# Patient Record
Sex: Male | Born: 1950 | Race: White | Hispanic: No | Marital: Married | Smoking: Never smoker
Health system: Southern US, Community
[De-identification: ages and names within clinical notes are randomized; demographics above are authoritative.]

## PROBLEM LIST (undated history)

## (undated) DIAGNOSIS — Z8619 Personal history of other infectious and parasitic diseases: Secondary | ICD-10-CM

## (undated) DIAGNOSIS — I2510 Atherosclerotic heart disease of native coronary artery without angina pectoris: Secondary | ICD-10-CM

## (undated) DIAGNOSIS — E785 Hyperlipidemia, unspecified: Secondary | ICD-10-CM

## (undated) DIAGNOSIS — I10 Essential (primary) hypertension: Secondary | ICD-10-CM

## (undated) HISTORY — DX: Personal history of other infectious and parasitic diseases: Z86.19

## (undated) HISTORY — DX: Essential (primary) hypertension: I10

## (undated) HISTORY — DX: Hyperlipidemia, unspecified: E78.5

## (undated) HISTORY — DX: Atherosclerotic heart disease of native coronary artery without angina pectoris: I25.10

---

## 2003-07-17 DIAGNOSIS — K219 Gastro-esophageal reflux disease without esophagitis: Secondary | ICD-10-CM | POA: Insufficient documentation

## 2005-09-04 ENCOUNTER — Ambulatory Visit: Payer: Self-pay | Admitting: Family Medicine

## 2006-07-16 HISTORY — PX: OTHER SURGICAL HISTORY: SHX169

## 2006-11-11 ENCOUNTER — Other Ambulatory Visit: Payer: Self-pay

## 2006-11-11 ENCOUNTER — Inpatient Hospital Stay: Payer: Self-pay | Admitting: Internal Medicine

## 2006-11-11 DIAGNOSIS — I2510 Atherosclerotic heart disease of native coronary artery without angina pectoris: Secondary | ICD-10-CM | POA: Insufficient documentation

## 2006-11-12 ENCOUNTER — Other Ambulatory Visit: Payer: Self-pay

## 2006-11-27 DIAGNOSIS — E7800 Pure hypercholesterolemia, unspecified: Secondary | ICD-10-CM | POA: Insufficient documentation

## 2006-11-27 DIAGNOSIS — I252 Old myocardial infarction: Secondary | ICD-10-CM | POA: Insufficient documentation

## 2006-12-11 ENCOUNTER — Encounter: Payer: Self-pay | Admitting: Cardiology

## 2006-12-15 ENCOUNTER — Encounter: Payer: Self-pay | Admitting: Cardiology

## 2007-01-14 ENCOUNTER — Encounter: Payer: Self-pay | Admitting: Cardiology

## 2007-02-14 ENCOUNTER — Encounter: Payer: Self-pay | Admitting: Cardiology

## 2007-03-17 ENCOUNTER — Encounter: Payer: Self-pay | Admitting: Cardiology

## 2009-04-12 ENCOUNTER — Ambulatory Visit: Payer: Self-pay | Admitting: Podiatrist

## 2009-04-18 ENCOUNTER — Ambulatory Visit (HOSPITAL_BASED_OUTPATIENT_CLINIC_OR_DEPARTMENT_OTHER): Admission: RE | Admit: 2009-04-18 | Discharge: 2009-04-19 | Payer: Self-pay | Admitting: Orthopedic Surgery

## 2009-06-25 ENCOUNTER — Emergency Department: Payer: Self-pay | Admitting: Emergency Medicine

## 2009-07-13 ENCOUNTER — Inpatient Hospital Stay (HOSPITAL_COMMUNITY): Admission: EM | Admit: 2009-07-13 | Discharge: 2009-07-15 | Payer: Self-pay | Admitting: Family Medicine

## 2009-07-13 ENCOUNTER — Ambulatory Visit: Payer: Self-pay | Admitting: Vascular Surgery

## 2009-07-27 ENCOUNTER — Ambulatory Visit: Payer: Self-pay | Admitting: Vascular Surgery

## 2009-08-10 ENCOUNTER — Ambulatory Visit: Payer: Self-pay | Admitting: Vascular Surgery

## 2009-10-09 IMAGING — CT CT EXTREM LOW W/O CM*L*
1 series · 12 of 14 positions shown, 15 images · non-contrast
Comparison: none

REASON FOR EXAM: [phone]  L calcaneal fracture
COMMENTS:

[Series 2: axial · axial · 0.39mm/px · z∈[-18,+66]mm · 12 of 34 slices shown, 15 images]
[im 3/34  soft-tissue]
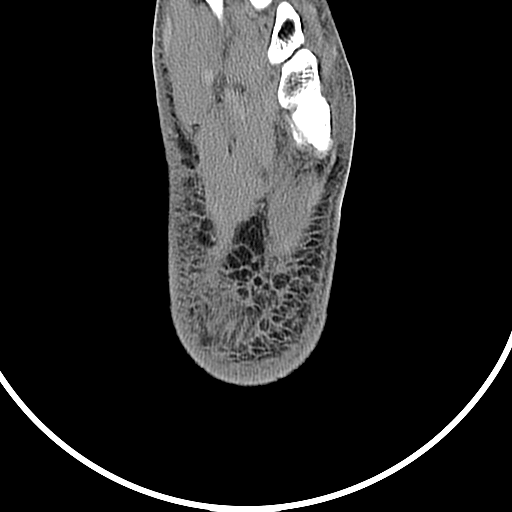
[im 3/34  bone]
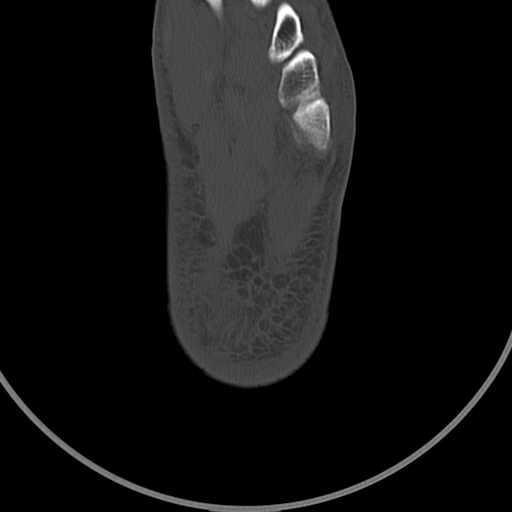
[im 6/34  bone]
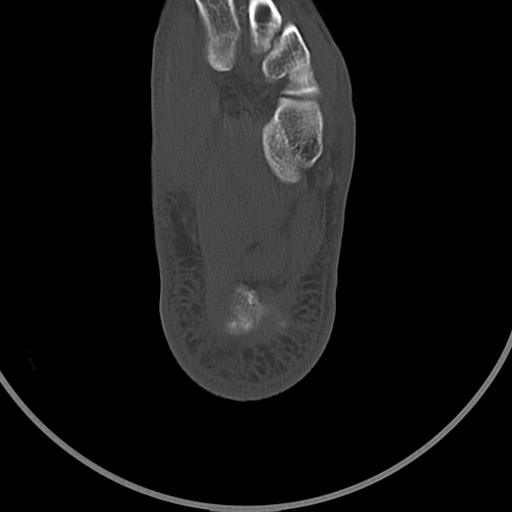
[im 8/34  bone]
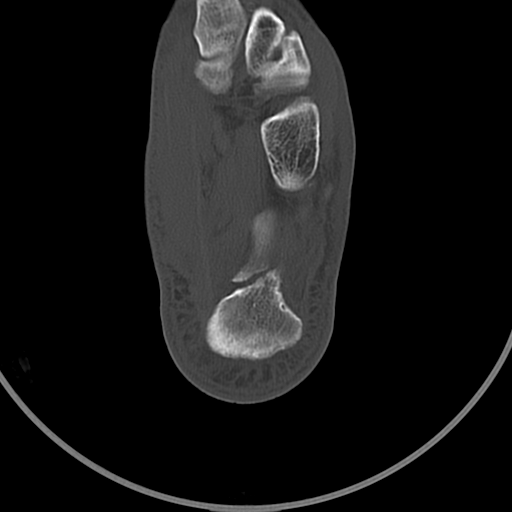
[im 11/34  bone]
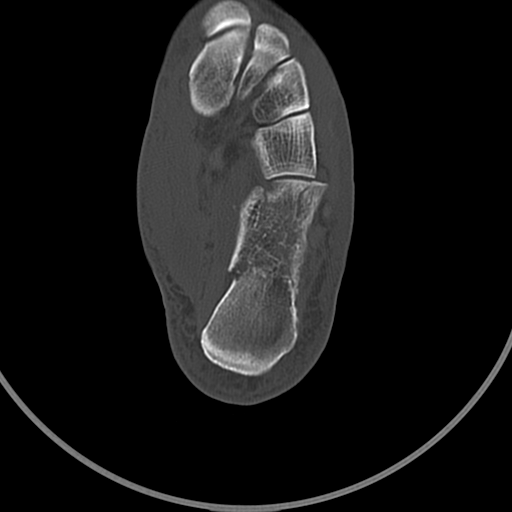
[im 13/34  soft-tissue]
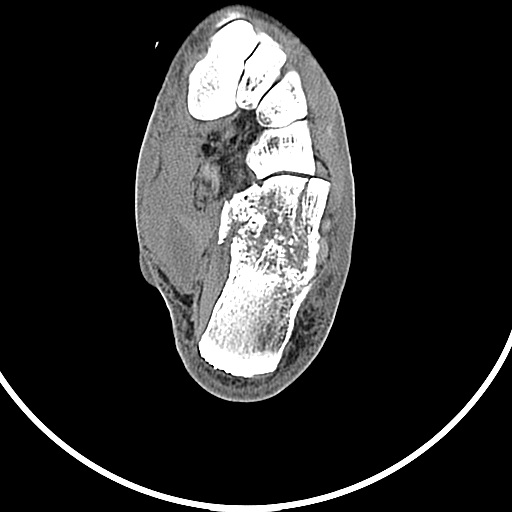
[im 13/34  bone]
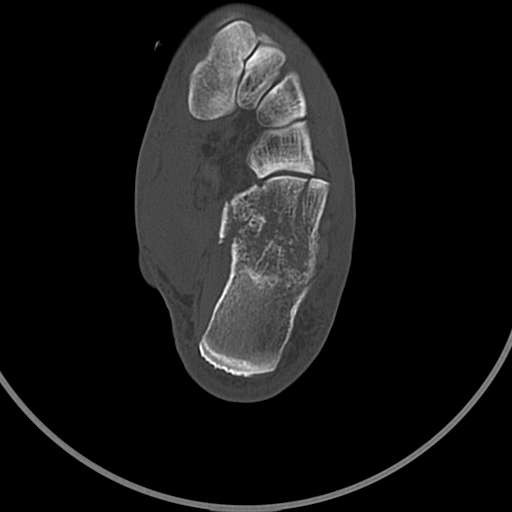
[im 16/34  bone]
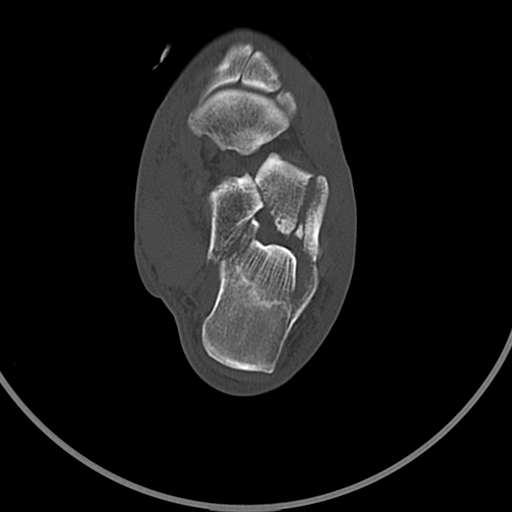
[im 18/34  bone]
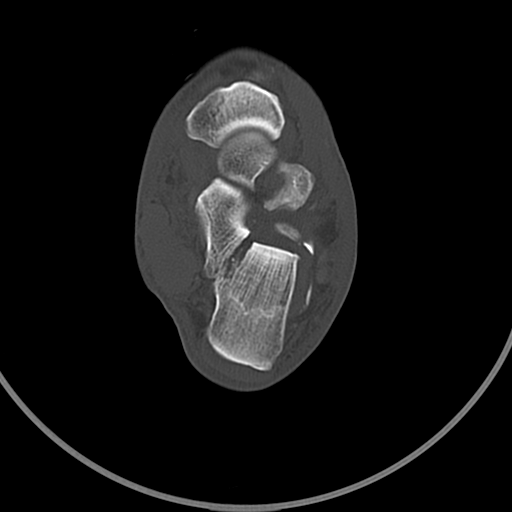
[im 21/34  bone]
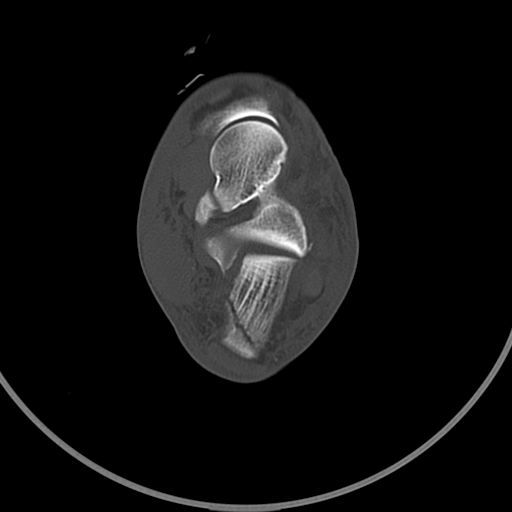
[im 23/34  soft-tissue]
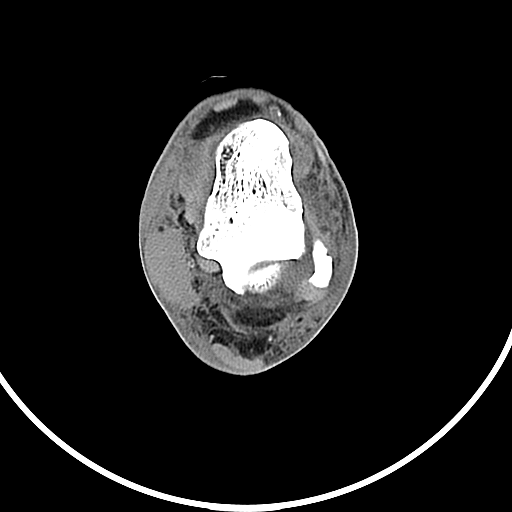
[im 23/34  bone]
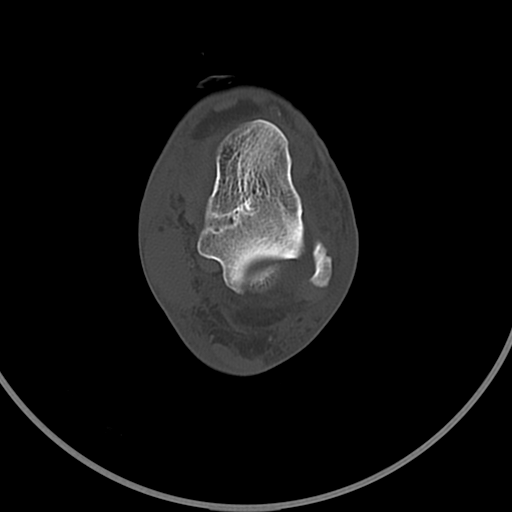
[im 26/34  bone]
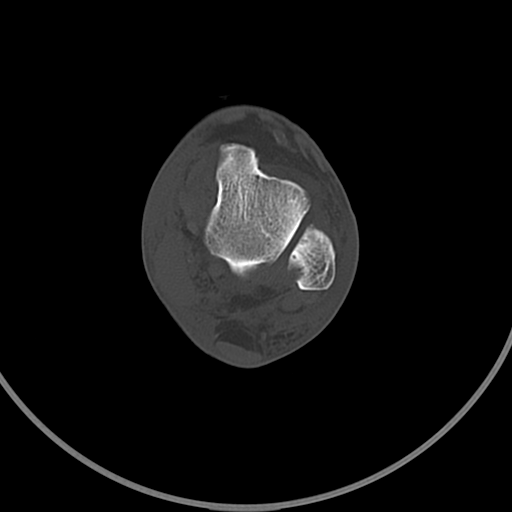
[im 28/34  bone]
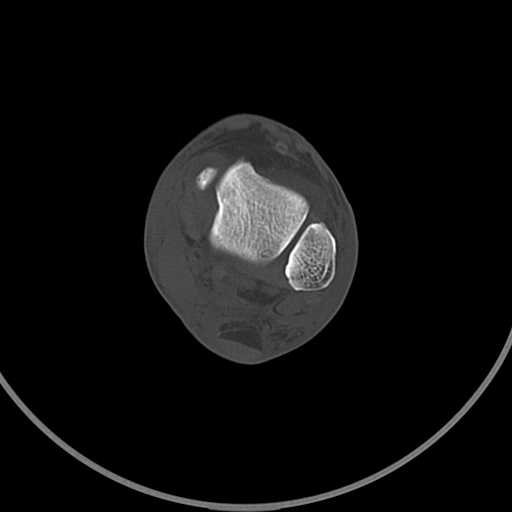
[im 31/34  bone]
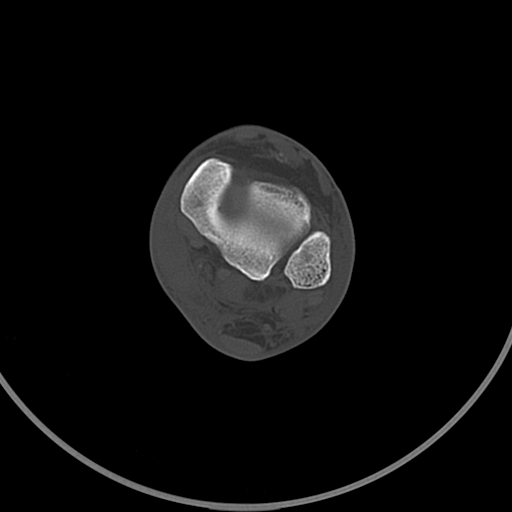

[12 of 14 positions shown; findings below may reference images not displayed]

PROCEDURE:     CT  - CT HEEL (CALCANEOUS) L WO  - April 12, 2009  [DATE]

RESULT:     Axial, sagittal, and coronal images through the heel are
reviewed.

There is a comminuted fracture of the calcaneus. There is an oblique
fracture line through the posterior aspect of the calcaneus. There is a
fracture line through the anterior calcaneal process and another through the
sustentaculum. A complex fracture through the body of the calcaneus is seen.
There is a fracture involving the posterior facet of the calcaneus. The
adjacent talus appears intact as do the tarsal navicular and the cuboid.
There is a hematoma superficial to the flexor tendons along the medial
aspect of the calcaneus.

There is an osteochondral defect of the medial aspect of the talar dome with
subchondral cyst formation.
IMPRESSION: 1. The patient has sustained a complex multipart fracture of the calcaneus.
I do not see evidence of an acute fracture of the talus.
2. There is an osteochondral defect along the medial aspect of the talar
dome with subchondral cyst formation.

## 2010-10-16 LAB — CBC
HCT: 27.4 % — ABNORMAL LOW (ref 39.0–52.0)
HCT: 28.2 % — ABNORMAL LOW (ref 39.0–52.0)
HCT: 31.8 % — ABNORMAL LOW (ref 39.0–52.0)
Hemoglobin: 11.1 g/dL — ABNORMAL LOW (ref 13.0–17.0)
Hemoglobin: 9.3 g/dL — ABNORMAL LOW (ref 13.0–17.0)
MCHC: 34 g/dL (ref 30.0–36.0)
MCV: 88.4 fL (ref 78.0–100.0)
MCV: 89.3 fL (ref 78.0–100.0)
Platelets: 146 10*3/uL — ABNORMAL LOW (ref 150–400)
Platelets: 155 10*3/uL (ref 150–400)
Platelets: 184 10*3/uL (ref 150–400)
RDW: 12.7 % (ref 11.5–15.5)
RDW: 12.8 % (ref 11.5–15.5)
RDW: 13.1 % (ref 11.5–15.5)

## 2010-10-16 LAB — TYPE AND SCREEN
ABO/RH(D): B POS
Antibody Screen: NEGATIVE

## 2010-10-16 LAB — BASIC METABOLIC PANEL
BUN: 7 mg/dL (ref 6–23)
BUN: 8 mg/dL (ref 6–23)
BUN: 9 mg/dL (ref 6–23)
CO2: 26 mEq/L (ref 19–32)
Calcium: 8.9 mg/dL (ref 8.4–10.5)
Chloride: 105 mEq/L (ref 96–112)
Chloride: 106 mEq/L (ref 96–112)
Creatinine, Ser: 0.9 mg/dL (ref 0.4–1.5)
GFR calc non Af Amer: >60 mL/min (ref >60)
GFR calc non Af Amer: >60 mL/min (ref >60)
Glucose, Bld: 121 mg/dL — ABNORMAL HIGH (ref 70–99)
Glucose, Bld: 159 mg/dL — ABNORMAL HIGH (ref 70–99)
Glucose, Bld: 96 mg/dL (ref 70–99)
Potassium: 3.6 mEq/L (ref 3.5–5.1)
Potassium: 3.9 mEq/L (ref 3.5–5.1)
Potassium: 3.9 mEq/L (ref 3.5–5.1)
Sodium: 137 mEq/L (ref 135–145)
Sodium: 140 mEq/L (ref 135–145)

## 2010-10-16 LAB — DIFFERENTIAL
Eosinophils Absolute: 0.1 10*3/uL (ref 0.0–0.7)
Eosinophils Relative: 1 % (ref 0–5)
Lymphocytes Relative: 23 % (ref 12–46)
Lymphs Abs: 3.2 10*3/uL (ref 0.7–4.0)
Monocytes Absolute: 1 10*3/uL (ref 0.1–1.0)

## 2010-10-16 LAB — PROTIME-INR: Prothrombin Time: 12.8 seconds (ref 11.6–15.2)

## 2010-10-16 LAB — TISSUE CULTURE

## 2010-10-16 LAB — ABO/RH: ABO/RH(D): B POS

## 2010-10-19 LAB — BASIC METABOLIC PANEL
BUN: 15 mg/dL (ref 6–23)
GFR calc Af Amer: >60 mL/min (ref >60)
GFR calc non Af Amer: >60 mL/min (ref >60)
Potassium: 4.1 mEq/L (ref 3.5–5.1)

## 2010-10-19 LAB — POCT HEMOGLOBIN-HEMACUE: Hemoglobin: 13.8 g/dL (ref 13.0–17.0)

## 2010-11-28 NOTE — Assessment & Plan Note (Signed)
OFFICE VISIT   Derrick Stone, Derrick Stone  DOB:  04/26/1951                                       07/27/2009  CHART#:[id number]   The patient returns for followup today.  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  On exam today, the  incision is healing well.  There is a small area of separation in the  central portion and there has been a small amount of serous drainage  from this but overall the incision looks like it is healing well.  There  is no surrounding erythema.  There is no pulsatile mass within it at  this point.  He has been doing physical therapy and recovering some from  his previous calcaneus fracture.  Overall he appears well.  He will  follow up with me in 2 weeks to consider suture removal at that time.     Janetta Hora. Fields, MD  Electronically Signed   CEF/MEDQ  D:  07/27/2009  T:  07/28/2009  Job:  [id number]   cc:   Toni Arthurs, MD

## 2010-11-28 NOTE — Assessment & Plan Note (Signed)
OFFICE VISIT   Derrick Stone, Derrick Stone  DOB:  July 02, 1951                                       08/10/2009  CHART#:[id number]   The patient returns for follow-up today.  Previous underwent ligation of  his left posterior tibial artery for tibial artery pseudoaneurysm.  On  exam today the incision is essentially healed.  There is a small central  area of separation from where the skin edges had necrosed.  Overall the  wound appears to be healing well and should be completely healed within  a few weeks.  Sutures were removed today.  He has had no further  episodes of bleeding.  There is no pulsatile mass in this area at this  point.  He will follow up on an as-needed basis.     Janetta Hora. Fields, MD  Electronically Signed   CEF/MEDQ  D:  08/10/2009  T:  08/11/2009  Job:  [id number]   cc:   Toni Arthurs, MD

## 2013-10-09 DIAGNOSIS — I10 Essential (primary) hypertension: Secondary | ICD-10-CM | POA: Insufficient documentation

## 2013-10-27 LAB — HM COLONOSCOPY

## 2014-07-16 HISTORY — PX: HERNIA REPAIR: SHX51

## 2017-04-10 DIAGNOSIS — K222 Esophageal obstruction: Secondary | ICD-10-CM | POA: Insufficient documentation

## 2017-04-12 ENCOUNTER — Encounter: Payer: Self-pay | Admitting: Family Medicine

## 2017-04-12 ENCOUNTER — Ambulatory Visit (INDEPENDENT_AMBULATORY_CARE_PROVIDER_SITE_OTHER): Payer: BLUE CROSS/BLUE SHIELD | Admitting: Family Medicine

## 2017-04-12 VITALS — BP 110/70 | HR 75 | Temp 98.5°F | Resp 16 | Ht 74.0 in | Wt 235.0 lb

## 2017-04-12 DIAGNOSIS — Z1159 Encounter for screening for other viral diseases: Secondary | ICD-10-CM | POA: Diagnosis not present

## 2017-04-12 DIAGNOSIS — Z125 Encounter for screening for malignant neoplasm of prostate: Secondary | ICD-10-CM

## 2017-04-12 DIAGNOSIS — Z23 Encounter for immunization: Secondary | ICD-10-CM

## 2017-04-12 DIAGNOSIS — E7800 Pure hypercholesterolemia, unspecified: Secondary | ICD-10-CM | POA: Diagnosis not present

## 2017-04-12 DIAGNOSIS — R739 Hyperglycemia, unspecified: Secondary | ICD-10-CM | POA: Diagnosis not present

## 2017-04-12 DIAGNOSIS — Z0000 Encounter for general adult medical examination without abnormal findings: Secondary | ICD-10-CM | POA: Diagnosis not present

## 2017-04-12 DIAGNOSIS — I2510 Atherosclerotic heart disease of native coronary artery without angina pectoris: Secondary | ICD-10-CM

## 2017-04-12 NOTE — Progress Notes (Signed)
Patient: Derrick Stone, Male    DOB: [birth year], 66 y.o.   MRN: [id number] Visit Date: 04/12/2017  Today's Provider: Lelon Huh, MD   Chief Complaint  Patient presents with  . Annual Exam  . Hyperlipidemia   Subjective:   Patient presents to re-establish care. He was last seen 07-23-2013. Have completed new medical history.    Annual physical exam Derrick Stone is a 66 y.o. male who presents today for health maintenance and complete physical. He feels well. He reports exercising none. He reports he is sleeping well.  ---------------------------------------------------------------- CAD Followed by Dr. Ubaldo Glassing. Is doing well. No chest pains or dyspnea. .  Hypercholesterolemia Followed by cardiology, lipids last check in January with LDL at 85  He did have mildly elevated fasting glucose of 111 on labs done in January by Dr. Ubaldo Glassing.    Review of Systems  Constitutional: Negative for chills, diaphoresis and fever.  HENT: Negative for congestion, ear discharge, ear pain, hearing loss, nosebleeds, sore throat and tinnitus.   Eyes: Negative for photophobia, pain, discharge and redness.  Respiratory: Negative for cough, shortness of breath, wheezing and stridor.   Cardiovascular: Negative for chest pain, palpitations and leg swelling.  Gastrointestinal: Negative for abdominal pain, blood in stool, constipation, diarrhea, nausea and vomiting.  Endocrine: Negative for polydipsia.  Genitourinary: Negative for dysuria, flank pain, frequency, hematuria and urgency.  Musculoskeletal: Negative for back pain, myalgias and neck pain.  Skin: Negative for rash.  Allergic/Immunologic: Negative for environmental allergies.  Neurological: Negative for dizziness, tremors, seizures, weakness and headaches.  Hematological: Does not bruise/bleed easily.  Psychiatric/Behavioral: Negative for hallucinations and suicidal ideas. The patient is not nervous/anxious.   All other systems reviewed and  are negative.   Social History      He  reports that he has never smoked. He has never used smokeless tobacco. He reports that he does not drink alcohol or use drugs.       Social History   Social History  . Marital status: Married    Spouse name: N/A  . Number of children: 2  . Years of education: N/A   Occupational History  .      Part time with [employer] with father-in-law   Social History Main Topics  . Smoking status: Never Smoker  . Smokeless tobacco: Never Used  . Alcohol use No  . Drug use: No  . Sexual activity: Not Asked   Other Topics Concern  . None   Social History Narrative  . None    Past Medical History:  Diagnosis Date  . CAD (coronary artery disease)   . History of chickenpox   . History of mumps   . Hyperlipidemia      Patient Active Problem List   Diagnosis Date Noted  . Stricture and stenosis of esophagus 04/10/2017  . Pure hypercholesterolemia 11/27/2006  . 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 Coronary atherosclerosis of native coronary artery 11/11/2006  . Acid reflux 07/17/2003    Past Surgical History:  Procedure Laterality Date  . Heart artery stent  2008   x2    Family History        Family Status  Relation Status  . Mother Alive  . Father Alive       pre-diabetic  . Brother Alive        His family history includes Melanoma in his mother; Testicular cancer in his brother.     No Known Allergies  Current Outpatient Prescriptions:  .  atorvastatin (LIPITOR) 20 MG tablet, Take 1 tablet by mouth daily., Disp: , Rfl:  .  clopidogrel (PLAVIX) 75 MG tablet, Take 1 tablet by mouth daily., Disp: , Rfl:  .  metoprolol succinate (TOPROL XL) 50 MG 24 hr tablet, Take 1 tablet by mouth daily., Disp: , Rfl:  .  Omega-3 Fatty Acids (FISH OIL PO), Take 1 capsule by mouth daily., Disp: , Rfl:  .  omeprazole (PRILOSEC OTC) 20 MG tablet, Take 1 tablet by mouth daily., Disp: , Rfl:    Patient Care Team: Birdie Sons, MD as  PCP - General (Family Medicine) Teodoro Spray, MD as Consulting Physician (Cardiology) Ralene Bathe, MD (Dermatology) Lorelee Cover., MD (Ophthalmology)      Objective:   Vitals: BP 110/70 (BP Location: Right Arm, Patient Position: Sitting, Cuff Size: Large)   Pulse 75   Temp 98.5 F (36.9 C) (Oral)   Resp 16   Ht 6\' 2"  (1.88 m)   Wt 235 lb (106.6 kg)   SpO2 98%   BMI 30.17 kg/m    Vitals:   04/12/17 1403  BP: 110/70  Pulse: 75  Resp: 16  Temp: 98.5 F (36.9 C)  TempSrc: Oral  SpO2: 98%  Weight: 235 lb (106.6 kg)  Height: 6\' 2"  (1.88 m)     Physical Exam   General Appearance:    Alert, cooperative, no distress, appears stated age  Head:    Normocephalic, without obvious abnormality, atraumatic  Eyes:    PERRL, conjunctiva/corneas clear, EOM's intact, fundi    benign, both eyes       Ears:    Normal TM's and external ear canals, both ears  Nose:   Nares normal, septum midline, mucosa normal, no drainage   or sinus tenderness  Throat:   Lips, mucosa, and tongue normal; teeth and gums normal  Neck:   Supple, symmetrical, trachea midline, no adenopathy;       thyroid:  No enlargement/tenderness/nodules; no carotid   bruit or JVD  Back:     Symmetric, no curvature, ROM normal, no CVA tenderness  Lungs:     Clear to auscultation bilaterally, respirations unlabored  Chest wall:    No tenderness or deformity  Heart:    Regular rate and rhythm, S1 and S2 normal, no murmur, rub   or gallop  Abdomen:     Soft, non-tender, bowel sounds active all four quadrants,    no masses, no organomegaly  Genitalia:    deferred  Rectal:    deferred  Extremities:   Extremities normal, atraumatic, no cyanosis or edema  Pulses:   2+ and symmetric all extremities  Skin:   Skin color, texture, turgor normal, no rashes or lesions  Lymph nodes:   Cervical, supraclavicular, and axillary nodes normal  Neurologic:   CNII-XII intact. Normal strength, sensation and reflexes       throughout   Depression Screen PHQ 2/9 Scores 04/12/2017  PHQ - 2 Score 0  PHQ- 9 Score 0      Assessment & Plan:     Routine Health Maintenance and Physical Exam  Exercise Activities and Dietary recommendations Goals    None      Immunization History  Administered Date(s) Administered  . Tdap 04/04/2009  . Zoster 07/23/2013    Health Maintenance  Topic Date Due  . Hepatitis C Screening  1951/01/04  . HIV Screening  06/06/1968  . INFLUENZA VACCINE  02/13/2018 (Originally 02/13/2017)  .  TETANUS/TDAP  04/05/2019  . COLONOSCOPY  10/28/2023     Discussed health benefits of physical activity, and encouraged him to engage in regular exercise appropriate for his age and condition.    --------------------------------------------------------------------  1. Annual physical exam Generally doing well, is mildly obese and is to work on losing weight with improving diet and increasing exercise.   2. Atherosclerosis of native coronary artery of native heart without angina pectoris Asymptomatic. Compliant with medication.  Continue aggressive risk factor modification.  Continue regular follow up Dr. Ubaldo Glassing.   3. Pure hypercholesterolemia He is tolerating atorvastatin well with no adverse effects.    4. Need for influenza vaccination Flu vaccine  5. Need for shingles vaccine Shingrix #1  6. Hyperglycemia  - Hemoglobin A1c  7. Prostate cancer screening  - PSA  8. Need for hepatitis C screening test  - Hepatitis C antibody   Lelon Huh, MD  [facility]

## 2017-04-13 ENCOUNTER — Encounter: Payer: Self-pay | Admitting: Family Medicine

## 2017-04-13 DIAGNOSIS — R7303 Prediabetes: Secondary | ICD-10-CM | POA: Insufficient documentation

## 2017-04-13 LAB — HEMOGLOBIN A1C
Hgb A1c MFr Bld: 6 % of total Hgb — ABNORMAL HIGH (ref <5.7)
Mean Plasma Glucose: 126 (calc)
eAG (mmol/L): 7 (calc)

## 2017-04-13 LAB — HEPATITIS C ANTIBODY
Hepatitis C Ab: NONREACTIVE
SIGNAL TO CUT-OFF: 0.01 (ref <1.00)

## 2017-04-13 LAB — PSA: PSA: 0.5 ng/mL (ref <=4.0)

## 2017-04-15 ENCOUNTER — Telehealth: Payer: Self-pay

## 2017-04-15 ENCOUNTER — Encounter: Payer: Self-pay | Admitting: Family Medicine

## 2017-04-15 NOTE — Telephone Encounter (Signed)
-----   Message from Birdie Sons, MD sent at 04/13/2017  8:30 AM EDT ----- PSA is normal. Average blood sugar is elevated at 126, which is 'Pre-diabetes' need to avoid sweets and starchy foods, exercise 150 minutes a week. Try to lose 10-15 pounds over the next 6 months. Need to check A1c yearly.

## 2017-04-15 NOTE — Telephone Encounter (Signed)
Patient advised.KW 

## 2017-04-15 NOTE — Telephone Encounter (Signed)
[id number] 

## 2017-06-12 ENCOUNTER — Ambulatory Visit (INDEPENDENT_AMBULATORY_CARE_PROVIDER_SITE_OTHER): Payer: BLUE CROSS/BLUE SHIELD | Admitting: Family Medicine

## 2017-06-12 DIAGNOSIS — Z23 Encounter for immunization: Secondary | ICD-10-CM | POA: Diagnosis not present

## 2017-06-16 NOTE — Progress Notes (Signed)
Vaccine administration only. No E&M service today.   

## 2017-06-19 ENCOUNTER — Encounter: Payer: Self-pay | Admitting: Family Medicine

## 2017-06-19 ENCOUNTER — Ambulatory Visit: Payer: BLUE CROSS/BLUE SHIELD | Admitting: Family Medicine

## 2017-06-19 VITALS — BP 126/70 | HR 69 | Temp 98.6°F | Resp 15 | Wt 224.8 lb

## 2017-06-19 DIAGNOSIS — R05 Cough: Secondary | ICD-10-CM | POA: Diagnosis not present

## 2017-06-19 DIAGNOSIS — J40 Bronchitis, not specified as acute or chronic: Secondary | ICD-10-CM | POA: Diagnosis not present

## 2017-06-19 DIAGNOSIS — R059 Cough, unspecified: Secondary | ICD-10-CM

## 2017-06-19 MED ORDER — AZITHROMYCIN 250 MG PO TABS
ORAL_TABLET | ORAL | 0 refills | Status: AC
Start: 2017-06-19 — End: 2017-06-24

## 2017-06-19 MED ORDER — HYDROCODONE-HOMATROPINE 5-1.5 MG/5ML PO SYRP: 5.0000 mL | ORAL_SOLUTION | Freq: Three times a day (TID) | ORAL | 0 refills | Status: DC | PRN

## 2017-06-19 NOTE — Progress Notes (Addendum)
Patient ID: Derrick Stone, male   DOB: 1951-02-05, 66 y.o.   MRN: [id number]        Patient: Derrick Stone Male    DOB: November 04, 1950   66 y.o.   MRN: [id number] Visit Date: 06/19/2017  Today's Provider: Lelon Huh, MD   Chief Complaint  Patient presents with  . URI   Subjective:    URI   This is a new problem. The current episode started in the past 7 days. The problem has been unchanged. There has been no fever. Associated symptoms include congestion, coughing and rhinorrhea. Pertinent negatives include no abdominal pain, chest pain, diarrhea, dysuria, ear pain, headaches, joint pain, joint swelling, nausea, neck pain, plugged ear sensation, rash, sinus pain, sneezing, sore throat, swollen glands, vomiting or wheezing. He has tried decongestant and increased fluids for the symptoms. The treatment provided no relief.       Allergies  Allergen Reactions  . Nitroglycerin Other (See Comments)    Passed out when given to him once     Current Outpatient Medications:  .  aspirin EC 81 MG tablet, Take by mouth., Disp: , Rfl:  .  atorvastatin (LIPITOR) 40 MG tablet, TAKE 1 TABLET (40 MG TOTAL) BY MOUTH ONCE DAILY., Disp: , Rfl: 5 .  clopidogrel (PLAVIX) 75 MG tablet, Take 1 tablet by mouth daily., Disp: , Rfl:  .  escitalopram (LEXAPRO) 10 MG tablet, TAKE 1 TABLET (10 MG TOTAL) BY MOUTH ONCE DAILY., Disp: , Rfl: 1 .  metoprolol succinate (TOPROL XL) 50 MG 24 hr tablet, Take 1 tablet by mouth daily., Disp: , Rfl:  .  Omega-3 Fatty Acids (FISH OIL PO), Take 1 capsule by mouth daily., Disp: , Rfl:  .  omeprazole (PRILOSEC OTC) 20 MG tablet, Take 1 tablet by mouth daily., Disp: , Rfl:  .  omeprazole (PRILOSEC) 20 MG capsule, TAKE 1 CAPSULE (20 MG TOTAL) BY MOUTH ONCE DAILY., Disp: , Rfl:  .  mometasone (NASONEX) 50 MCG/ACT nasal spray, Place into the nose., Disp: , Rfl:   Review of Systems  HENT: Positive for congestion and rhinorrhea. Negative for ear pain, sinus pain, sneezing and  sore throat.   Respiratory: Positive for cough. Negative for wheezing.   Cardiovascular: Negative for chest pain.  Gastrointestinal: Negative for abdominal pain, diarrhea, nausea and vomiting.  Genitourinary: Negative for dysuria.  Musculoskeletal: Negative for joint pain and neck pain.  Skin: Negative for rash.  Neurological: Negative for headaches.    Social History   Tobacco Use  . Smoking status: Never Smoker  . Smokeless tobacco: Never Used  Substance Use Topics  . Alcohol use: No   Objective:   BP 126/70   Pulse 69   Temp 98.6 F (37 C) (Oral)   Resp 15   Wt 224 lb 12.8 oz (102 kg)   SpO2 99%   BMI 28.86 kg/m  Vitals:   06/19/17 1526  BP: 126/70  Pulse: 69  Resp: 15  Temp: 98.6 F (37 C)  TempSrc: Oral  SpO2: 99%  Weight: 224 lb 12.8 oz (102 kg)     Physical Exam  General Appearance:    Alert, cooperative, no distress  HENT:   ENT exam normal, no neck nodes or sinus tenderness  Eyes:    PERRL, conjunctiva/corneas clear, EOM's intact       Lungs:     Occasional expiratory wheeze, no rales, , respirations unlabored  Heart:    Regular rate and rhythm  Neurologic:  Awake, alert, oriented x 3. No apparent focal neurological           defect.           Assessment & Plan:     1. Cough  - HYDROcodone-homatropine (HYCODAN) 5-1.5 MG/5ML syrup; Take 5 mLs by mouth every 8 (eight) hours as needed for cough.  Dispense: 100 mL; Refill: 0  2. Bronchitis  - azithromycin (ZITHROMAX) 250 MG tablet; 2 by mouth today, then 1 daily for 4 days  Dispense: 6 tablet; Refill: 0  Call if symptoms change or if not rapidly improving.          Lelon Huh, MD  [facility]

## 2017-06-19 NOTE — Progress Notes (Deleted)
Subjective:     Patient ID: Derrick Stone, male   DOB: October 16, 1950, 66 y.o.   MRN: [id number]  URI   This is a new problem. The current episode started in the past 7 days. The problem has been unchanged. There has been no fever. Associated symptoms include congestion, coughing and rhinorrhea. Pertinent negatives include no abdominal pain, chest pain, diarrhea, dysuria, ear pain, headaches, joint pain, joint swelling, nausea, neck pain, plugged ear sensation, rash, sinus pain, sneezing, sore throat, swollen glands, vomiting or wheezing. He has tried decongestant and increased fluids for the symptoms. The treatment provided no relief.     Review of Systems  HENT: Positive for congestion and rhinorrhea. Negative for ear pain, sinus pain, sneezing and sore throat.   Respiratory: Positive for cough. Negative for wheezing.   Cardiovascular: Negative for chest pain.  Gastrointestinal: Negative for abdominal pain, diarrhea, nausea and vomiting.  Genitourinary: Negative for dysuria.  Musculoskeletal: Negative for joint pain and neck pain.  Skin: Negative for rash.  Neurological: Negative for headaches.       Objective:   Physical Exam     Assessment:     ***    Plan:     ***

## 2018-08-08 ENCOUNTER — Ambulatory Visit (INDEPENDENT_AMBULATORY_CARE_PROVIDER_SITE_OTHER): Payer: Medicare Other | Admitting: Family Medicine

## 2018-08-08 DIAGNOSIS — R7303 Prediabetes: Secondary | ICD-10-CM | POA: Diagnosis not present

## 2018-08-08 DIAGNOSIS — I25110 Atherosclerotic heart disease of native coronary artery with unstable angina pectoris: Secondary | ICD-10-CM | POA: Diagnosis not present

## 2018-08-08 DIAGNOSIS — I25810 Atherosclerosis of coronary artery bypass graft(s) without angina pectoris: Secondary | ICD-10-CM | POA: Diagnosis not present

## 2018-08-08 DIAGNOSIS — I10 Essential (primary) hypertension: Secondary | ICD-10-CM | POA: Diagnosis not present

## 2018-08-08 DIAGNOSIS — F418 Other specified anxiety disorders: Secondary | ICD-10-CM | POA: Diagnosis not present

## 2018-08-08 DIAGNOSIS — E782 Mixed hyperlipidemia: Secondary | ICD-10-CM | POA: Diagnosis not present

## 2018-08-08 DIAGNOSIS — Z23 Encounter for immunization: Secondary | ICD-10-CM | POA: Diagnosis not present

## 2018-08-08 DIAGNOSIS — E7800 Pure hypercholesterolemia, unspecified: Secondary | ICD-10-CM | POA: Diagnosis not present

## 2018-08-08 DIAGNOSIS — I2510 Atherosclerotic heart disease of native coronary artery without angina pectoris: Secondary | ICD-10-CM | POA: Diagnosis not present

## 2018-08-08 NOTE — Progress Notes (Signed)
Fluzon

## 2018-08-26 ENCOUNTER — Encounter: Payer: BLUE CROSS/BLUE SHIELD | Admitting: Family Medicine

## 2018-08-29 ENCOUNTER — Ambulatory Visit (INDEPENDENT_AMBULATORY_CARE_PROVIDER_SITE_OTHER): Payer: Medicare Other | Admitting: Family Medicine

## 2018-08-29 ENCOUNTER — Encounter: Payer: Self-pay | Admitting: Family Medicine

## 2018-08-29 VITALS — BP 116/78 | HR 68 | Temp 98.1°F | Resp 16 | Ht 73.0 in | Wt 233.0 lb

## 2018-08-29 DIAGNOSIS — I10 Essential (primary) hypertension: Secondary | ICD-10-CM

## 2018-08-29 DIAGNOSIS — R7303 Prediabetes: Secondary | ICD-10-CM | POA: Diagnosis not present

## 2018-08-29 DIAGNOSIS — I2510 Atherosclerotic heart disease of native coronary artery without angina pectoris: Secondary | ICD-10-CM | POA: Diagnosis not present

## 2018-08-29 DIAGNOSIS — Z0000 Encounter for general adult medical examination without abnormal findings: Secondary | ICD-10-CM | POA: Diagnosis not present

## 2018-08-29 DIAGNOSIS — K219 Gastro-esophageal reflux disease without esophagitis: Secondary | ICD-10-CM

## 2018-08-29 DIAGNOSIS — E7800 Pure hypercholesterolemia, unspecified: Secondary | ICD-10-CM | POA: Diagnosis not present

## 2018-08-29 DIAGNOSIS — Z23 Encounter for immunization: Secondary | ICD-10-CM | POA: Diagnosis not present

## 2018-08-29 DIAGNOSIS — Z125 Encounter for screening for malignant neoplasm of prostate: Secondary | ICD-10-CM

## 2018-08-29 NOTE — Progress Notes (Signed)
Patient: Derrick Stone, Male    DOB: [birth year], 68 y.o.   MRN: [id number] Visit Date: 08/29/2018  Today's Provider: Lelon Huh, MD   Chief Complaint  Patient presents with  . Medicare Wellness   Subjective:    Initial preventative physical exam Derrick Stone is a 68 y.o. male who presents today for his Initial Preventative Physical Exam. He feels well. He reports exercising most days. He reports he is sleeping fairly well.   Review of Systems  Constitutional: Negative for chills, diaphoresis and fever.  HENT: Negative for congestion, ear discharge, ear pain, hearing loss, nosebleeds, sore throat and tinnitus.   Eyes: Negative for photophobia, pain, discharge and redness.  Respiratory: Negative for cough, shortness of breath, wheezing and stridor.   Cardiovascular: Negative for chest pain, palpitations and leg swelling.  Gastrointestinal: Negative for abdominal pain, blood in stool, constipation, diarrhea, nausea and vomiting.  Endocrine: Negative for polydipsia.  Genitourinary: Negative for dysuria, flank pain, frequency, hematuria and urgency.  Musculoskeletal: Negative for back pain and neck pain.       Left arm pain from time to time  Skin: Negative for rash.  Allergic/Immunologic: Negative for environmental allergies.  Neurological: Negative for dizziness, tremors, seizures, weakness and headaches.  Hematological: Does not bruise/bleed easily.  Psychiatric/Behavioral: Negative for hallucinations and suicidal ideas. The patient is not nervous/anxious.     Social History   Socioeconomic History  . Marital status: Married    Spouse name: Not on file  . Number of children: 2  . Years of education: Not on file  . 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 Financial resource strain: Not on file  . Food insecurity:    Worry: Not on file    Inability: Not on file  .  Transportation needs:    Medical: Not on file    Non-medical: Not on file  Tobacco Use  . Smoking status: Never Smoker  . Smokeless tobacco: Never Used  Substance and Sexual Activity  . Alcohol use: No  . Drug use: No  . Sexual activity: Not on file  Lifestyle  . Physical activity:    Days per week: Not on file    Minutes per session: Not on file  . Stress: Not on file  Relationships  . Social connections:    Talks on phone: Not on file    Gets together: Not on file    Attends religious service: Not on file    Active member of club or organization: Not on file    Attends meetings of clubs or organizations: Not on file    Relationship status: Not on file  . Intimate partner violence:    Fear of current or ex partner: Not on file    Emotionally abused: Not on file    Physically abused: Not on file    Forced sexual activity: Not on file  Other Topics Concern  . Not on file  Social History Narrative  . Not on file    Past Medical History:  Diagnosis Date  . CAD (coronary artery disease)   . History of chickenpox   . History of mumps      Patient Active Problem List   Diagnosis Date Noted  . Pre-diabetes 04/13/2017  . Stricture and stenosis of esophagus 04/10/2017  . Benign essential hypertension 10/09/2013  . Pure hypercholesterolemia 11/27/2006  .  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 Coronary atherosclerosis of native coronary artery 11/11/2006  . Acid reflux 07/17/2003    Past Surgical History:  Procedure Laterality Date  . Heart artery stent  2008   x2  . HERNIA REPAIR  2016    His family history includes Bone cancer in his father; Diabetes in his father; Heart disease in his paternal grandfather; Melanoma in his mother; Testicular cancer in his brother.   Current Outpatient Medications:  .  aspirin EC 81 MG tablet, Take by mouth., Disp: , Rfl:  .  atorvastatin (LIPITOR) 40 MG tablet, TAKE 1 TABLET (40 MG TOTAL) BY MOUTH ONCE DAILY., Disp: , Rfl: 5 .   clopidogrel (PLAVIX) 75 MG tablet, Take 1 tablet by mouth daily., Disp: , Rfl:  .  escitalopram (LEXAPRO) 10 MG tablet, TAKE 1 TABLET (10 MG TOTAL) BY MOUTH ONCE DAILY., Disp: , Rfl: 1 .  metoprolol succinate (TOPROL XL) 50 MG 24 hr tablet, Take 1 tablet by mouth daily., Disp: , Rfl:  .  mometasone (NASONEX) 50 MCG/ACT nasal spray, Place into the nose., Disp: , Rfl:  .  Omega-3 Fatty Acids (FISH OIL PO), Take 1 capsule by mouth daily., Disp: , Rfl:  .  omeprazole (PRILOSEC) 20 MG capsule, TAKE 1 CAPSULE (20 MG TOTAL) BY MOUTH ONCE DAILY., Disp: , Rfl:  .  HYDROcodone-homatropine (HYCODAN) 5-1.5 MG/5ML syrup, Take 5 mLs by mouth every 8 (eight) hours as needed for cough., Disp: 100 mL, Rfl: 0 .  omeprazole (PRILOSEC OTC) 20 MG tablet, Take 1 tablet by mouth daily., Disp: , Rfl:    Patient Care Team: Birdie Sons, MD as PCP - General (Family Medicine) Teodoro Spray, MD as Consulting Physician (Cardiology) Ralene Bathe, MD (Dermatology) Lorelee Cover., MD (Ophthalmology)   Objective:    Vitals: BP 116/78 (BP Location: Right Arm, Patient Position: Sitting, Cuff Size: Large)   Pulse 68   Temp 98.1 F (36.7 C) (Oral)   Resp 16   Ht 6\' 1"  (1.854 m)   Wt 233 lb (105.7 kg)   BMI 30.74 kg/m   Physical Exam  General Appearance:    Alert, cooperative, no distress  Eyes:    PERRL, conjunctiva/corneas clear, EOM's intact       Lungs:     Clear to auscultation bilaterally, respirations unlabored  Heart:    Regular rate and rhythm  Neurologic:   Awake, alert, oriented x 3. No apparent focal neurological           defect.         Visual Acuity Screening   Right eye Left eye Both eyes  Without correction:     With correction: 20/20 20/20 20/20     Activities of Daily Living In your present state of health, do you have any difficulty performing the following activities: 08/29/2018  Hearing? N  Vision? N  Difficulty concentrating or making decisions? N  Walking or climbing  stairs? N  Dressing or bathing? N  Doing errands, shopping? N  Some recent data might be hidden    Fall Risk Assessment No flowsheet data found.   Depression Screen PHQ 2/9 Scores 08/29/2018 04/12/2017  PHQ - 2 Score 0 0  PHQ- 9 Score 0 0    6CIT Screen 08/29/2018  What Year? 0 points  What month? 0 points  What time? 0 points  Count back from 20 0 points  Months in reverse 0 points  Repeat phrase 0 points  Total Score  0      Assessment & Plan:     Initial Preventative Physical Exam  Reviewed patient's Family Medical History Reviewed and updated list of patient's medical providers Assessment of cognitive impairment was done Assessed patient's functional ability Established a written schedule for health screening [facility] Completed and Reviewed  Exercise Activities and Dietary recommendations Goals   None     Immunization History  Administered Date(s) Administered  . Influenza Split 04/04/2009  . Influenza, High Dose Seasonal PF 08/08/2018  . Influenza,inj,Quad PF,6+ Mos 07/23/2013, 04/12/2017  . Tdap 04/04/2009  . Zoster 07/23/2013  . Zoster Recombinat (Shingrix) 04/12/2017, 06/12/2017    Health Maintenance  Topic Date Due  . PNA vac Low Risk Adult (1 of 2 - PCV13) 06/06/2018  . TETANUS/TDAP  04/05/2019  . COLONOSCOPY  10/28/2023  . INFLUENZA VACCINE  Completed  . Hepatitis C Screening  Completed  . HIV Screening  Discontinued     Discussed health benefits of physical activity, and encouraged him to engage in regular exercise appropriate for his age and condition.     2. Pre-diabetes  - Hemoglobin A1c  3. Benign essential hypertension Well controlled.  Continue current medications.    4. Atherosclerosis of native coronary artery of native heart without angina pectoris Asymptomatic. Compliant with medication.  Continue aggressive risk factor modification.    5. Gastroesophageal reflux disease, esophagitis presence not  specified Well controlled.  Continue current medications.    6. Need for pneumococcal vaccination  - Pneumococcal polysaccharide vaccine 23-valent greater than or equal to 2yo subcutaneous/IM  7. Pure hypercholesterolemia He is tolerating atorvastatin well with no adverse effects.   - Comprehensive metabolic panel - Lipid panel  8. Prostate cancer screening  - PSA   Lelon Huh, MD  [facility]

## 2018-08-30 LAB — HEMOGLOBIN A1C
Est. average glucose Bld gHb Est-mCnc: 123 mg/dL
Hgb A1c MFr Bld: 5.9 % — ABNORMAL HIGH (ref 4.8–5.6)

## 2018-08-30 LAB — COMPREHENSIVE METABOLIC PANEL
ALK PHOS: 42 IU/L (ref 39–117)
ALT: 22 IU/L (ref 0–44)
AST: 23 IU/L (ref 0–40)
Albumin/Globulin Ratio: 1.7 (ref 1.2–2.2)
Albumin: 4.7 g/dL (ref 3.8–4.8)
BUN/Creatinine Ratio: 14 (ref 10–24)
BUN: 15 mg/dL (ref 8–27)
Bilirubin Total: 0.5 mg/dL (ref 0.0–1.2)
CALCIUM: 9.3 mg/dL (ref 8.6–10.2)
CO2: 24 mmol/L (ref 20–29)
CREATININE: 1.08 mg/dL (ref 0.76–1.27)
Chloride: 98 mmol/L (ref 96–106)
GFR calc Af Amer: 83 mL/min/{1.73_m2} (ref >59)
GFR calc non Af Amer: 72 mL/min/{1.73_m2} (ref >59)
GLUCOSE: 81 mg/dL (ref 65–99)
Globulin, Total: 2.8 g/dL (ref 1.5–4.5)
Potassium: 4.3 mmol/L (ref 3.5–5.2)
Sodium: 137 mmol/L (ref 134–144)
Total Protein: 7.5 g/dL (ref 6.0–8.5)

## 2018-08-30 LAB — LIPID PANEL
CHOLESTEROL TOTAL: 144 mg/dL (ref 100–199)
Chol/HDL Ratio: 3.8 ratio (ref 0.0–5.0)
HDL: 38 mg/dL — AB (ref >39)
LDL CALC: 70 mg/dL (ref 0–99)
TRIGLYCERIDES: 178 mg/dL — AB (ref 0–149)
VLDL Cholesterol Cal: 36 mg/dL (ref 5–40)

## 2018-08-30 LAB — PSA: Prostate Specific Ag, Serum: 0.7 ng/mL (ref 0.0–4.0)

## 2018-09-01 NOTE — Patient Instructions (Signed)
.   Please review the attached list of medications and notify my office if there are any errors.   . Please bring all of your medications to every appointment so we can make sure that our medication list is the same as yours.   

## 2018-09-10 DIAGNOSIS — R21 Rash and other nonspecific skin eruption: Secondary | ICD-10-CM | POA: Diagnosis not present

## 2018-09-10 DIAGNOSIS — L239 Allergic contact dermatitis, unspecified cause: Secondary | ICD-10-CM | POA: Diagnosis not present

## 2018-09-10 DIAGNOSIS — L2089 Other atopic dermatitis: Secondary | ICD-10-CM | POA: Diagnosis not present

## 2018-09-12 DIAGNOSIS — R21 Rash and other nonspecific skin eruption: Secondary | ICD-10-CM | POA: Diagnosis not present

## 2018-09-17 DIAGNOSIS — R21 Rash and other nonspecific skin eruption: Secondary | ICD-10-CM | POA: Diagnosis not present

## 2018-09-17 DIAGNOSIS — L239 Allergic contact dermatitis, unspecified cause: Secondary | ICD-10-CM | POA: Diagnosis not present

## 2018-11-04 DIAGNOSIS — L853 Xerosis cutis: Secondary | ICD-10-CM | POA: Diagnosis not present

## 2018-11-04 DIAGNOSIS — L570 Actinic keratosis: Secondary | ICD-10-CM | POA: Diagnosis not present

## 2018-11-04 DIAGNOSIS — L578 Other skin changes due to chronic exposure to nonionizing radiation: Secondary | ICD-10-CM | POA: Diagnosis not present

## 2018-11-04 DIAGNOSIS — L814 Other melanin hyperpigmentation: Secondary | ICD-10-CM | POA: Diagnosis not present

## 2018-11-04 DIAGNOSIS — L821 Other seborrheic keratosis: Secondary | ICD-10-CM | POA: Diagnosis not present

## 2019-05-11 DIAGNOSIS — J301 Allergic rhinitis due to pollen: Secondary | ICD-10-CM | POA: Diagnosis not present

## 2019-05-11 DIAGNOSIS — J019 Acute sinusitis, unspecified: Secondary | ICD-10-CM | POA: Diagnosis not present

## 2019-07-30 DIAGNOSIS — I2510 Atherosclerotic heart disease of native coronary artery without angina pectoris: Secondary | ICD-10-CM | POA: Diagnosis not present

## 2019-07-30 DIAGNOSIS — I10 Essential (primary) hypertension: Secondary | ICD-10-CM | POA: Diagnosis not present

## 2019-07-30 DIAGNOSIS — E782 Mixed hyperlipidemia: Secondary | ICD-10-CM | POA: Diagnosis not present

## 2019-08-27 NOTE — Progress Notes (Signed)
Subjective:   Derrick Stone is a 69 y.o. male who presents for an Initial Medicare Annual Wellness Visit.    This visit is being conducted through telemedicine due to the COVID-19 pandemic. This patient has given me verbal consent via doximity to conduct this visit, patient states they are participating from their home address. Some vital signs may be absent or patient reported.    Patient identification: identified by name, DOB, and current address  Review of Systems  N/A  Cardiac Risk Factors include: advanced age (>31men, >79 women);male gender;hypertension;dyslipidemia    Objective:    Today's Vitals   08/31/19 0817  PainSc: 0-No pain   There is no height or weight on file to calculate BMI. Unable to obtain vitals due to visit being conducted via telephonically.   Advanced Directives 08/31/2019  Does Patient Have a Medical Advance Directive? Yes  Type of Paramedic of [place];Living will  Copy of [place] in Chart? No - copy requested    Current Medications (verified) Outpatient Encounter Medications as of 08/31/2019  Medication Sig  . aspirin EC 81 MG tablet Take by mouth.  Marland Kitchen atorvastatin (LIPITOR) 40 MG tablet TAKE 1 TABLET (40 MG TOTAL) BY MOUTH ONCE DAILY.  Marland Kitchen escitalopram (LEXAPRO) 10 MG tablet TAKE 1 TABLET (10 MG TOTAL) BY MOUTH ONCE DAILY.  . metoprolol succinate (TOPROL XL) 50 MG 24 hr tablet Take 1 tablet by mouth daily.  . mometasone (NASONEX) 50 MCG/ACT nasal spray Place 2 sprays into the nose daily. As needed  . Omega-3 Fatty Acids (FISH OIL PO) Take 1 capsule by mouth daily.  Marland Kitchen omeprazole (PRILOSEC OTC) 20 MG tablet Take 1 tablet by mouth daily.  . clopidogrel (PLAVIX) 75 MG tablet Take 1 tablet by mouth daily.  Marland Kitchen HYDROcodone-homatropine (HYCODAN) 5-1.5 MG/5ML syrup Take 5 mLs by mouth every 8 (eight) hours as needed for cough. (Patient not taking: Reported on 08/31/2019)  . omeprazole (PRILOSEC) 20 MG capsule  TAKE 1 CAPSULE (20 MG TOTAL) BY MOUTH ONCE DAILY.   No facility-administered encounter medications on file as of 08/31/2019.    Allergies (verified) Nitroglycerin   History: Past Medical History:  Diagnosis Date  . CAD (coronary artery disease)   . History of chickenpox   . History of mumps   . Hyperlipidemia   . Hypertension    Past Surgical History:  Procedure Laterality Date  . Heart artery stent  2008   x2  . HERNIA REPAIR  2016   Family History  Problem Relation Age of Onset  . Melanoma Mother   . Diabetes Father   . Bone cancer Father   . Testicular cancer Brother   . Heart disease Paternal Grandfather    Social History   Socioeconomic History  . Marital status: Married    Spouse name: Not on file  . Number of children: 2  . Years of education: Not on file  . 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 Smoking status: Never Smoker  . Smokeless tobacco: Never Used  Substance and Sexual Activity  . Alcohol use: No  . Drug use: No  . Sexual activity: Not on file  Other Topics Concern  . Not on file  Social History Narrative  . Not on file   Social Determinants of Health   Financial Resource Strain: Low Risk   .  Difficulty of Paying Living Expenses: Not hard at all  Food Insecurity: No Food Insecurity  . Worried About Charity fundraiser in the Last Year: Never true  . Ran Out of Food in the Last Year: Never true  Transportation Needs: No Transportation Needs  . Lack of Transportation (Medical): No  . Lack of Transportation (Non-Medical): No  Physical Activity: Inactive  . Days of Exercise per Week: 0 days  . Minutes of Exercise per Session: 0 min  Stress: No Stress Concern Present  . Feeling of Stress : Not at all  Social Connections: Slightly Isolated  . Frequency of Communication with Friends and Family: More than three times a  week  . Frequency of Social Gatherings with Friends and Family: More than three times a week  . Attends Religious Services: Never  . Active Member of Clubs or Organizations: Yes  . Attends Archivist Meetings: More than 4 times per year  . Marital Status: Married   Tobacco Counseling Counseling given: Not Answered   Clinical Intake:  Pre-visit preparation completed: Yes  Pain : No/denies pain Pain Score: 0-No pain     Nutritional Risks: None Diabetes: No(Pre-diabetic)  How often do you need to have someone help you when you read instructions, pamphlets, or other written materials from your doctor or pharmacy?: 1 - Never  Interpreter Needed?: No  Information entered by :: [facility], LPN  Activities of Daily Living In your present state of health, do you have any difficulty performing the following activities: 08/31/2019  Hearing? N  Vision? N  Difficulty concentrating or making decisions? N  Walking or climbing stairs? N  Dressing or bathing? N  Doing errands, shopping? N  Preparing Food and eating ? N  Using the Toilet? N  In the past six months, have you accidently leaked urine? N  Do you have problems with loss of bowel control? N  Managing your Medications? N  Managing your Finances? N  Housekeeping or managing your Housekeeping? N  Some recent data might be hidden     Immunizations and Health Maintenance Immunization History  Administered Date(s) Administered  . Influenza Split 04/04/2009  . Influenza, High Dose Seasonal PF 08/08/2018  . Influenza,inj,Quad PF,6+ Mos 07/23/2013, 04/12/2017  . Pneumococcal Polysaccharide-23 08/29/2018  . Tdap 04/04/2009  . Zoster 07/23/2013  . Zoster Recombinat (Shingrix) 04/12/2017, 06/12/2017   Health Maintenance Due  Topic Date Due  . INFLUENZA VACCINE  02/14/2019  . PNA vac Low Risk Adult (2 of 2 - PCV13) 08/30/2019    Patient Care Team: Birdie Sons, MD as PCP - General (Family Medicine) Ubaldo Glassing  Javier Docker, MD as Consulting Physician (Cardiology) Ralene Bathe, MD (Dermatology) Lorelee Cover., MD (Ophthalmology) Clyde Canterbury, MD as Referring Physician (Otolaryngology)  Indicate any recent Medical Services you may have received from other than [facility] providers in the past year (date may be approximate).    Assessment:   This is a routine wellness examination for [place].  Hearing/Vision screen No exam data present  Dietary issues and exercise activities discussed: Current Exercise Habits: The patient does not participate in regular exercise at present(Plays golf weekly), Exercise limited by: None identified  Goals    . DIET - INCREASE WATER INTAKE     Recommend to drink at least 6-8 8oz glasses of water per day.      Depression Screen PHQ 2/9 Scores 08/31/2019 08/31/2019 08/29/2018 04/12/2017  PHQ - 2 Score 0 0 0 0  PHQ- 9 Score - -  0 0    Fall Risk Fall Risk  08/31/2019  Falls in the past year? 0  Number falls in past yr: 0  Injury with Fall? 0    FALL RISK PREVENTION PERTAINING TO THE HOME:  Any stairs in or around the home? Yes  If so, are there any without handrails? No   Home free of loose throw rugs in walkways, pet beds, electrical cords, etc? Yes  Adequate lighting in your home to reduce risk of falls? Yes   ASSISTIVE DEVICES UTILIZED TO PREVENT FALLS:  Life alert? No  Use of a cane, walker or w/c? No  Grab bars in the bathroom? No  Shower chair or bench in shower? No  Elevated toilet seat or a handicapped toilet? No    TIMED UP AND GO:  Was the test performed? No .    Cognitive Function: Declined today.      6CIT Screen 08/29/2018  What Year? 0 points  What month? 0 points  What time? 0 points  Count back from 20 0 points  Months in reverse 0 points  Repeat phrase 0 points  Total Score 0    Screening Tests Health Maintenance  Topic Date Due  . INFLUENZA VACCINE  02/14/2019  . PNA vac Low Risk Adult (2 of 2 - PCV13) 08/30/2019  .  TETANUS/TDAP  08/30/2020 (Originally 04/05/2019)  . COLONOSCOPY  10/28/2023  . Hepatitis C Screening  Completed    Qualifies for Shingles Vaccine? Completed series  Tdap: Although this vaccine is not a covered service during a Wellness Exam, does the patient still wish to receive this vaccine today?  No . Advised may receive this vaccine at local pharmacy or Health Dept. Aware to provide a copy of the vaccination record if obtained from local pharmacy or Health Dept. Verbalized acceptance and understanding.  Flu Vaccine: Due for Flu vaccine. Does the patient want to receive this vaccine today?  Yes .   Pneumococcal Vaccine: Due for Pneumococcal vaccine. Does the patient want to receive this vaccine today?  Yes.  Cancer Screenings:  Colorectal Screening: Completed 04/15/17. Repeat every 10 years.   Lung Cancer Screening: (Low Dose CT Chest recommended if Age 62-80 years, 30 pack-year currently smoking OR have quit w/in 15years.) does not qualify.   Additional Screening:  Hepatitis C Screening: Up to date  Vision Screening: Recommended annual ophthalmology exams for early detection of glaucoma and other disorders of the eye.  Dental Screening: Recommended annual dental exams for proper oral hygiene  Community Resource Referral:  CRR required this visit?  No        Plan:  I have personally reviewed and addressed the Medicare Annual Wellness questionnaire and have noted the following in the patient's chart:  A. Medical and social history B. Use of alcohol, tobacco or illicit drugs  C. Current medications and supplements D. Functional ability and status E.  Nutritional status F.  Physical activity G. Advance directives H. List of other physicians I.  Hospitalizations, surgeries, and ER visits in previous 12 months J.  [place] such as hearing and vision if needed, cognitive and depression L. Referrals and appointments   In addition, I have reviewed and discussed  with patient certain preventive protocols, quality metrics, and best practice recommendations. A written personalized care plan for preventive services as well as general preventive health recommendations were provided to patient.   Glendora Score, LPN   [id number]  Nurse Health Advisor  Nurse Notes: Pt would like to receive a flu and Prevnar 13 vaccine at today's in office apt.

## 2019-08-31 ENCOUNTER — Encounter: Payer: Self-pay | Admitting: Family Medicine

## 2019-08-31 ENCOUNTER — Ambulatory Visit (INDEPENDENT_AMBULATORY_CARE_PROVIDER_SITE_OTHER): Payer: Medicare Other | Admitting: Family Medicine

## 2019-08-31 ENCOUNTER — Ambulatory Visit (INDEPENDENT_AMBULATORY_CARE_PROVIDER_SITE_OTHER): Payer: Medicare Other

## 2019-08-31 ENCOUNTER — Other Ambulatory Visit: Payer: Self-pay

## 2019-08-31 VITALS — BP 120/60 | HR 75 | Temp 97.3°F | Resp 16 | Wt 239.0 lb

## 2019-08-31 DIAGNOSIS — I10 Essential (primary) hypertension: Secondary | ICD-10-CM | POA: Diagnosis not present

## 2019-08-31 DIAGNOSIS — E7800 Pure hypercholesterolemia, unspecified: Secondary | ICD-10-CM | POA: Diagnosis not present

## 2019-08-31 DIAGNOSIS — Z0000 Encounter for general adult medical examination without abnormal findings: Secondary | ICD-10-CM

## 2019-08-31 DIAGNOSIS — Z23 Encounter for immunization: Secondary | ICD-10-CM

## 2019-08-31 DIAGNOSIS — I2510 Atherosclerotic heart disease of native coronary artery without angina pectoris: Secondary | ICD-10-CM | POA: Diagnosis not present

## 2019-08-31 DIAGNOSIS — Z125 Encounter for screening for malignant neoplasm of prostate: Secondary | ICD-10-CM | POA: Diagnosis not present

## 2019-08-31 DIAGNOSIS — R7303 Prediabetes: Secondary | ICD-10-CM

## 2019-08-31 NOTE — Patient Instructions (Signed)
Mr. Derrick Stone , Thank you for taking time to come for your Medicare Wellness Visit. I appreciate your ongoing commitment to your health goals. Please review the following plan we discussed and let me know if I can assist you in the future.   Screening recommendations/referrals: Colonoscopy: Up to date, due 04/2027 Recommended yearly ophthalmology/optometry visit for glaucoma screening and checkup Recommended yearly dental visit for hygiene and checkup  Vaccinations: Influenza vaccine: Currently due, would like to receive at in office visit with PCP this afternoon. Pneumococcal vaccine: Prevnar 13 currently due, would like to receive at in office visit with PCP this afternoon. Tdap vaccine: Pt declines today.  Shingles vaccine: Completed series    Advanced directives: Please bring a copy of your POA (Power of Attorney) and/or Living Will to your next appointment.   Conditions/risks identified: Recommend to drink at least 6-8 8oz glasses of water per day.  Next appointment: 2:00 PM today with Dr [name]   Preventive Care 35 Years and Older, Male Preventive care refers to lifestyle choices and visits with your health care provider that can promote health and wellness. What does preventive care include?  A yearly physical exam. This is also called an annual well check.  Dental exams once or twice a year.  Routine eye exams. Ask your health care provider how often you should have your eyes checked.  Personal lifestyle choices, including:  Daily care of your teeth and gums.  Regular physical activity.  Eating a healthy diet.  Avoiding tobacco and drug use.  Limiting alcohol use.  Practicing safe sex.  Taking low doses of aspirin every day.  Taking vitamin and mineral supplements as recommended by your health care provider. What happens during an annual well check? The services and screenings done by your health care provider during your annual well check will depend on your age,  overall health, lifestyle risk factors, and family history of disease. Counseling  Your health care provider may ask you questions about your:  Alcohol use.  Tobacco use.  Drug use.  Emotional well-being.  Home and relationship well-being.  Sexual activity.  Eating habits.  History of falls.  Memory and ability to understand (cognition).  Work and work Statistician. Screening  You may have the following tests or measurements:  Height, weight, and BMI.  Blood pressure.  Lipid and cholesterol levels. These may be checked every 5 years, or more frequently if you are over 53 years old.  Skin check.  Lung cancer screening. You may have this screening every year starting at age 71 if you have a 30-pack-year history of smoking and currently smoke or have quit within the past 15 years.  Fecal occult blood test (FOBT) of the stool. You may have this test every year starting at age 98.  Flexible sigmoidoscopy or colonoscopy. You may have a sigmoidoscopy every 5 years or a colonoscopy every 10 years starting at age 24.  Prostate cancer screening. Recommendations will vary depending on your family history and other risks.  Hepatitis C blood test.  Hepatitis B blood test.  Sexually transmitted disease (STD) testing.  Diabetes screening. This is done by checking your blood sugar (glucose) after you have not eaten for a while (fasting). You may have this done every 1-3 years.  Abdominal aortic aneurysm (AAA) screening. You may need this if you are a current or former smoker.  Osteoporosis. You may be screened starting at age 56 if you are at high risk. Talk with your health care provider  about your test results, treatment options, and if necessary, the need for more tests. Vaccines  Your health care provider may recommend certain vaccines, such as:  Influenza vaccine. This is recommended every year.  Tetanus, diphtheria, and acellular pertussis (Tdap, Td) vaccine. You may  need a Td booster every 10 years.  Zoster vaccine. You may need this after age 1.  Pneumococcal 13-valent conjugate (PCV13) vaccine. One dose is recommended after age 14.  Pneumococcal polysaccharide (PPSV23) vaccine. One dose is recommended after age 13. Talk to your health care provider about which screenings and vaccines you need and how often you need them. This information is not intended to replace advice given to you by your health care provider. Make sure you discuss any questions you have with your health care provider. Document Released: 07/29/2015 Document Revised: 03/21/2016 Document Reviewed: 05/03/2015 Elsevier Interactive Patient Education  2017 [place] Prevention in the Home Falls can cause injuries. They can happen to people of all ages. There are many things you can do to make your home safe and to help prevent falls. What can I do on the outside of my home?  Regularly fix the edges of walkways and driveways and fix any cracks.  Remove anything that might make you trip as you walk through a door, such as a raised step or threshold.  Trim any bushes or trees on the path to your home.  Use bright outdoor lighting.  Clear any walking paths of anything that might make someone trip, such as rocks or tools.  Regularly check to see if handrails are loose or broken. Make sure that both sides of any steps have handrails.  Any raised decks and porches should have guardrails on the edges.  Have any leaves, snow, or ice cleared regularly.  Use sand or salt on walking paths during winter.  Clean up any spills in your garage right away. This includes oil or grease spills. What can I do in the bathroom?  Use night lights.  Install grab bars by the toilet and in the tub and shower. Do not use towel bars as grab bars.  Use non-skid mats or decals in the tub or shower.  If you need to sit down in the shower, use a plastic, non-slip stool.  Keep the floor  dry. Clean up any water that spills on the floor as soon as it happens.  Remove soap buildup in the tub or shower regularly.  Attach bath mats securely with double-sided non-slip rug tape.  Do not have throw rugs and other things on the floor that can make you trip. What can I do in the bedroom?  Use night lights.  Make sure that you have a light by your bed that is easy to reach.  Do not use any sheets or blankets that are too big for your bed. They should not hang down onto the floor.  Have a firm chair that has side arms. You can use this for support while you get dressed.  Do not have throw rugs and other things on the floor that can make you trip. What can I do in the kitchen?  Clean up any spills right away.  Avoid walking on wet floors.  Keep items that you use a lot in easy-to-reach places.  If you need to reach something above you, use a strong step stool that has a grab bar.  Keep electrical cords out of the way.  Do not use floor polish or  wax that makes floors slippery. If you must use wax, use non-skid floor wax.  Do not have throw rugs and other things on the floor that can make you trip. What can I do with my stairs?  Do not leave any items on the stairs.  Make sure that there are handrails on both sides of the stairs and use them. Fix handrails that are broken or loose. Make sure that handrails are as long as the stairways.  Check any carpeting to make sure that it is firmly attached to the stairs. Fix any carpet that is loose or worn.  Avoid having throw rugs at the top or bottom of the stairs. If you do have throw rugs, attach them to the floor with carpet tape.  Make sure that you have a light switch at the top of the stairs and the bottom of the stairs. If you do not have them, ask someone to add them for you. What else can I do to help prevent falls?  Wear shoes that:  Do not have high heels.  Have rubber bottoms.  Are comfortable and fit you  well.  Are closed at the toe. Do not wear sandals.  If you use a stepladder:  Make sure that it is fully opened. Do not climb a closed stepladder.  Make sure that both sides of the stepladder are locked into place.  Ask someone to hold it for you, if possible.  Clearly mark and make sure that you can see:  Any grab bars or handrails.  First and last steps.  Where the edge of each step is.  Use tools that help you move around (mobility aids) if they are needed. These include:  Canes.  Walkers.  Scooters.  Crutches.  Turn on the lights when you go into a dark area. Replace any light bulbs as soon as they burn out.  Set up your furniture so you have a clear path. Avoid moving your furniture around.  If any of your floors are uneven, fix them.  If there are any pets around you, be aware of where they are.  Review your medicines with your doctor. Some medicines can make you feel dizzy. This can increase your chance of falling. Ask your doctor what other things that you can do to help prevent falls. This information is not intended to replace advice given to you by your health care provider. Make sure you discuss any questions you have with your health care provider. Document Released: 04/28/2009 Document Revised: 12/08/2015 Document Reviewed: 08/06/2014 Elsevier Interactive Patient Education  2017 [employer].

## 2019-08-31 NOTE — Addendum Note (Signed)
Addended by: Fabio Neighbors A on: 08/31/2019 12:15 PM   Modules accepted: Miquel Dunn

## 2019-08-31 NOTE — Patient Instructions (Addendum)
.   Please review the attached list of medications and notify my office if there are any errors.   . Please bring all of your medications to every appointment so we can make sure that our medication list is the same as yours.   . Please go to the lab draw station in [address] on the second floor of [facility]  when you are fasting for 8 hours. Normal hours are 8:00am to 12:30pm and 1:30pm to 4:00pm Monday through Friday   You need to wait 2 weeks after getting the flu vaccine before getting the first Covid shot, so you could get the Covid vaccine after 09/14/2019 if you have the opportunity

## 2019-08-31 NOTE — Progress Notes (Signed)
Patient: Derrick Stone Male    DOB: [birth year]   69 y.o.   MRN: [id number] Visit Date: 08/31/2019  Today's Provider: Lelon Huh, MD   Chief Complaint  Patient presents with  . Hypertension  . Gastroesophageal Reflux  . Hyperlipidemia  . Hyperglycemia   Subjective:     HPI  Hypertension, follow-up:  BP Readings from Last 3 Encounters:  08/31/19 120/60  08/29/18 116/78  06/19/17 126/70    He was last seen for hypertension 1 year ago.  BP at that visit was 116/78. Management since that visit includes none. He reports good compliance with treatment. He is not having side effects.  He is not exercising. He is adherent to low salt diet.   Outside blood pressures are not checked. He is experiencing none.  Patient denies chest pain, chest pressure/discomfort, claudication, dyspnea, exertional chest pressure/discomfort, fatigue, irregular heart beat, lower extremity edema, near-syncope, orthopnea, palpitations, paroxysmal nocturnal dyspnea, syncope and tachypnea.   Cardiovascular risk factors include advanced age (older than 26 for men, 21 for women), dyslipidemia, hypertension and male gender.  Use of agents associated with hypertension: NSAIDS.     Weight trend: fluctuating a bit Wt Readings from Last 3 Encounters:  08/31/19 239 lb (108.4 kg)  08/29/18 233 lb (105.7 kg)  06/19/17 224 lb 12.8 oz (102 kg)    Current diet: well balanced  ------------------------------------------------------------------------  Prediabetes, Follow-up:   Lab Results  Component Value Date   HGBA1C 5.9 (H) 08/29/2018   HGBA1C 6.0 (H) 04/12/2017   GLUCOSE 81 08/29/2018   GLUCOSE 96 07/15/2009   GLUCOSE 121 (H) 07/14/2009    Last seen for for this1 year ago.  Management since that visit includes no changes. Current symptoms include none and have been stable.  Weight trend: fluctuating a bit Prior visit with dietician: no Current diet: well balanced Current exercise:  none  Pertinent Labs:    Component Value Date/Time   CHOL 144 08/29/2018 1046   TRIG 178 (H) 08/29/2018 1046   CHOLHDL 3.8 08/29/2018 1046   CREATININE 1.08 08/29/2018 1046    Wt Readings from Last 3 Encounters:  08/31/19 239 lb (108.4 kg)  08/29/18 233 lb (105.7 kg)  06/19/17 224 lb 12.8 oz (102 kg)    Follow up for GERD:  The patient was last seen for this 1 year ago. Changes made at last visit include none.  He reports good compliance with treatment. He feels that condition is Unchanged. He is not having side effects.   ------------------------------------------------------------------------------------  Lipid/Cholesterol, Follow-up:   Last seen for this1 year ago.  Management changes since that visit include none. . Last Lipid Panel:    Component Value Date/Time   CHOL 144 08/29/2018 1046   TRIG 178 (H) 08/29/2018 1046   HDL 38 (L) 08/29/2018 1046   CHOLHDL 3.8 08/29/2018 1046   [place] 70 08/29/2018 1046    Risk factors for vascular disease include hypercholesterolemia and hypertension  He reports good compliance with treatment. He is not having side effects.  Current symptoms include none and have been stable. Weight trend: fluctuating a bit Prior visit with dietician: no Current diet: well balanced Current exercise: none  Wt Readings from Last 3 Encounters:  08/31/19 239 lb (108.4 kg)  08/29/18 233 lb (105.7 kg)  06/19/17 224 lb 12.8 oz (102 kg)    -------------------------------------------------------------------  Allergies  Allergen Reactions  . Gold-Containing Drug Products   . Nitroglycerin Other (See Comments)    Passed  out when given to him once     Current Outpatient Medications:  .  aspirin EC 81 MG tablet, Take by mouth., Disp: , Rfl:  .  atorvastatin (LIPITOR) 40 MG tablet, TAKE 1 TABLET (40 MG TOTAL) BY MOUTH ONCE DAILY., Disp: , Rfl: 5 .  escitalopram (LEXAPRO) 10 MG tablet, TAKE 1 TABLET (10 MG TOTAL) BY MOUTH ONCE DAILY.,  Disp: , Rfl: 1 .  metoprolol succinate (TOPROL XL) 50 MG 24 hr tablet, Take 1 tablet by mouth daily., Disp: , Rfl:  .  mometasone (NASONEX) 50 MCG/ACT nasal spray, Place 2 sprays into the nose daily. As needed, Disp: , Rfl:  .  Omega-3 Fatty Acids (FISH OIL PO), Take 1 capsule by mouth daily., Disp: , Rfl:  .  omeprazole (PRILOSEC OTC) 20 MG tablet, Take 1 tablet by mouth daily., Disp: , Rfl:   Review of Systems  Constitutional: Negative for appetite change, chills and fever.  Respiratory: Negative for chest tightness, shortness of breath and wheezing.   Cardiovascular: Negative for chest pain and palpitations.  Gastrointestinal: Negative for abdominal pain, nausea and vomiting.    Social History   Tobacco Use  . Smoking status: Never Smoker  . Smokeless tobacco: Never Used  Substance Use Topics  . Alcohol use: No      Objective:   BP 120/60 (BP Location: Left Arm, Patient Position: Sitting, Cuff Size: Large)   Pulse 75   Temp (!) 97.3 F (36.3 C) (Temporal)   Resp 16   Wt 239 lb (108.4 kg)   SpO2 99% Comment: room air  BMI 31.53 kg/m  Vitals:   08/31/19 1351  BP: 120/60  Pulse: 75  Resp: 16  Temp: (!) 97.3 F (36.3 C)  TempSrc: Temporal  SpO2: 99%  Weight: 239 lb (108.4 kg)  Body mass index is 31.53 kg/m.   Physical Exam   General Appearance:    Overweight male. Alert, cooperative, in no acute distress, appears stated age  Head:    Normocephalic, without obvious abnormality, atraumatic  Eyes:    PERRL, conjunctiva/corneas clear, EOM's intact, fundi    benign, both eyes       Ears:    Normal TM's and external ear canals, both ears  Nose:   Nares normal, septum midline, mucosa normal, no drainage   or sinus tenderness  Throat:   Lips, mucosa, and tongue normal; teeth and gums normal  Neck:   Supple, symmetrical, trachea midline, no adenopathy;       thyroid:  No enlargement/tenderness/nodules; no carotid   bruit or JVD  Back:     Symmetric, no curvature,  ROM normal, no CVA tenderness  Lungs:     Clear to auscultation bilaterally, respirations unlabored  Chest wall:    No tenderness or deformity  Heart:    Normal heart rate. Normal rhythm. No murmurs, rubs, or gallops.  S1 and S2 normal  Abdomen:     Soft, non-tender, bowel sounds active all four quadrants,    no masses, no organomegaly  Genitalia:    deferred  Rectal:    deferred  Extremities:   All extremities are intact. No cyanosis or edema  Pulses:   2+ and symmetric all extremities  Skin:   Skin color, texture, turgor normal, no rashes or lesions  Lymph nodes:   Cervical, supraclavicular, and axillary nodes normal  Neurologic:   CNII-XII intact. Normal strength, sensation and reflexes      throughout  Assessment & Plan    1. Annual physical exam Generally doing well. Has gained weight with bmi over 30. Encouraged to increase exercise and follow prudent diet.   2. Need for influenza vaccination  - Flu Vaccine QUAD High Dose(Fluad)  3. Atherosclerosis of native coronary artery of native heart without angina pectoris Asymptomatic. Compliant with medication.  Continue aggressive risk factor modification.  Continue regular follow up dr. Ubaldo Glassing Patient reports Dr. Ubaldo Glassing had him stop clopidogrel.   4. Benign essential hypertension Well controlled.  Continue current medications.    5. Pure hypercholesterolemia He is tolerating atorvastatin well with no adverse effects.   - Comprehensive metabolic panel - Lipid panel  6. Pre-diabetes  - Hemoglobin A1c  7. Prostate cancer screening  The entirety of the information documented in the History of Present Illness, Review of Systems and Physical Exam were personally obtained by me. Portions of this information were initially documented by Meyer Cory, CMA and reviewed by me for thoroughness and accuracy.   - PSA     Lelon Huh, MD  [facility]

## 2019-09-01 DIAGNOSIS — E7800 Pure hypercholesterolemia, unspecified: Secondary | ICD-10-CM | POA: Diagnosis not present

## 2019-09-01 DIAGNOSIS — R7303 Prediabetes: Secondary | ICD-10-CM | POA: Diagnosis not present

## 2019-09-01 DIAGNOSIS — Z125 Encounter for screening for malignant neoplasm of prostate: Secondary | ICD-10-CM | POA: Diagnosis not present

## 2019-09-02 LAB — HEMOGLOBIN A1C
Est. average glucose Bld gHb Est-mCnc: 128 mg/dL
Hgb A1c MFr Bld: 6.1 % — ABNORMAL HIGH (ref 4.8–5.6)

## 2019-09-02 LAB — COMPREHENSIVE METABOLIC PANEL
ALT: 23 IU/L (ref 0–44)
AST: 24 IU/L (ref 0–40)
Albumin/Globulin Ratio: 1.7 (ref 1.2–2.2)
Albumin: 4.8 g/dL (ref 3.8–4.8)
Alkaline Phosphatase: 47 IU/L (ref 39–117)
BUN/Creatinine Ratio: 13 (ref 10–24)
BUN: 15 mg/dL (ref 8–27)
Bilirubin Total: 0.6 mg/dL (ref 0.0–1.2)
CO2: 23 mmol/L (ref 20–29)
Calcium: 9.5 mg/dL (ref 8.6–10.2)
Chloride: 100 mmol/L (ref 96–106)
Creatinine, Ser: 1.12 mg/dL (ref 0.76–1.27)
GFR calc Af Amer: 79 mL/min/{1.73_m2} (ref >59)
GFR calc non Af Amer: 68 mL/min/{1.73_m2} (ref >59)
Globulin, Total: 2.9 g/dL (ref 1.5–4.5)
Glucose: 99 mg/dL (ref 65–99)
Potassium: 4.5 mmol/L (ref 3.5–5.2)
Sodium: 139 mmol/L (ref 134–144)
Total Protein: 7.7 g/dL (ref 6.0–8.5)

## 2019-09-02 LAB — LIPID PANEL
Chol/HDL Ratio: 3.5 ratio (ref 0.0–5.0)
Cholesterol, Total: 133 mg/dL (ref 100–199)
HDL: 38 mg/dL — ABNORMAL LOW (ref >39)
LDL Chol Calc (NIH): 73 mg/dL (ref 0–99)
Triglycerides: 125 mg/dL (ref 0–149)
VLDL Cholesterol Cal: 22 mg/dL (ref 5–40)

## 2019-09-02 LAB — PSA: Prostate Specific Ag, Serum: 1.2 ng/mL (ref 0.0–4.0)

## 2020-03-25 DIAGNOSIS — Z23 Encounter for immunization: Secondary | ICD-10-CM | POA: Diagnosis not present

## 2020-04-06 ENCOUNTER — Encounter: Payer: Self-pay | Admitting: Family Medicine

## 2020-04-07 ENCOUNTER — Encounter: Payer: Self-pay | Admitting: Hospice and Palliative Medicine

## 2020-04-07 ENCOUNTER — Ambulatory Visit: Payer: Self-pay

## 2020-04-07 ENCOUNTER — Ambulatory Visit (INDEPENDENT_AMBULATORY_CARE_PROVIDER_SITE_OTHER): Payer: Medicare Other | Admitting: Physician Assistant

## 2020-04-07 ENCOUNTER — Other Ambulatory Visit: Payer: Self-pay | Admitting: Hospice and Palliative Medicine

## 2020-04-07 DIAGNOSIS — U071 COVID-19: Secondary | ICD-10-CM

## 2020-04-07 NOTE — Progress Notes (Signed)
MyChart Video Visit    Virtual Visit via Video Note   This visit type was conducted due to national recommendations for restrictions regarding the COVID-19 Pandemic (e.g. social distancing) in an effort to limit this patient's exposure and mitigate transmission in our community. This patient is at least at moderate risk for complications without adequate follow up. This format is felt to be most appropriate for this patient at this time. Physical exam was limited by quality of the video and audio technology used for the visit.   Patient location: Home Provider location: Office   I discussed the limitations of evaluation and management by telemedicine and the availability of in person appointments. The patient expressed understanding and agreed to proceed.  Patient: Derrick Stone   DOB: 1951/06/12   69 y.o. Male  MRN: [id number] Visit Date: 04/07/2020  Today's healthcare provider: Trinna Post, PA-C   Chief Complaint  Patient presents with  . Covid Positive   Subjective    HPI   Patient had covid test yesterday and it came back positive. Patient has symptoms of Headache, congestion, cough and nausea. Symptoms started Tuesday 04/05/2020.  Patient's wife also has covid, would like to know about infusion. 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 Is supposed to go back oct 1st for 2nd dose. Denies fevers, chills, nausea, vomiting. Denies SOB. Reports occasional cough and congestion.    Medications: Outpatient Medications Prior to Visit  Medication Sig  . aspirin EC 81 MG tablet Take by mouth.  Marland Kitchen atorvastatin (LIPITOR) 40 MG tablet TAKE 1 TABLET (40 MG TOTAL) BY MOUTH ONCE DAILY.  Marland Kitchen escitalopram (LEXAPRO) 10 MG tablet TAKE 1 TABLET (10 MG TOTAL) BY MOUTH ONCE DAILY.  . metoprolol succinate (TOPROL XL) 50 MG 24 hr tablet Take 1 tablet by mouth daily.  . mometasone (NASONEX) 50 MCG/ACT nasal spray Place 2 sprays into the nose daily. As needed  . Omega-3  Fatty Acids (FISH OIL PO) Take 1 capsule by mouth daily.  Marland Kitchen omeprazole (PRILOSEC OTC) 20 MG tablet Take 1 tablet by mouth daily.   No facility-administered medications prior to visit.    Review of Systems    Objective    There were no vitals taken for this visit.   Physical Exam Constitutional:      Appearance: Normal appearance.  Pulmonary:     Effort: Pulmonary effort is normal. No respiratory distress.  Neurological:     Mental Status: He is alert.  Psychiatric:        Mood and Affect: Mood normal.        Behavior: Behavior normal.        Assessment & Plan    1. COVID-19  -COVID positive - discussed need to quarantine 10 days from start of symptoms and until fever-free for at least 48 hours - discussed need to quarantine household members - discussed symptomatic management, natural course, and return precautions - Referral placed for infusion treatment. Advised to complete vaccination however he will have to wait for a period of time if he gets infusion.     No follow-ups on file.     I discussed the assessment and treatment plan with the patient. The patient was provided an opportunity to ask questions and all were answered. The patient agreed with the plan and demonstrated an understanding of the instructions.   The patient was advised to call back or seek an in-person evaluation if the symptoms worsen or if the  condition fails to improve as anticipated.   ITrinna Post, PA-C, have reviewed all documentation for this visit. The documentation on 04/07/20 for the exam, diagnosis, procedures, and orders are all accurate and complete.  The entirety of the information documented in the History of Present Illness, Review of Systems and Physical Exam were personally obtained by me. Portions of this information were initially documented by Elonda Husky, CMA and reviewed by me for thoroughness and accuracy.    Paulene Floor [facility] [phone] (phone) [phone] (fax)  [place]

## 2020-04-07 NOTE — Telephone Encounter (Signed)
°  Pt. Reports he and his wife tested positive for COVID 19. He started feeling bad this past Tuesday. Fatigue, head and chest congestion, body aches. Asking if he is a candidate for infusion therapy. Virtual visit for this morning made. Reason for Disposition  [1] Fever > 100.0 F (37.8 C) AND [2] bedridden (e.g., nursing home patient, CVA, chronic illness, recovering from surgery)  Answer Assessment - Initial Assessment Questions 1. COVID-19 DIAGNOSIS: "Who made your Coronavirus (COVID-19) diagnosis?" "Was it confirmed by a positive lab test?" If not diagnosed by a HCP, ask "Are there lots of cases (community spread) where you live?" (See public health department website, if unsure)     Home test 2. COVID-19 EXPOSURE: "Was there any known exposure to COVID before the symptoms began?" CDC Definition of close contact: within 6 feet (2 meters) for a total of 15 minutes or more over a 24-hour period.      Yes 3. ONSET: "When did the COVID-19 symptoms start?"      Tuesday 4. WORST SYMPTOM: "What is your worst symptom?" (e.g., cough, fever, shortness of breath, muscle aches)     Head congestion and chest congestion 5. COUGH: "Do you have a cough?" If Yes, ask: "How bad is the cough?"       Yes 6. FEVER: "Do you have a fever?" If Yes, ask: "What is your temperature, how was it measured, and when did it start?"     Last night -  7. RESPIRATORY STATUS: "Describe your breathing?" (e.g., shortness of breath, wheezing, unable to speak)      No 8. BETTER-SAME-WORSE: "Are you getting better, staying the same or getting worse compared to yesterday?"  If getting worse, ask, "In what way?"     Better 9. HIGH RISK DISEASE: "Do you have any chronic medical problems?" (e.g., asthma, heart or lung disease, weak immune system, obesity, etc.)     Heart attack 15 years ago 39. PREGNANCY: "Is there any chance you are pregnant?" "When was your last menstrual period?"       n/a 11. OTHER SYMPTOMS: "Do you have any  other symptoms?"  (e.g., chills, fatigue, headache, loss of smell or taste, muscle pain, sore throat; new loss of smell or taste especially support the diagnosis of COVID-19)       Fatigue, some dizziness  Protocols used: CORONAVIRUS (COVID-19) DIAGNOSED OR SUSPECTED-A-AH

## 2020-04-07 NOTE — Progress Notes (Signed)
I connected by phone with patient on 04/07/2020 to discuss the potential use of an new treatment for mild to moderate COVID-19 viral infection in non-hospitalized patients.   This patient is a age/sex that meets the FDA criteria for Emergency Use Authorization of casirivimab\imdevimab.  Has a (+) direct SARS-CoV-2 viral test result 1. Has mild or moderate COVID-19  2. Is ? 67 years of age and weighs ? 40 kg 3. Is NOT hospitalized due to COVID-19 4. Is NOT requiring oxygen therapy or requiring an increase in baseline oxygen flow rate due to COVID-19 5. Is within 10 days of symptom onset 6. Has at least one of the high risk factor(s) for progression to severe COVID-19 and/or hospitalization as defined in EUA. ? Specific high risk criteria : age   Symptom onset  9/21   I have spoken and communicated the following to the patient or parent/caregiver:   1. FDA has authorized the emergency use of casirivimab\imdevimab for the treatment of mild to moderate COVID-19 in adults and pediatric patients with positive results of direct SARS-CoV-2 viral testing who are 70 years of age and older weighing at least 40 kg, and who are at high risk for progressing to severe COVID-19 and/or hospitalization.   2. The significant known and potential risks and benefits of casirivimab\imdevimab, and the extent to which such potential risks and benefits are unknown.   3. Information on available alternative treatments and the risks and benefits of those alternatives, including clinical trials.   4. Patients treated with casirivimab\imdevimab should continue to self-isolate and use infection control measures (e.g., wear mask, isolate, social distance, avoid sharing personal items, clean and disinfect "high touch" surfaces, and frequent handwashing) according to CDC guidelines.    5. The patient or parent/caregiver has the option to accept or refuse casirivimab\imdevimab .   After reviewing this information with the  patient, The patient agreed to proceed with receiving casirivimab\imdevimab infusion and will be provided a copy of the Fact sheet prior to receiving the infusion.Altha Harm, PhD, NP-C [phone] ([place])

## 2020-04-08 ENCOUNTER — Ambulatory Visit (HOSPITAL_COMMUNITY)
Admission: RE | Admit: 2020-04-08 | Discharge: 2020-04-08 | Disposition: A | Discharge status: Home / Self Care | Payer: Medicare Other | Source: Ambulatory Visit | Attending: Pulmonary Disease | Admitting: Pulmonary Disease

## 2020-04-08 DIAGNOSIS — Z23 Encounter for immunization: Secondary | ICD-10-CM | POA: Insufficient documentation

## 2020-04-08 DIAGNOSIS — U071 COVID-19: Secondary | ICD-10-CM | POA: Insufficient documentation

## 2020-04-08 MED ORDER — ALBUTEROL SULFATE HFA 108 (90 BASE) MCG/ACT IN AERS: 2.0000 | INHALATION_SPRAY | Freq: Once | RESPIRATORY_TRACT | Status: DC | PRN

## 2020-04-08 MED ORDER — DIPHENHYDRAMINE HCL 50 MG/ML IJ SOLN: 50.0000 mg | Freq: Once | INTRAMUSCULAR | Status: DC | PRN

## 2020-04-08 MED ORDER — FAMOTIDINE IN NACL 20-0.9 MG/50ML-% IV SOLN: 20.0000 mg | Freq: Once | INTRAVENOUS | Status: DC | PRN

## 2020-04-08 MED ORDER — METHYLPREDNISOLONE SODIUM SUCC 125 MG IJ SOLR: 125.0000 mg | Freq: Once | INTRAMUSCULAR | Status: DC | PRN

## 2020-04-08 MED ORDER — SODIUM CHLORIDE 0.9 % IV SOLN
1200.0000 mg | Freq: Once | INTRAVENOUS | Status: AC
  Administered 2020-04-08: 1200 mg via INTRAVENOUS

## 2020-04-08 MED ORDER — EPINEPHRINE 0.3 MG/0.3ML IJ SOAJ: 0.3000 mg | Freq: Once | INTRAMUSCULAR | Status: DC | PRN

## 2020-04-08 MED ORDER — SODIUM CHLORIDE 0.9 % IV SOLN: INTRAVENOUS | Status: DC | PRN

## 2020-04-08 NOTE — Progress Notes (Signed)
  Diagnosis: COVID-19  Physician: Dr Joya Gaskins  Procedure: [facility] Med: casirivimab\imdevimab infusion - Provided patient with casirivimab\imdevimab fact sheet for patients, parents and caregivers prior to infusion.  Complications: No immediate complications noted.  Discharge: Discharged home   Carin Hock [facility] 04/08/2020

## 2020-04-08 NOTE — Discharge Instructions (Signed)

## 2020-04-08 NOTE — Progress Notes (Signed)
  Diagnosis: COVID-19  Physician: Asencion Noble, MD  Procedure: [facility] Infusion Clinic Med: casirivimab\imdevimab infusion - Provided patient with casirivimab\imdevimab fact sheet for patients, parents and caregivers prior to infusion.  Complications: No immediate complications noted.  Discharge: Discharged home   Derrick Stone 04/08/2020

## 2020-04-11 NOTE — Telephone Encounter (Signed)
Patient had virtual visit on Friday with Adriana.

## 2020-07-13 ENCOUNTER — Other Ambulatory Visit: Payer: Self-pay | Admitting: Dermatology

## 2020-07-20 DIAGNOSIS — Z23 Encounter for immunization: Secondary | ICD-10-CM | POA: Diagnosis not present

## 2020-08-02 DIAGNOSIS — I2510 Atherosclerotic heart disease of native coronary artery without angina pectoris: Secondary | ICD-10-CM | POA: Diagnosis not present

## 2020-08-02 DIAGNOSIS — I10 Essential (primary) hypertension: Secondary | ICD-10-CM | POA: Diagnosis not present

## 2020-08-02 DIAGNOSIS — E7800 Pure hypercholesterolemia, unspecified: Secondary | ICD-10-CM | POA: Diagnosis not present

## 2020-08-02 DIAGNOSIS — E782 Mixed hyperlipidemia: Secondary | ICD-10-CM | POA: Diagnosis not present

## 2020-09-19 ENCOUNTER — Other Ambulatory Visit: Payer: Self-pay

## 2020-09-19 ENCOUNTER — Ambulatory Visit (INDEPENDENT_AMBULATORY_CARE_PROVIDER_SITE_OTHER): Payer: Medicare Other | Admitting: Family Medicine

## 2020-09-19 ENCOUNTER — Inpatient Hospital Stay
Admission: EM | Admit: 2020-09-19 | Discharge: 2020-09-21 | DRG: 247 | Disposition: A | Discharge status: Home / Self Care | Payer: Medicare Other | Attending: Internal Medicine | Admitting: Internal Medicine

## 2020-09-19 ENCOUNTER — Encounter: Payer: Self-pay | Admitting: Family Medicine

## 2020-09-19 ENCOUNTER — Emergency Department: Payer: Medicare Other

## 2020-09-19 VITALS — BP 120/77 | HR 75 | Temp 98.1°F | Resp 18 | Wt 233.0 lb

## 2020-09-19 DIAGNOSIS — F419 Anxiety disorder, unspecified: Secondary | ICD-10-CM | POA: Diagnosis present

## 2020-09-19 DIAGNOSIS — I25110 Atherosclerotic heart disease of native coronary artery with unstable angina pectoris: Secondary | ICD-10-CM | POA: Diagnosis not present

## 2020-09-19 DIAGNOSIS — I214 Non-ST elevation (NSTEMI) myocardial infarction: Secondary | ICD-10-CM | POA: Diagnosis present

## 2020-09-19 DIAGNOSIS — Z833 Family history of diabetes mellitus: Secondary | ICD-10-CM

## 2020-09-19 DIAGNOSIS — Z955 Presence of coronary angioplasty implant and graft: Secondary | ICD-10-CM

## 2020-09-19 DIAGNOSIS — Z20822 Contact with and (suspected) exposure to covid-19: Secondary | ICD-10-CM | POA: Diagnosis present

## 2020-09-19 DIAGNOSIS — D72829 Elevated white blood cell count, unspecified: Secondary | ICD-10-CM | POA: Diagnosis not present

## 2020-09-19 DIAGNOSIS — E785 Hyperlipidemia, unspecified: Secondary | ICD-10-CM | POA: Diagnosis present

## 2020-09-19 DIAGNOSIS — I10 Essential (primary) hypertension: Secondary | ICD-10-CM | POA: Diagnosis present

## 2020-09-19 DIAGNOSIS — I208 Other forms of angina pectoris: Secondary | ICD-10-CM | POA: Diagnosis not present

## 2020-09-19 DIAGNOSIS — I252 Old myocardial infarction: Secondary | ICD-10-CM

## 2020-09-19 DIAGNOSIS — K219 Gastro-esophageal reflux disease without esophagitis: Secondary | ICD-10-CM | POA: Diagnosis present

## 2020-09-19 DIAGNOSIS — R7303 Prediabetes: Secondary | ICD-10-CM | POA: Diagnosis present

## 2020-09-19 DIAGNOSIS — I200 Unstable angina: Secondary | ICD-10-CM | POA: Diagnosis not present

## 2020-09-19 DIAGNOSIS — Z9861 Coronary angioplasty status: Secondary | ICD-10-CM | POA: Diagnosis not present

## 2020-09-19 DIAGNOSIS — R1111 Vomiting without nausea: Secondary | ICD-10-CM | POA: Diagnosis not present

## 2020-09-19 DIAGNOSIS — I2510 Atherosclerotic heart disease of native coronary artery without angina pectoris: Secondary | ICD-10-CM | POA: Diagnosis present

## 2020-09-19 DIAGNOSIS — Z79899 Other long term (current) drug therapy: Secondary | ICD-10-CM | POA: Diagnosis not present

## 2020-09-19 DIAGNOSIS — R739 Hyperglycemia, unspecified: Secondary | ICD-10-CM | POA: Diagnosis present

## 2020-09-19 DIAGNOSIS — Z7982 Long term (current) use of aspirin: Secondary | ICD-10-CM

## 2020-09-19 DIAGNOSIS — R0789 Other chest pain: Secondary | ICD-10-CM | POA: Diagnosis not present

## 2020-09-19 DIAGNOSIS — R61 Generalized hyperhidrosis: Secondary | ICD-10-CM | POA: Diagnosis not present

## 2020-09-19 DIAGNOSIS — Z8249 Family history of ischemic heart disease and other diseases of the circulatory system: Secondary | ICD-10-CM

## 2020-09-19 DIAGNOSIS — Z888 Allergy status to other drugs, medicaments and biological substances status: Secondary | ICD-10-CM | POA: Diagnosis not present

## 2020-09-19 DIAGNOSIS — R079 Chest pain, unspecified: Secondary | ICD-10-CM | POA: Diagnosis not present

## 2020-09-19 HISTORY — DX: Non-ST elevation (NSTEMI) myocardial infarction: I21.4

## 2020-09-19 LAB — CBC WITH DIFFERENTIAL/PLATELET
Abs Immature Granulocytes: 0.05 10*3/uL (ref 0.00–0.07)
Basophils Absolute: 0.1 10*3/uL (ref 0.0–0.1)
Basophils Relative: 1 %
Eosinophils Absolute: 0.3 10*3/uL (ref 0.0–0.5)
Eosinophils Relative: 2 %
HCT: 43 % (ref 39.0–52.0)
Hemoglobin: 14.8 g/dL (ref 13.0–17.0)
Immature Granulocytes: 0 %
Lymphocytes Relative: 25 %
Lymphs Abs: 3.2 10*3/uL (ref 0.7–4.0)
MCH: 30.3 pg (ref 26.0–34.0)
MCHC: 34.4 g/dL (ref 30.0–36.0)
MCV: 88.1 fL (ref 80.0–100.0)
Monocytes Absolute: 0.8 10*3/uL (ref 0.1–1.0)
Monocytes Relative: 7 %
Neutro Abs: 8.1 10*3/uL — ABNORMAL HIGH (ref 1.7–7.7)
Neutrophils Relative %: 65 %
Platelets: 148 10*3/uL — ABNORMAL LOW (ref 150–400)
RBC: 4.88 MIL/uL (ref 4.22–5.81)
RDW: 12.1 % (ref 11.5–15.5)
WBC: 12.5 10*3/uL — ABNORMAL HIGH (ref 4.0–10.5)
nRBC: 0 % (ref 0.0–0.2)

## 2020-09-19 LAB — BRAIN NATRIURETIC PEPTIDE: B Natriuretic Peptide: 88.3 pg/mL (ref 0.0–100.0)

## 2020-09-19 LAB — CBG MONITORING, ED: Glucose-Capillary: 126 mg/dL — ABNORMAL HIGH (ref 70–99)

## 2020-09-19 LAB — TROPONIN I (HIGH SENSITIVITY)
Troponin I (High Sensitivity): 545 ng/L (ref <18)
Troponin I (High Sensitivity): 614 ng/L (ref <18)

## 2020-09-19 LAB — COMPREHENSIVE METABOLIC PANEL
ALT: 26 U/L (ref 0–44)
AST: 36 U/L (ref 15–41)
Albumin: 4.5 g/dL (ref 3.5–5.0)
Alkaline Phosphatase: 37 U/L — ABNORMAL LOW (ref 38–126)
Anion gap: 8 (ref 5–15)
BUN: 14 mg/dL (ref 8–23)
CO2: 24 mmol/L (ref 22–32)
Calcium: 9.6 mg/dL (ref 8.9–10.3)
Chloride: 105 mmol/L (ref 98–111)
Creatinine, Ser: 0.97 mg/dL (ref 0.61–1.24)
GFR, Estimated: >60 mL/min (ref >60)
Glucose, Bld: 183 mg/dL — ABNORMAL HIGH (ref 70–99)
Potassium: 3.6 mmol/L (ref 3.5–5.1)
Sodium: 137 mmol/L (ref 135–145)
Total Bilirubin: 0.7 mg/dL (ref 0.3–1.2)
Total Protein: 8 g/dL (ref 6.5–8.1)

## 2020-09-19 LAB — HEPARIN LEVEL (UNFRACTIONATED): Heparin Unfractionated: 0.47 IU/mL (ref 0.30–0.70)

## 2020-09-19 LAB — RESP PANEL BY RT-PCR (FLU A&B, COVID) ARPGX2
Influenza A by PCR: NEGATIVE
Influenza B by PCR: NEGATIVE
SARS Coronavirus 2 by RT PCR: NEGATIVE

## 2020-09-19 LAB — APTT: aPTT: 29 seconds (ref 24–36)

## 2020-09-19 LAB — PROTIME-INR
INR: 1 (ref 0.8–1.2)
Prothrombin Time: 12.5 seconds (ref 11.4–15.2)

## 2020-09-19 MED ORDER — ASPIRIN EC 81 MG PO TBEC
81.0000 mg | DELAYED_RELEASE_TABLET | Freq: Every day | ORAL | Status: DC
  Administered 2020-09-20: 81 mg via ORAL
  Filled 2020-09-19: qty 1

## 2020-09-19 MED ORDER — PANTOPRAZOLE SODIUM 40 MG PO TBEC
40.0000 mg | DELAYED_RELEASE_TABLET | Freq: Every day | ORAL | Status: DC
  Administered 2020-09-21: 40 mg via ORAL
  Filled 2020-09-19: qty 1

## 2020-09-19 MED ORDER — OMEGA-3-ACID ETHYL ESTERS 1 G PO CAPS
2.0000 g | ORAL_CAPSULE | Freq: Two times a day (BID) | ORAL | Status: DC
  Administered 2020-09-20 – 2020-09-21 (×2): 2 g via ORAL
  Filled 2020-09-19 (×4): qty 2

## 2020-09-19 MED ORDER — ASPIRIN 81 MG PO CHEW
324.0000 mg | CHEWABLE_TABLET | ORAL | Status: AC
  Administered 2020-09-19: 324 mg via ORAL
  Filled 2020-09-19: qty 4

## 2020-09-19 MED ORDER — ONDANSETRON HCL 4 MG/2ML IJ SOLN
4.0000 mg | Freq: Four times a day (QID) | INTRAMUSCULAR | Status: DC | PRN
  Administered 2020-09-19 – 2020-09-20 (×2): 4 mg via INTRAVENOUS
  Filled 2020-09-19: qty 2

## 2020-09-19 MED ORDER — OMEPRAZOLE MAGNESIUM 20 MG PO TBEC: 20.0000 mg | DELAYED_RELEASE_TABLET | Freq: Every day | ORAL | Status: DC

## 2020-09-19 MED ORDER — SUCRALFATE 1 G PO TABS
1.0000 g | ORAL_TABLET | Freq: Three times a day (TID) | ORAL | Status: DC
  Administered 2020-09-19 – 2020-09-21 (×4): 1 g via ORAL
  Filled 2020-09-19 (×4): qty 1

## 2020-09-19 MED ORDER — HEPARIN SODIUM (PORCINE) 5000 UNIT/ML IJ SOLN
4000.0000 [IU] | Freq: Once | INTRAMUSCULAR | Status: AC
  Administered 2020-09-19: 4000 [IU] via INTRAVENOUS

## 2020-09-19 MED ORDER — HEPARIN (PORCINE) 25000 UT/250ML-% IV SOLN
1300.0000 [IU]/h | INTRAVENOUS | Status: DC
  Administered 2020-09-19: 1300 [IU]/h via INTRAVENOUS
  Filled 2020-09-19 (×2): qty 250

## 2020-09-19 MED ORDER — ATORVASTATIN CALCIUM 80 MG PO TABS
80.0000 mg | ORAL_TABLET | Freq: Every day | ORAL | Status: DC
Start: 2020-09-20 — End: 2020-09-21
  Administered 2020-09-21: 80 mg via ORAL
  Filled 2020-09-19: qty 1

## 2020-09-19 MED ORDER — ACETAMINOPHEN 325 MG PO TABS
650.0000 mg | ORAL_TABLET | ORAL | Status: DC | PRN
  Filled 2020-09-19: qty 2

## 2020-09-19 MED ORDER — SODIUM CHLORIDE 0.9 % IV SOLN: INTRAVENOUS | Status: AC

## 2020-09-19 MED ORDER — ESCITALOPRAM OXALATE 10 MG PO TABS
10.0000 mg | ORAL_TABLET | Freq: Every day | ORAL | Status: DC
  Administered 2020-09-21: 10 mg via ORAL
  Filled 2020-09-19: qty 1

## 2020-09-19 MED ORDER — METOPROLOL SUCCINATE ER 50 MG PO TB24
50.0000 mg | ORAL_TABLET | Freq: Every day | ORAL | Status: DC
  Administered 2020-09-20 – 2020-09-21 (×2): 50 mg via ORAL
  Filled 2020-09-19 (×2): qty 1

## 2020-09-19 MED ORDER — LORAZEPAM 2 MG/ML IJ SOLN
0.5000 mg | Freq: Once | INTRAMUSCULAR | Status: AC
  Administered 2020-09-19: 0.5 mg via INTRAVENOUS
  Filled 2020-09-19: qty 1

## 2020-09-19 MED ORDER — SUCRALFATE 1 G PO TABS: 1.0000 g | ORAL_TABLET | Freq: Three times a day (TID) | ORAL | 3 refills | Status: DC

## 2020-09-19 MED ORDER — ASPIRIN 300 MG RE SUPP: 300.0000 mg | RECTAL | Status: AC

## 2020-09-19 NOTE — Patient Instructions (Signed)
Take 2 Aspirin 81 mg Daily.

## 2020-09-19 NOTE — ED Notes (Signed)
Lab call abourt pt troponin 545. Cheri Fowler, MD notified.

## 2020-09-19 NOTE — H&P (Addendum)
History and Physical    Derrick Stone CHE:[id number] DOB: 06/17/1951 DOA: 09/19/2020  PCP: Birdie Sons, MD  Patient coming from: home  I have personally briefly reviewed patient's old medical records in [place]  Chief Complaint: epigastric pain x 2-3 days and today had chest pain with exertion  HPI: Derrick Stone is a 70 y.o. male with medical history significant of  CAD s/p MI s/p stent 15 years ago, HTN, HLD who presents to ED BIB EMS due to symptoms of intermittent chest pain x 2-3 days. He notes chest pain is across his chest and described as a tightness that at times radiates to the back of his arms. He notes that the pain is made worse with exertion. He also note flare of his GERD symptom with burning epigastric pain that radiates up his chest. He notes that these symptoms are worse with eating and and at times exertional. He notes no sob but the last episode prior to presentation he noted cold chills and per wife he was noted to diaphoretica and pain.  He states he first noted this type of pain 3-4 weeks ago during regular activity he noted a tightness in his chest but it was short lived and infrequent and would be relieved by rest. He again noted these symptoms while playing golf 2-3 days ago, and again noted that it was relieved with rest. He also notes over the last few days he has had increase acid reflux symptoms with episodes of nausea and vomitting. He states however he currently has no chest pain/ n/v/d/abdominal pain currenlty, sob, presyncope or palpitations. He denies cough,sob, uri symptoms.  ED Course:  Afeb, bp 120/77, hr75, rr18, sat 100% Labs: Bnp:88.3 Cxr:NAD Respiratory panel :negative Labs:wbc12.5, hgb14.8, plt148 Na 137, K3.6, glu183, NT614,431 VQM:GQQPY, no st-twave changes Ed course complicated by episode of panic attack which responded well to ativan Of note patient is allergic to nitrates  PP:JKDTOIZ, ativan,morphine Review of Systems: As per  HPI otherwise 10 point review of systems negative.   Past Medical History:  Diagnosis Date  . CAD (coronary artery disease)   . History of chickenpox   . History of mumps   . Hyperlipidemia   . Hypertension     Past Surgical History:  Procedure Laterality Date  . Heart artery stent  2008   x2  . HERNIA REPAIR  2016     reports that he has never smoked. He has never used smokeless tobacco. He reports that he does not drink alcohol and does not use drugs.  Allergies  Allergen Reactions  . Gold-Containing Drug Products   . Nitroglycerin Other (See Comments)    Passed out when given to him once    Family History  Problem Relation Age of Onset  . Melanoma Mother   . Diabetes Father   . Bone cancer Father   . Testicular cancer Brother   . Heart disease Paternal Grandfather     Prior to Admission medications   Medication Sig Start Date End Date Taking? Authorizing Provider  aspirin EC 81 MG tablet Take by mouth. 11/27/06  Yes [provider]  atorvastatin (LIPITOR) 40 MG tablet TAKE 1 TABLET (40 MG TOTAL) BY MOUTH ONCE DAILY. 05/27/17  Yes [provider]  escitalopram (LEXAPRO) 10 MG tablet TAKE 1 TABLET (10 MG TOTAL) BY MOUTH ONCE DAILY. 05/27/17  Yes [provider]  metoprolol succinate (TOPROL-XL) 50 MG 24 hr tablet Take 1 tablet by mouth daily. 11/27/06  Yes [provider]  mometasone (NASONEX) 50 MCG/ACT nasal spray Place 2 sprays into the nose daily. As needed 11/27/06  Yes [provider]  Omega-3 Fatty Acids (FISH OIL PO) Take 1 capsule by mouth daily.   Yes [provider]  omeprazole (PRILOSEC OTC) 20 MG tablet Take 1 tablet by mouth daily. 11/27/06  Yes [provider]  sucralfate (CARAFATE) 1 g tablet Take 1 tablet (1 g total) by mouth 4 (four) times daily -  before meals and at bedtime. 09/19/20   Jerrol Banana., MD    Physical Exam: Vitals:   09/19/20 1830 09/19/20 1930 09/19/20 2030 09/19/20  2100  BP: 129/83 116/76 118/70 114/70  Pulse: 76 80 75 72  Resp: 13 11 13 14   Temp:      TempSrc:      SpO2: 100% 98% 100% 97%  Weight:      Height:         Vitals:   09/19/20 1830 09/19/20 1930 09/19/20 2030 09/19/20 2100  BP: 129/83 116/76 118/70 114/70  Pulse: 76 80 75 72  Resp: 13 11 13 14   Temp:      TempSrc:      SpO2: 100% 98% 100% 97%  Weight:      Height:      Constitutional: NAD, calm, comfortable Eyes: PERRL, lids and conjunctivae normal ENMT: Mucous membranes are moist. Posterior pharynx clear of any exudate or lesions.Normal dentition.  Neck: normal, supple, no masses, no thyromegaly Respiratory: clear to auscultation bilaterally, no wheezing, no crackles. Normal respiratory effort. No accessory muscle use.  Cardiovascular: Regular rate and rhythm, no murmurs / rubs / gallops. No extremity edema. 2+ pedal pulses.  Abdomen: no tenderness, no masses palpated. No hepatosplenomegaly. Bowel sounds positive.  Musculoskeletal: no clubbing / cyanosis. No joint deformity upper and lower extremities. Good ROM, no contractures. Normal muscle tone.  Skin: no rashes, lesions, ulcers. No induration Neurologic: CN 2-12 grossly intact. Sensation intactl. Strength 5/5 in all 4.  Psychiatric: Normal judgment and insight. Alert and oriented x 3. Normal mood.    Labs on Admission: I have personally reviewed following labs and imaging studies  CBC: Recent Labs  Lab 09/19/20 1639  WBC 12.5*  NEUTROABS 8.1*  HGB 14.8  HCT 43.0  MCV 88.1  PLT 419*   Basic Metabolic Panel: Recent Labs  Lab 09/19/20 1639  NA 137  K 3.6  CL 105  CO2 24  GLUCOSE 183*  BUN 14  CREATININE 0.97  CALCIUM 9.6   GFR: Estimated Creatinine Clearance: 94.3 mL/min (by C-G formula based on SCr of 0.97 mg/dL). Liver Function Tests: Recent Labs  Lab 09/19/20 1639  AST 36  ALT 26  ALKPHOS 37*  BILITOT 0.7  PROT 8.0  ALBUMIN 4.5   No results for input(s): LIPASE, AMYLASE in the last 168  hours. No results for input(s): AMMONIA in the last 168 hours. Coagulation Profile: Recent Labs  Lab 09/19/20 1815  INR 1.0   Cardiac Enzymes: No results for input(s): CKTOTAL, CKMB, CKMBINDEX, TROPONINI in the last 168 hours. BNP (last 3 results) No results for input(s): PROBNP in the last 8760 hours. HbA1C: No results for input(s): HGBA1C in the last 72 hours. CBG: No results for input(s): GLUCAP in the last 168 hours. Lipid Profile: No results for input(s): CHOL, HDL, LDLCALC, TRIG, CHOLHDL, LDLDIRECT in the last 72 hours. Thyroid Function Tests: No results for input(s): TSH, T4TOTAL, FREET4, T3FREE, THYROIDAB in the last 72 hours. Anemia  Panel: No results for input(s): VITAMINB12, FOLATE, FERRITIN, TIBC, IRON, RETICCTPCT in the last 72 hours. Urine analysis: No results found for: COLORURINE, APPEARANCEUR, [place], [place], GLUCOSEU, HGBUR, BILIRUBINUR, KETONESUR, PROTEINUR, UROBILINOGEN, NITRITE, LEUKOCYTESUR  Radiological Exams on Admission: DG Chest Port 1 View  Result Date: 09/19/2020 CLINICAL DATA:  Chest pain EXAM: PORTABLE CHEST 1 VIEW COMPARISON:  11/11/2006 FINDINGS: Lungs are clear.  No pleural effusion or pneumothorax. Heart is normal in size. IMPRESSION: No evidence of acute cardiopulmonary disease. Electronically Signed   By: Julian Hy M.D.   On: 09/19/2020 16:35    EKG: Independently reviewed. See above   Assessment/Plan  NSTEMI in background of CAD with hx of remote stent -admit to progressive care  -place NSTEMI protocol  -continue with heparin  -resume metoprolol,statin,asa -echo and cardiology consult in am  -npo for possible cath   Leukocytosis -related to stress response  -continue to monitor counts   HLD -continue statin  -check lipid panel   HTN -currently stable on bb   GERD -ppi /carafate   Mild hyperglycemia -check a1c  -monitor fs   DVT prophylaxis:  Heparin drip Code Status: full Family Communication:  n/a Disposition Plan: patient  expected to be admitted greater than 2 midnights Consults called: Cardiology ,per wife patient has seen [facility] in the past Dr Clayborn Bigness was consulted.  Admission status: inpatient  Clance Boll MD [facility]   If 7PM-7AM, please contact night-coverage [email or web address] Password [facility]  09/19/2020, 9:26 PM

## 2020-09-19 NOTE — ED Provider Notes (Signed)
[facility] Emergency Department Provider Note   ____________________________________________   Event Date/Time   First MD Initiated Contact with Patient 09/19/20 1617     (approximate)  I have reviewed the triage vital signs and the nursing notes.   HISTORY  Chief Complaint Chest Pain    HPI Derrick Stone is a 70 y.o. male with a stated past medical history of CAD with stent placement 15 years ago who presents for intermittent substernal chest pressure that began approximately 2 weeks prior to arrival.  Patient states that this pain originally began to occur while he was playing golf and partially relieved at rest.  However, this evening patient started having this substernal chest pressure after eating.  Patient was concerned that this was possible reflux and has been taking Pepcid to try to relieve this pain.  Patient states that it resolved spontaneously approximately 1 hour prior to arrival.  Patient denies any pain or shortness of breath at this time.  Of note, wife at bedside states that during this most recent episode of chest pain, patient had diaphoresis and became "pale and clammy".  Patient currently denies any vision changes, tinnitus, difficulty speaking, facial droop, sore throat, chest pain, shortness of breath, abdominal pain, nausea/vomiting/diarrhea, dysuria, or weakness/numbness/paresthesias in any extremity         Past Medical History:  Diagnosis Date  . CAD (coronary artery disease)   . History of chickenpox   . History of mumps   . Hyperlipidemia   . Hypertension     Patient Active Problem List   Diagnosis Date Noted  . Pre-diabetes 04/13/2017  . Stricture and stenosis of esophagus 04/10/2017  . Benign essential hypertension 10/09/2013  . Pure hypercholesterolemia 11/27/2006  . 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 Coronary atherosclerosis of native coronary artery 11/11/2006  . Acid reflux 07/17/2003    Past Surgical  History:  Procedure Laterality Date  . Heart artery stent  2008   x2  . HERNIA REPAIR  2016    Prior to Admission medications   Medication Sig Start Date End Date Taking? Authorizing Provider  aspirin EC 81 MG tablet Take by mouth. 11/27/06  Yes [provider]  atorvastatin (LIPITOR) 40 MG tablet TAKE 1 TABLET (40 MG TOTAL) BY MOUTH ONCE DAILY. 05/27/17  Yes [provider]  escitalopram (LEXAPRO) 10 MG tablet TAKE 1 TABLET (10 MG TOTAL) BY MOUTH ONCE DAILY. 05/27/17  Yes [provider]  metoprolol succinate (TOPROL-XL) 50 MG 24 hr tablet Take 1 tablet by mouth daily. 11/27/06  Yes [provider]  mometasone (NASONEX) 50 MCG/ACT nasal spray Place 2 sprays into the nose daily. As needed 11/27/06  Yes [provider]  Omega-3 Fatty Acids (FISH OIL PO) Take 1 capsule by mouth daily.   Yes [provider]  omeprazole (PRILOSEC OTC) 20 MG tablet Take 1 tablet by mouth daily. 11/27/06  Yes [provider]  sucralfate (CARAFATE) 1 g tablet Take 1 tablet (1 g total) by mouth 4 (four) times daily -  before meals and at bedtime. 09/19/20   Jerrol Banana., MD    Allergies Gold-containing drug products and Nitroglycerin  Family History  Problem Relation Age of Onset  . Melanoma Mother   . Diabetes Father   . Bone cancer Father   . Testicular cancer Brother   . Heart disease Paternal Grandfather     Social History Social History   Tobacco Use  . Smoking status: Never Smoker  .  Smokeless tobacco: Never Used  Vaping Use  . Vaping Use: Never used  Substance Use Topics  . Alcohol use: No  . Drug use: No    Review of Systems Constitutional: No fever/chills Eyes: No visual changes. ENT: No sore throat. Cardiovascular: Endorses chest pain. Respiratory: Denies shortness of breath. Gastrointestinal: No abdominal pain.  No nausea, no vomiting.  No diarrhea. Genitourinary: Negative for dysuria. Musculoskeletal: Negative  for acute arthralgias Skin: Negative for rash. Neurological: Negative for headaches, weakness/numbness/paresthesias in any extremity Psychiatric: Negative for suicidal ideation/homicidal ideation   ____________________________________________   PHYSICAL EXAM:  VITAL SIGNS: ED Triage Vitals  Enc Vitals Group     BP 09/19/20 1614 (!) 122/97     Pulse Rate 09/19/20 1614 83     Resp 09/19/20 1614 20     Temp 09/19/20 1614 97.8 F (36.6 C)     Temp Source 09/19/20 1614 Oral     SpO2 09/19/20 1614 100 %     Weight 09/19/20 1611 233 lb (105.7 kg)     Height 09/19/20 1611 6\' 1"  (1.854 m)     Head Circumference --      Peak Flow --      Pain Score 09/19/20 1611 0     Pain Loc --      Pain Edu? --      Excl. in [place]? --    Constitutional: Alert and oriented. Well appearing and in no acute distress. Eyes: Conjunctivae are normal. PERRL. Head: Atraumatic. Nose: No congestion/rhinnorhea. Mouth/Throat: Mucous membranes are moist. Neck: No stridor Cardiovascular: Grossly normal heart sounds.  Good peripheral circulation. Respiratory: Normal respiratory effort.  No retractions. Gastrointestinal: Soft and nontender. No distention. Musculoskeletal: No obvious deformities Neurologic:  Normal speech and language. No gross focal neurologic deficits are appreciated. Skin:  Skin is warm and dry. No rash noted. Psychiatric: Mood and affect are normal. Speech and behavior are normal.  ____________________________________________   LABS (all labs ordered are listed, but only abnormal results are displayed)  Labs Reviewed  COMPREHENSIVE METABOLIC PANEL - Abnormal; Notable for the following components:      Result Value   Glucose, Bld 183 (*)    Alkaline Phosphatase 37 (*)    All other components within normal limits  CBC WITH DIFFERENTIAL/PLATELET - Abnormal; Notable for the following components:   WBC 12.5 (*)    Platelets 148 (*)    Neutro Abs 8.1 (*)    All other components within  normal limits  TROPONIN I (HIGH SENSITIVITY) - Abnormal; Notable for the following components:   Troponin I (High Sensitivity) 545 (*)    All other components within normal limits  TROPONIN I (HIGH SENSITIVITY) - Abnormal; Notable for the following components:   Troponin I (High Sensitivity) 614 (*)    All other components within normal limits  RESP PANEL BY RT-PCR (FLU A&B, COVID) ARPGX2  BRAIN NATRIURETIC PEPTIDE  APTT  PROTIME-INR  HEPARIN LEVEL (UNFRACTIONATED)  CBG MONITORING, ED   ____________________________________________  EKG  ED ECG REPORT I, Naaman Plummer, the attending physician, personally viewed and interpreted this ECG.  Date: 09/19/2020 EKG Time: 1757 Rate: 80 Rhythm: normal sinus rhythm QRS Axis: normal Intervals: normal ST/T Wave abnormalities: normal Narrative Interpretation: no evidence of acute ischemia  ____________________________________________  RADIOLOGY  ED MD interpretation: One-view portable chest x-ray shows no evidence of acute abnormalities including no pneumonia, pneumothorax, or widened mediastinum  Official radiology report(s): DG Chest Port 1 View  Result Date: 09/19/2020 CLINICAL DATA:  Chest pain EXAM: PORTABLE CHEST 1 VIEW COMPARISON:  11/11/2006 FINDINGS: Lungs are clear.  No pleural effusion or pneumothorax. Heart is normal in size. IMPRESSION: No evidence of acute cardiopulmonary disease. Electronically Signed   By: Julian Hy M.D.   On: 09/19/2020 16:35    ____________________________________________   PROCEDURES  Procedure(s) performed (including Critical Care):  .1-3 Lead EKG Interpretation Performed by: Naaman Plummer, MD Authorized by: Naaman Plummer, MD     Interpretation: normal     ECG rate:  75   ECG rate assessment: normal     Rhythm: sinus rhythm     Ectopy: none     Conduction: normal   .Critical Care Performed by: Naaman Plummer, MD Authorized by: Naaman Plummer, MD   Critical care  provider statement:    Critical care time (minutes):  35   Critical care time was exclusive of:  Separately billable procedures and treating other patients   Critical care was necessary to treat or prevent imminent or life-threatening deterioration of the following conditions:  Cardiac failure   Critical care was time spent personally by me on the following activities:  Discussions with consultants, evaluation of patient's response to treatment, examination of patient, ordering and performing treatments and interventions, ordering and review of laboratory studies, ordering and review of radiographic studies, pulse oximetry, re-evaluation of patient's condition, obtaining history from patient or surrogate and review of old charts     ____________________________________________   INITIAL IMPRESSION / [employer] / ED COURSE  As part of my medical decision making, I reviewed the following data within the [place] notes reviewed and incorporated, Labs reviewed, EKG interpreted, Old chart reviewed, Radiograph reviewed and Notes from prior ED visits reviewed and incorporated        Workup: ECG, CXR, CBC, CMP, Troponin Findings: ECG: No overt evidence of STEMI. No evidence of Brugadas sign, delta wave, epsilon wave, significantly prolonged QTc, or malignant arrhythmia Troponin: 545 Other Labs unremarkable for emergent problems. CXR: Without PTX, PNA, or widened mediastinum Last Stress Test: 15 years ago Last Heart Catheterization: 15 years ago HEART Score: 5  Given History, Exam, and Workup concern for NSTEMI.  I have low suspicion for Pneumothorax, Pneumonia, Pulmonary Embolus, Tamponade, Aortic Dissection  Interventions: ASA 324or325mg  Heparin Bolus 60-70u/kg (max 5000) Heparin gtt about 12-15u/kg/hr (max 1000/hr) PRN analgesia with fentanyl, morphine PRN antiemetic therapy  Dispo: Admit       ____________________________________________   FINAL CLINICAL IMPRESSION(S) / ED DIAGNOSES  Final diagnoses:  NSTEMI (non-ST elevated myocardial infarction) ([place])  Unstable angina pectoris [facility])     ED Discharge Orders    None       Note:  This document was prepared using Dragon voice recognition software and may include unintentional dictation errors.   Naaman Plummer, MD 09/19/20 [phone]

## 2020-09-19 NOTE — Progress Notes (Signed)
I,Derrick Stone,acting as a scribe for Derrick Durie, MD.,have documented all relevant documentation on the behalf of Derrick Durie, MD,as directed by  Derrick Durie, MD while in the presence of Derrick Durie, MD.   Established patient visit   Patient: Derrick Stone   DOB: [birth year]   70 y.o. Male  MRN: [id number] Visit Date: 09/19/2020  Today's healthcare provider: [name], MD   Chief Complaint  Patient presents with  . Chest Pain   Subjective    Chest Pain  This is a new problem. The current episode started in the past 7 days (3 days ago). The onset quality is undetermined. The problem occurs intermittently. The problem has been waxing and waning. The pain is present in the epigastric region. The pain is at a severity of 4/10. The pain is mild. The quality of the pain is described as burning and tightness. The pain radiates to the right arm and left arm. Associated symptoms include exertional chest pressure. Pertinent negatives include no abdominal pain, back pain, claudication, cough, diaphoresis, dizziness, fever, headaches, hemoptysis, irregular heartbeat, leg pain, lower extremity edema, malaise/fatigue, nausea, near-syncope, numbness, orthopnea, palpitations, PND, shortness of breath, sputum production, syncope, vomiting or weakness. The pain is aggravated by food and movement.    Patient states he was playing golf 3 days ago and began having tightness across his chest. Patient states tightness has been intermittent for past 3 days. He is unsure it it is acid reflux or heart related. Patient has no symptoms of dizziness, no shortness of breath. Patient has had several episodes of heavy reflux this weekend. Patient s states he contacted Dr. Ubaldo Stone this morning who advised him to contact our office.    Patient was playing golf over the weekend and developed nonexertional lower chest tightness.  He had no associated symptoms of diaphoresis presyncope  syncope or nausea.  He went home over the weekend and then had a bunch of reflux symptoms with belching and postprandial symptoms.  He walked in from the parking lot earlier this morning and came up with the flight of stairs without any symptoms.      Medications: Outpatient Medications Prior to Visit  Medication Sig  . aspirin EC 81 MG tablet Take by mouth.  Marland Kitchen atorvastatin (LIPITOR) 40 MG tablet TAKE 1 TABLET (40 MG TOTAL) BY MOUTH ONCE DAILY.  Marland Kitchen escitalopram (LEXAPRO) 10 MG tablet TAKE 1 TABLET (10 MG TOTAL) BY MOUTH ONCE DAILY.  . metoprolol succinate (TOPROL-XL) 50 MG 24 hr tablet Take 1 tablet by mouth daily.  . mometasone (NASONEX) 50 MCG/ACT nasal spray Place 2 sprays into the nose daily. As needed  . Omega-3 Fatty Acids (FISH OIL PO) Take 1 capsule by mouth daily.  Marland Kitchen omeprazole (PRILOSEC OTC) 20 MG tablet Take 1 tablet by mouth daily.   No facility-administered medications prior to visit.    Review of Systems  Constitutional: Negative for appetite change, chills, diaphoresis, fever and malaise/fatigue.  Respiratory: Positive for chest tightness. Negative for cough, hemoptysis, sputum production, shortness of breath and wheezing.   Cardiovascular: Negative for chest pain, palpitations, orthopnea, claudication, syncope, PND and near-syncope.  Gastrointestinal: Negative for abdominal pain, nausea and vomiting.  Musculoskeletal: Negative for back pain.  Neurological: Negative for dizziness, weakness, numbness and headaches.        Objective    BP 120/77 (BP Location: Right Arm, Patient Position: Sitting, Cuff Size: Large)   Pulse 75   Temp 98.1 F (  36.7 C) (Oral)   Resp 18   Wt 233 lb (105.7 kg)   SpO2 95%   BMI 30.74 kg/m  BP Readings from Last 3 Encounters:  09/19/20 (!) 122/97  09/19/20 120/77  04/08/20 122/72   Wt Readings from Last 3 Encounters:  09/19/20 233 lb (105.7 kg)  09/19/20 233 lb (105.7 kg)  08/31/19 239 lb (108.4 kg)       Physical Exam Vitals  reviewed.  HENT:     Head: Normocephalic and atraumatic.  Cardiovascular:     Rate and Rhythm: Normal rate and regular rhythm.     Heart sounds: Normal heart sounds.  Pulmonary:     Effort: Pulmonary effort is normal.     Breath sounds: Normal breath sounds.  Abdominal:     Palpations: Abdomen is soft.     Comments: No tenderness and negative Murphy sign.  Musculoskeletal:     Right lower leg: No edema.     Left lower leg: No edema.  Skin:    General: Skin is warm and dry.  Neurological:     General: No focal deficit present.     Mental Status: He is alert and oriented to person, place, and time.     ECG reveals normal sinus rhythm with at 72 bpm with possible minimal nonspecific ST depression inferiorly and anterior laterally  No results found for any visits on 09/19/20.  Assessment & Plan     1. Chest tightness Patient has a history of heart disease so will refer to cardiology.  At this time will increase aspirin 81 mg to 162 daily until seen.  Will obtain stat labs.  As noted above patient has no exertional symptoms at all when he is exerting himself over the weekend.  Patient is comfortable with this plan and if he develops any other symptoms will go directly to the emergency room. - EKG 12-Lead - Ambulatory referral to Cardiology - US ABDOMEN LIMITED RUQ (LIVER/GB) - CBC w/Diff/Platelet - Comprehensive Metabolic Panel (CMET) - Troponin T  2. Gastroesophageal reflux disease, unspecified whether esophagitis present At this time Carafate 1 g before meals.  We will also obtain gallbladder ultrasound in addition to cardiology referral.  Follow-up with Dr. [name] next week. - US ABDOMEN LIMITED RUQ (LIVER/GB) - CBC w/Diff/Platelet - Comprehensive Metabolic Panel (CMET) - Troponin T  3. Benign essential hypertension  - CBC w/Diff/Platelet - Comprehensive Metabolic Panel (CMET) - Troponin T   Return in about 1 week (around 09/26/2020).      I, Derrick Durie, MD,  have reviewed all documentation for this visit. The documentation on 09/19/20 for the exam, diagnosis, procedures, and orders are all accurate and complete.    Richard Cranford Mon, MD  [facility] [phone] (phone) [phone] (fax)  [place]

## 2020-09-19 NOTE — ED Notes (Signed)
Pt denies chest pain at the moment. Pt states when he does have the chest pain its across his chest.

## 2020-09-19 NOTE — Consult Note (Signed)
ANTICOAGULATION CONSULT NOTE - Initial Consult  Pharmacy Consult for Heparin Infusion Indication: chest pain/ACS  Allergies  Allergen Reactions  . Gold-Containing Drug Products   . Nitroglycerin Other (See Comments)    Passed out when given to him once    Patient Measurements: Height: 6\' 1"  (185.4 cm) Weight: 105.7 kg (233 lb) IBW/kg (Calculated) : 79.9 Heparin Dosing Weight: 101.6 kg  Vital Signs: Temp: 97.8 F (36.6 C) (03/07 1614) Temp Source: Oral (03/07 1614) BP: 114/70 (03/07 2100) Pulse Rate: 72 (03/07 2100)  Labs: Recent Labs    09/19/20 1639 09/19/20 1815 09/19/20 2326  HGB 14.8  --   --   HCT 43.0  --   --   PLT 148*  --   --   APTT  --  29  --   LABPROT  --  12.5  --   INR  --  1.0  --   HEPARINUNFRC  --   --  0.47  CREATININE 0.97  --   --   TROPONINIHS 545* 614*  --     Estimated Creatinine Clearance: 94.3 mL/min (by C-G formula based on SCr of 0.97 mg/dL).   Medical History: Past Medical History:  Diagnosis Date  . CAD (coronary artery disease)   . History of chickenpox   . History of mumps   . Hyperlipidemia   . Hypertension     Medications:  (Not in a hospital admission)  Scheduled:  . [START ON 09/20/2020] aspirin EC  81 mg Oral Daily  . [START ON 09/20/2020] atorvastatin  80 mg Oral Daily  . [START ON 09/20/2020] escitalopram  10 mg Oral Daily  . [START ON 09/20/2020] metoprolol succinate  50 mg Oral Daily  . [START ON 09/20/2020] omega-3 acid ethyl esters  2 g Oral BID  . [START ON 09/20/2020] pantoprazole  40 mg Oral Daily  . sucralfate  1 g Oral TID AC & HS   Infusions:  . sodium chloride 75 mL/hr at 09/19/20 2317  . heparin 1,300 Units/hr (09/19/20 1828)   PRN:  Anti-infectives (From admission, onward)   None      Assessment: Pharmacy has been consulted to initiate and monitor Heparin Infusion in 70yo patient complaining of chest pain. Current troponin level of 545. No prior history of anticoagulant use. Baseline labs have been  ordered and are pending.  03/07 2326 HL = 0.47, therapeutic   Goal of Therapy:  Heparin level 0.3-0.7 units/ml Monitor platelets by anticoagulation protocol: Yes   Plan:  Continue heparin infusion at 1300 units/hr. Recheck HL with AM labs to confirm, then daily while on heparin. Continue to monitor CBC daily while on heparin.  Renda Rolls, PharmD, [facility] 09/19/2020 11:59 PM

## 2020-09-19 NOTE — ED Triage Notes (Signed)
Pt comes into the ED via EMS from home with c/o epigastric pain for the past 2-3 days, today had chest pain while walking to the mailbox,  Denies pain at present #[id number] ASA 324mg  134/87 95 100%RA [id number] Allergic to nitro

## 2020-09-19 NOTE — ED Triage Notes (Signed)
Pt via EMS from home. Pt c/o generalized CP since Friday, pt states that it is worse today. Pt thinks it is acid reflux. Denies abdominal pain. Pt states the pain is worse when he eats something. Pt tried taking his Prilosec with no relief. Pt states he does have some nausea. Denies SOB/cough. Pt is A&Ox4 and NAD.

## 2020-09-19 NOTE — Consult Note (Signed)
ANTICOAGULATION CONSULT NOTE - Initial Consult  Pharmacy Consult for Heparin Infusion Indication: chest pain/ACS  Allergies  Allergen Reactions  . Gold-Containing Drug Products   . Nitroglycerin Other (See Comments)    Passed out when given to him once    Patient Measurements: Height: 6\' 1"  (185.4 cm) Weight: 105.7 kg (233 lb) IBW/kg (Calculated) : 79.9 Heparin Dosing Weight: 101.6 kg  Vital Signs: Temp: 97.8 F (36.6 C) (03/07 1614) Temp Source: Oral (03/07 1614) BP: 115/82 (03/07 1700) Pulse Rate: 79 (03/07 1700)  Labs: Recent Labs    09/19/20 1639  HGB 14.8  HCT 43.0  PLT 148*  CREATININE 0.97  TROPONINIHS 545*    Estimated Creatinine Clearance: 94.3 mL/min (by C-G formula based on SCr of 0.97 mg/dL).   Medical History: Past Medical History:  Diagnosis Date  . CAD (coronary artery disease)   . History of chickenpox   . History of mumps   . Hyperlipidemia   . Hypertension     Medications:  (Not in a hospital admission)  Scheduled:  . heparin  4,000 Units Intravenous Once  . LORazepam  0.5 mg Intravenous Once   Infusions:  . heparin     PRN:  Anti-infectives (From admission, onward)   None      Assessment: Pharmacy has been consulted to initiate and monitor Heparin Infusion in 70yo patient complaining of chest pain. Current troponin level of 545. No prior history of anticoagulant use. Baseline labs have been ordered and are pending.  Goal of Therapy:  Heparin level 0.3-0.7 units/ml Monitor platelets by anticoagulation protocol: Yes   Plan:  Give 4000 units bolus x 1 Start heparin infusion at 1300 units/hr Check anti-Xa level in 6 hours and daily while on heparin Continue to monitor H&H and platelets  Derrick Stone A Derrick Stone 09/19/2020,5:55 PM

## 2020-09-20 ENCOUNTER — Encounter: Payer: Self-pay | Admitting: Internal Medicine

## 2020-09-20 ENCOUNTER — Encounter
Admission: EM | Disposition: A | Discharge status: Home / Self Care | Payer: Self-pay | Source: Home / Self Care | Attending: Internal Medicine

## 2020-09-20 DIAGNOSIS — I10 Essential (primary) hypertension: Secondary | ICD-10-CM

## 2020-09-20 DIAGNOSIS — D72829 Elevated white blood cell count, unspecified: Secondary | ICD-10-CM

## 2020-09-20 DIAGNOSIS — R739 Hyperglycemia, unspecified: Secondary | ICD-10-CM

## 2020-09-20 DIAGNOSIS — K219 Gastro-esophageal reflux disease without esophagitis: Secondary | ICD-10-CM

## 2020-09-20 HISTORY — PX: LEFT HEART CATH AND CORONARY ANGIOGRAPHY: CATH118249

## 2020-09-20 HISTORY — PX: CORONARY STENT INTERVENTION: CATH118234

## 2020-09-20 LAB — BASIC METABOLIC PANEL
Anion gap: 9 (ref 5–15)
BUN: 13 mg/dL (ref 8–23)
CO2: 24 mmol/L (ref 22–32)
Calcium: 8.9 mg/dL (ref 8.9–10.3)
Chloride: 104 mmol/L (ref 98–111)
Creatinine, Ser: 0.99 mg/dL (ref 0.61–1.24)
GFR, Estimated: >60 mL/min (ref >60)
Glucose, Bld: 123 mg/dL — ABNORMAL HIGH (ref 70–99)
Potassium: 3.9 mmol/L (ref 3.5–5.1)
Sodium: 137 mmol/L (ref 135–145)

## 2020-09-20 LAB — CBC WITH DIFFERENTIAL/PLATELET
Basophils Absolute: 0.1 10*3/uL (ref 0.0–0.2)
Basos: 1 %
EOS (ABSOLUTE): 0.4 10*3/uL (ref 0.0–0.4)
Eos: 3 %
Hematocrit: 43.9 % (ref 37.5–51.0)
Hemoglobin: 15.3 g/dL (ref 13.0–17.7)
Immature Grans (Abs): 0 10*3/uL (ref 0.0–0.1)
Immature Granulocytes: 0 %
Lymphocytes Absolute: 4.1 10*3/uL — ABNORMAL HIGH (ref 0.7–3.1)
Lymphs: 28 %
MCH: 30.5 pg (ref 26.6–33.0)
MCHC: 34.9 g/dL (ref 31.5–35.7)
MCV: 88 fL (ref 79–97)
Monocytes Absolute: 1.6 10*3/uL — ABNORMAL HIGH (ref 0.1–0.9)
Monocytes: 11 %
Neutrophils Absolute: 8.4 10*3/uL — ABNORMAL HIGH (ref 1.4–7.0)
Neutrophils: 57 %
Platelets: 163 10*3/uL (ref 150–450)
RBC: 5.02 x10E6/uL (ref 4.14–5.80)
RDW: 12.3 % (ref 11.6–15.4)
WBC: 14.6 10*3/uL — ABNORMAL HIGH (ref 3.4–10.8)

## 2020-09-20 LAB — CBC
HCT: 41.3 % (ref 39.0–52.0)
Hemoglobin: 14 g/dL (ref 13.0–17.0)
MCH: 30.3 pg (ref 26.0–34.0)
MCHC: 33.9 g/dL (ref 30.0–36.0)
MCV: 89.4 fL (ref 80.0–100.0)
Platelets: 152 10*3/uL (ref 150–400)
RBC: 4.62 MIL/uL (ref 4.22–5.81)
RDW: 12.5 % (ref 11.5–15.5)
WBC: 19.6 10*3/uL — ABNORMAL HIGH (ref 4.0–10.5)
nRBC: 0 % (ref 0.0–0.2)

## 2020-09-20 LAB — COMPREHENSIVE METABOLIC PANEL
ALT: 27 IU/L (ref 0–44)
AST: 35 IU/L (ref 0–40)
Albumin/Globulin Ratio: 1.4 (ref 1.2–2.2)
Albumin: 4.7 g/dL (ref 3.8–4.8)
Alkaline Phosphatase: 48 IU/L (ref 44–121)
BUN/Creatinine Ratio: 12 (ref 10–24)
BUN: 13 mg/dL (ref 8–27)
Bilirubin Total: 0.5 mg/dL (ref 0.0–1.2)
CO2: 20 mmol/L (ref 20–29)
Calcium: 9.7 mg/dL (ref 8.6–10.2)
Chloride: 101 mmol/L (ref 96–106)
Creatinine, Ser: 1.09 mg/dL (ref 0.76–1.27)
Globulin, Total: 3.3 g/dL (ref 1.5–4.5)
Glucose: 96 mg/dL (ref 65–99)
Potassium: 4.2 mmol/L (ref 3.5–5.2)
Sodium: 140 mmol/L (ref 134–144)
Total Protein: 8 g/dL (ref 6.0–8.5)
eGFR: 74 mL/min/{1.73_m2} (ref >59)

## 2020-09-20 LAB — LIPID PANEL
Cholesterol: 144 mg/dL (ref 0–200)
HDL: 36 mg/dL — ABNORMAL LOW (ref >40)
LDL Cholesterol: 90 mg/dL (ref 0–99)
Total CHOL/HDL Ratio: 4 RATIO
Triglycerides: 92 mg/dL (ref <150)
VLDL: 18 mg/dL (ref 0–40)

## 2020-09-20 LAB — GLUCOSE, CAPILLARY: Glucose-Capillary: 138 mg/dL — ABNORMAL HIGH (ref 70–99)

## 2020-09-20 LAB — TSH: TSH: 4.456 u[IU]/mL (ref 0.350–4.500)

## 2020-09-20 LAB — PROTIME-INR
INR: 1 (ref 0.8–1.2)
Prothrombin Time: 13 seconds (ref 11.4–15.2)

## 2020-09-20 LAB — TROPONIN T: Troponin T (Highly Sensitive): 91 ng/L (ref 0–22)

## 2020-09-20 LAB — HEPARIN LEVEL (UNFRACTIONATED): Heparin Unfractionated: 0.46 IU/mL (ref 0.30–0.70)

## 2020-09-20 LAB — HIV ANTIBODY (ROUTINE TESTING W REFLEX): HIV Screen 4th Generation wRfx: NONREACTIVE

## 2020-09-20 SURGERY — LEFT HEART CATH AND CORONARY ANGIOGRAPHY
Anesthesia: Moderate Sedation

## 2020-09-20 MED ORDER — SODIUM CHLORIDE 0.9 % WEIGHT BASED INFUSION: 1.0000 mL/kg/h | INTRAVENOUS | Status: AC

## 2020-09-20 MED ORDER — ASPIRIN 81 MG PO CHEW
CHEWABLE_TABLET | ORAL | Status: AC
  Filled 2020-09-20: qty 4

## 2020-09-20 MED ORDER — SODIUM CHLORIDE 0.9 % IV SOLN
INTRAVENOUS | Status: AC | PRN
  Administered 2020-09-20 (×2): 1.75 mg/kg/h via INTRAVENOUS

## 2020-09-20 MED ORDER — SODIUM CHLORIDE 0.9% FLUSH: 3.0000 mL | INTRAVENOUS | Status: DC | PRN

## 2020-09-20 MED ORDER — ONDANSETRON HCL 4 MG/2ML IJ SOLN: 4.0000 mg | Freq: Four times a day (QID) | INTRAMUSCULAR | Status: DC | PRN

## 2020-09-20 MED ORDER — BIVALIRUDIN BOLUS VIA INFUSION - CUPID
INTRAVENOUS | Status: DC | PRN
  Administered 2020-09-20: 79.275 mg via INTRAVENOUS

## 2020-09-20 MED ORDER — TICAGRELOR 90 MG PO TABS
ORAL_TABLET | ORAL | Status: DC | PRN
  Administered 2020-09-20: 180 mg via ORAL

## 2020-09-20 MED ORDER — SODIUM CHLORIDE 0.9 % IV SOLN: 250.0000 mL | INTRAVENOUS | Status: DC | PRN

## 2020-09-20 MED ORDER — SODIUM CHLORIDE 0.9% FLUSH
3.0000 mL | Freq: Two times a day (BID) | INTRAVENOUS | Status: DC
  Administered 2020-09-21: 3 mL via INTRAVENOUS

## 2020-09-20 MED ORDER — ASPIRIN 81 MG PO CHEW
81.0000 mg | CHEWABLE_TABLET | Freq: Every day | ORAL | Status: DC
  Administered 2020-09-21: 81 mg via ORAL
  Filled 2020-09-20: qty 1

## 2020-09-20 MED ORDER — BIVALIRUDIN TRIFLUOROACETATE 250 MG IV SOLR
INTRAVENOUS | Status: AC
  Filled 2020-09-20: qty 250

## 2020-09-20 MED ORDER — FENTANYL CITRATE (PF) 100 MCG/2ML IJ SOLN
INTRAMUSCULAR | Status: AC
  Filled 2020-09-20: qty 2

## 2020-09-20 MED ORDER — MIDAZOLAM HCL 2 MG/2ML IJ SOLN
INTRAMUSCULAR | Status: DC | PRN
  Administered 2020-09-20: 1 mg via INTRAVENOUS

## 2020-09-20 MED ORDER — IOHEXOL 300 MG/ML  SOLN
INTRAMUSCULAR | Status: DC | PRN
  Administered 2020-09-20: 260 mL

## 2020-09-20 MED ORDER — ONDANSETRON HCL 4 MG/2ML IJ SOLN
INTRAMUSCULAR | Status: DC | PRN
  Administered 2020-09-20: 4 mg via INTRAVENOUS

## 2020-09-20 MED ORDER — ASPIRIN 81 MG PO CHEW
CHEWABLE_TABLET | ORAL | Status: DC | PRN
  Administered 2020-09-20: 243 mg via ORAL

## 2020-09-20 MED ORDER — HEPARIN (PORCINE) IN NACL 1000-0.9 UT/500ML-% IV SOLN
INTRAVENOUS | Status: AC
  Filled 2020-09-20: qty 1000

## 2020-09-20 MED ORDER — SODIUM CHLORIDE 0.9% FLUSH
3.0000 mL | INTRAVENOUS | Status: DC | PRN
Start: 2020-09-20 — End: 2020-09-20

## 2020-09-20 MED ORDER — LABETALOL HCL 5 MG/ML IV SOLN: 10.0000 mg | INTRAVENOUS | Status: AC | PRN

## 2020-09-20 MED ORDER — ASPIRIN 81 MG PO CHEW: 81.0000 mg | CHEWABLE_TABLET | ORAL | Status: DC

## 2020-09-20 MED ORDER — SODIUM CHLORIDE 0.9 % WEIGHT BASED INFUSION: 1.0000 mL/kg/h | INTRAVENOUS | Status: DC

## 2020-09-20 MED ORDER — TICAGRELOR 90 MG PO TABS
ORAL_TABLET | ORAL | Status: AC
  Filled 2020-09-20: qty 2

## 2020-09-20 MED ORDER — SODIUM CHLORIDE 0.9 % WEIGHT BASED INFUSION: 3.0000 mL/kg/h | INTRAVENOUS | Status: DC

## 2020-09-20 MED ORDER — MIDAZOLAM HCL 2 MG/2ML IJ SOLN
INTRAMUSCULAR | Status: AC
  Filled 2020-09-20: qty 2

## 2020-09-20 MED ORDER — LIDOCAINE HCL (PF) 1 % IJ SOLN
INTRAMUSCULAR | Status: AC
  Filled 2020-09-20: qty 30

## 2020-09-20 MED ORDER — ONDANSETRON HCL 4 MG/2ML IJ SOLN
INTRAMUSCULAR | Status: AC
  Filled 2020-09-20: qty 2

## 2020-09-20 MED ORDER — HEPARIN (PORCINE) IN NACL 1000-0.9 UT/500ML-% IV SOLN
INTRAVENOUS | Status: DC | PRN
  Administered 2020-09-20 (×2): 500 mL

## 2020-09-20 MED ORDER — SODIUM CHLORIDE 0.9% FLUSH
3.0000 mL | Freq: Two times a day (BID) | INTRAVENOUS | Status: DC
  Administered 2020-09-20 – 2020-09-21 (×3): 3 mL via INTRAVENOUS

## 2020-09-20 MED ORDER — ACETAMINOPHEN 325 MG PO TABS
650.0000 mg | ORAL_TABLET | ORAL | Status: DC | PRN
  Administered 2020-09-20: 650 mg via ORAL

## 2020-09-20 MED ORDER — LORAZEPAM 2 MG/ML IJ SOLN
0.5000 mg | Freq: Once | INTRAMUSCULAR | Status: AC
  Administered 2020-09-20: 0.5 mg via INTRAVENOUS
  Filled 2020-09-20: qty 1

## 2020-09-20 MED ORDER — FENTANYL CITRATE (PF) 100 MCG/2ML IJ SOLN
INTRAMUSCULAR | Status: DC | PRN
  Administered 2020-09-20: 50 ug via INTRAVENOUS

## 2020-09-20 MED ORDER — TICAGRELOR 90 MG PO TABS
90.0000 mg | ORAL_TABLET | Freq: Two times a day (BID) | ORAL | Status: DC
  Administered 2020-09-20 – 2020-09-21 (×2): 90 mg via ORAL
  Filled 2020-09-20 (×2): qty 1

## 2020-09-20 MED ORDER — HYDRALAZINE HCL 20 MG/ML IJ SOLN: 10.0000 mg | INTRAMUSCULAR | Status: AC | PRN

## 2020-09-20 MED ORDER — LIDOCAINE HCL (PF) 1 % IJ SOLN
INTRAMUSCULAR | Status: DC | PRN
  Administered 2020-09-20: 30 mL

## 2020-09-20 SURGICAL SUPPLY — 18 items
BALLN TREK RX 2.5X15 (BALLOONS) ×2
BALLN ~~LOC~~ TREK RX 3.5X15 (BALLOONS) ×2
BALLOON TREK RX 2.5X15 (BALLOONS) ×1 IMPLANT
BALLOON ~~LOC~~ TREK RX 3.5X15 (BALLOONS) ×1 IMPLANT
CATH EAGLE EYE PLAT IMAGING (CATHETERS) ×2 IMPLANT
CATH INFINITI 5FR JL4 (CATHETERS) ×2 IMPLANT
CATH INFINITI JR4 5F (CATHETERS) ×2 IMPLANT
CATH VISTA GUIDE 6FR XB3.5 SH (CATHETERS) ×2 IMPLANT
DEVICE CLOSURE MYNXGRIP 6/7F (Vascular Products) ×2 IMPLANT
KIT ENCORE 26 ADVANTAGE (KITS) ×2 IMPLANT
NEEDLE PERC 18GX7CM (NEEDLE) ×2 IMPLANT
PACK CARDIAC CATH (CUSTOM PROCEDURE TRAY) ×2 IMPLANT
SET ATX SIMPLICITY (MISCELLANEOUS) ×2 IMPLANT
SHEATH AVANTI 5FR X 11CM (SHEATH) ×2 IMPLANT
SHEATH AVANTI 6FR X 11CM (SHEATH) ×2 IMPLANT
STENT RESOLUTE ONYX 3.5X26 (Permanent Stent) ×2 IMPLANT
WIRE G HI TQ BMW 190 (WIRE) ×4 IMPLANT
WIRE GUIDERIGHT .035X150 (WIRE) ×2 IMPLANT

## 2020-09-20 NOTE — Progress Notes (Signed)
   09/20/20 1845  Assess: MEWS Score  Temp 98.8 F (37.1 C)  BP 111/70  Pulse Rate 86  Resp 20  SpO2 100 %  O2 Device Room Air   Pt arrived to room 260. Pt A&O. Pt wife at bedside. Call bell within reach. Be wheels locked. R femoral site checked. No swelling or redness notified.

## 2020-09-20 NOTE — Hospital Course (Addendum)
70 year old man PMH CAD presented with several day history of chest pain.  Admitted for NSTEMI.  Seen by cardiology with plans for heart catheterization 3/8.  A & P  Acute MI, PMH CAD, PMH remote stent --Pain-free on exam.  Continue aspirin, statin, heparin. --Cardiology planning left heart catheterization today.  Further recommendations and management per cardiology.  Hyperlipidemia --Continue Lipitor  Essential HTN --Stable.  Resume Toprol-XL.  GERD --Continue PPI, carafate  Modest hyperglycemia --Check hemoglobin A1c in a.m., follow-up as an outpatient  Apparent chronic leukocytosis, elevated this hospitalization and in 2010. --Follow-up as an outpatient recommended.  No signs or symptoms to suggest infection.

## 2020-09-20 NOTE — Consult Note (Signed)
ANTICOAGULATION CONSULT NOTE - Initial Consult  Pharmacy Consult for Heparin Infusion Indication: chest pain/ACS  Allergies  Allergen Reactions  . Gold-Containing Drug Products   . Nitroglycerin Other (See Comments)    Passed out when given to him once    Patient Measurements: Height: 6\' 1"  (185.4 cm) Weight: 105.7 kg (233 lb) IBW/kg (Calculated) : 79.9 Heparin Dosing Weight: 101.6 kg  Vital Signs: BP: 135/86 (03/07 2300) Pulse Rate: 73 (03/08 0400)  Labs: Recent Labs    09/19/20 1639 09/19/20 1815 09/19/20 2326 09/20/20 0518  HGB 14.8  --   --  14.0  HCT 43.0  --   --  41.3  PLT 148*  --   --  152  APTT  --  29  --   --   LABPROT  --  12.5  --  13.0  INR  --  1.0  --  1.0  HEPARINUNFRC  --   --  0.47 0.46  CREATININE 0.97  --   --  0.99  TROPONINIHS 545* 614*  --   --     Estimated Creatinine Clearance: 92.4 mL/min (by C-G formula based on SCr of 0.99 mg/dL).   Medical History: Past Medical History:  Diagnosis Date  . CAD (coronary artery disease)   . History of chickenpox   . History of mumps   . Hyperlipidemia   . Hypertension     Medications:  (Not in a hospital admission)  Scheduled:  . aspirin EC  81 mg Oral Daily  . atorvastatin  80 mg Oral Daily  . escitalopram  10 mg Oral Daily  . metoprolol succinate  50 mg Oral Daily  . omega-3 acid ethyl esters  2 g Oral BID  . pantoprazole  40 mg Oral Daily  . sucralfate  1 g Oral TID AC & HS   Infusions:  . sodium chloride 75 mL/hr at 09/19/20 2317  . heparin 1,300 Units/hr (09/19/20 1828)   PRN:  Anti-infectives (From admission, onward)   None      Assessment: Pharmacy has been consulted to initiate and monitor Heparin Infusion in 70yo patient complaining of chest pain. Current troponin level of 545. No prior history of anticoagulant use. Baseline labs have been ordered and are pending.  03/07 2326 HL = 0.47, therapeutic 03/08 0518 HL = 0.46, therapeutic x 2   Goal of Therapy:  Heparin  level 0.3-0.7 units/ml Monitor platelets by anticoagulation protocol: Yes   Plan:  Continue heparin infusion at 1300 units/hr. Recheck HL with AM labs daily while on heparin. Continue to monitor CBC daily while on heparin.  Renda Rolls, PharmD, [facility] 09/20/2020 6:45 AM

## 2020-09-20 NOTE — Progress Notes (Signed)
PROGRESS NOTE  Derrick Stone:[id number] DOB: September 02, 1950 DOA: 09/19/2020 PCP: Birdie Sons, MD  Brief History    70 year old man PMH CAD presented with several day history of chest pain.  Admitted for NSTEMI.  Seen by cardiology with plans for heart catheterization 3/8.  A & P  Acute MI, PMH CAD, PMH remote stent --Pain-free on exam.  Continue aspirin, statin, heparin. --Cardiology planning left heart catheterization today.  Further recommendations and management per cardiology.  Hyperlipidemia --Continue Lipitor  Essential HTN --Stable.  Resume Toprol-XL.  GERD --Continue PPI, carafate  Modest hyperglycemia --Check hemoglobin A1c in a.m., follow-up as an outpatient  Apparent chronic leukocytosis, elevated this hospitalization and in 2010. --Follow-up as an outpatient recommended.  No signs or symptoms to suggest infection.   Disposition Plan:  Discussion:   Status is: Inpatient  Remains inpatient appropriate because:IV treatments appropriate due to intensity of illness or inability to take PO and Inpatient level of care appropriate due to severity of illness   Dispo: The patient is from: Home              Anticipated d/c is to: Home              Patient currently is not medically stable to d/c.   Difficult to place patient No  DVT prophylaxis: heparin infusion   Code Status: Full Code Level of care: Progressive Cardiac Family Communication: wife at bedside  Murray Hodgkins, MD  [facility] Direct contact: see [email or web address] (further directions at bottom of note if needed) 7PM-7AM contact night coverage as at bottom of note 09/20/2020, 2:26 PM  LOS: 1 day   Significant Hospital Events   .    Consults:  .    Procedures:  .   Significant Diagnostic Tests:  Marland Kitchen    Micro Data:  .    Antimicrobials:  .   Interval History/Subjective  CC: f/u chest pain  No chest pain now Feels good, breathing fine  Objective   Vitals:  Vitals:   09/20/20  1217 09/20/20 1304  BP: 127/81   Pulse: 89   Resp: 12   Temp: 97.9 F (36.6 C)   SpO2: 99% 97%    Exam:  Constitutional:   . Appears calm and comfortable ENMT:  . grossly normal hearing  Respiratory:  . CTA bilaterally, no w/r/r.  . Respiratory effort normal.  Cardiovascular:  . RRR, no m/r/g . No LE extremity edema   Abdomen:  . soft Psychiatric:  . Mental status o Mood, affect appropriate  I have personally reviewed the following:   Today's Data  . BMP stable . LDL 90 . WBC 19.6  Scheduled Meds: . [MAR Hold] aspirin EC  81 mg Oral Daily  . [MAR Hold] atorvastatin  80 mg Oral Daily  . [MAR Hold] escitalopram  10 mg Oral Daily  . [MAR Hold] metoprolol succinate  50 mg Oral Daily  . [MAR Hold] omega-3 acid ethyl esters  2 g Oral BID  . ondansetron      . [MAR Hold] pantoprazole  40 mg Oral Daily  . [MAR Hold] sodium chloride flush  3 mL Intravenous Q12H  . [MAR Hold] sucralfate  1 g Oral TID AC & HS   Continuous Infusions: . bivalirudin (ANGIOMAX) infusion 5 mg/mL 0.25 mg/kg/hr (09/20/20 1419)  . heparin Stopped (09/20/20 1215)    Principal Problem:   NSTEMI (non-ST elevated myocardial infarction) [facility]) Active Problems:   Coronary atherosclerosis of native coronary artery  Acid reflux   Pre-diabetes   Benign essential hypertension   Leukocytosis   LOS: 1 day   How to contact the [facility] Attending or Consulting provider [place] or covering provider during after hours [place], for this patient?  1. Check the care team in [facility] and look for a) attending/consulting [facility] provider listed and b) the [facility] team listed 2. Log into [email or web address] and use [place]'s universal password to access. If you do not have the password, please contact the hospital operator. 3. Locate the [facility] provider you are looking for under [facility] and page to a number that you can be directly reached. 4. If you still have difficulty reaching the provider, please page the [facility]  (Director on Call) for the Hospitalists listed on amion for assistance.

## 2020-09-20 NOTE — ED Notes (Signed)
Report called to [facility] in specials at this time. Per [employer], will come get pt at this time.

## 2020-09-20 NOTE — ED Notes (Signed)
Pt comfortable and resting with his wife

## 2020-09-21 ENCOUNTER — Encounter: Payer: Self-pay | Admitting: Internal Medicine

## 2020-09-21 ENCOUNTER — Inpatient Hospital Stay
Admit: 2020-09-21 | Discharge: 2020-09-21 | Disposition: A | Discharge status: Home / Self Care | Payer: Medicare Other | Attending: Internal Medicine | Admitting: Internal Medicine

## 2020-09-21 ENCOUNTER — Encounter: Payer: Self-pay | Admitting: *Deleted

## 2020-09-21 DIAGNOSIS — I214 Non-ST elevation (NSTEMI) myocardial infarction: Principal | ICD-10-CM

## 2020-09-21 LAB — CBC
HCT: 39.9 % (ref 39.0–52.0)
Hemoglobin: 14 g/dL (ref 13.0–17.0)
MCH: 31 pg (ref 26.0–34.0)
MCHC: 35.1 g/dL (ref 30.0–36.0)
MCV: 88.3 fL (ref 80.0–100.0)
Platelets: 145 10*3/uL — ABNORMAL LOW (ref 150–400)
RBC: 4.52 MIL/uL (ref 4.22–5.81)
RDW: 12.5 % (ref 11.5–15.5)
WBC: 17.5 10*3/uL — ABNORMAL HIGH (ref 4.0–10.5)
nRBC: 0 % (ref 0.0–0.2)

## 2020-09-21 LAB — ECHOCARDIOGRAM COMPLETE
AR max vel: 2.37 cm2
AV Area VTI: 2.68 cm2
AV Area mean vel: 2.24 cm2
AV Mean grad: 3 mmHg
AV Peak grad: 4.8 mmHg
Ao pk vel: 1.1 m/s
Area-P 1/2: 3.34 cm2
Height: 73 in
S' Lateral: 2.76 cm
Weight: 3555.2 oz

## 2020-09-21 LAB — POCT ACTIVATED CLOTTING TIME: Activated Clotting Time: 356 seconds

## 2020-09-21 LAB — HEPARIN LEVEL (UNFRACTIONATED): Heparin Unfractionated: <0.1 IU/mL — ABNORMAL LOW (ref 0.30–0.70)

## 2020-09-21 LAB — HEMOGLOBIN A1C
Hgb A1c MFr Bld: 6.1 % — ABNORMAL HIGH (ref 4.8–5.6)
Mean Plasma Glucose: 128.37 mg/dL

## 2020-09-21 LAB — GLUCOSE, CAPILLARY
Glucose-Capillary: 106 mg/dL — ABNORMAL HIGH (ref 70–99)
Glucose-Capillary: 97 mg/dL (ref 70–99)

## 2020-09-21 MED ORDER — LOSARTAN POTASSIUM 25 MG PO TABS: 25.0000 mg | ORAL_TABLET | Freq: Every day | ORAL | 1 refills | Status: AC

## 2020-09-21 MED ORDER — TICAGRELOR 90 MG PO TABS: 90.0000 mg | ORAL_TABLET | Freq: Two times a day (BID) | ORAL | 11 refills | Status: DC

## 2020-09-21 MED ORDER — ATORVASTATIN CALCIUM 80 MG PO TABS: 80.0000 mg | ORAL_TABLET | Freq: Every day | ORAL | 1 refills | Status: AC

## 2020-09-21 MED ORDER — LOSARTAN POTASSIUM 25 MG PO TABS
25.0000 mg | ORAL_TABLET | Freq: Every day | ORAL | Status: DC
Start: 2020-09-21 — End: 2020-09-21

## 2020-09-21 NOTE — Consult Note (Addendum)
CARDIOLOGY CONSULT NOTE               Patient ID: Derrick Stone MRN: [id number] DOB/AGE: 1951/06/12 70 y.o.  Admit date: 09/19/2020 Referring Physician Dr. Myles Rosenthal hospitalist Primary Physician Juanetta Beets primary Primary Cardiologist Dr. Jordan Hawks Reason for Consultation unstable angina  HPI: Patient is a 70 year old male with known coronary disease previous STEMI 15 years ago with PCI and stent to LAD with DES patient has hypertension hyperlipidemia coronary artery disease GERD and reflux.  The patient states that he has been having intermittent recurrent unstable angina chest pain symptoms in the last 2 to 3 days complains of pain that radiates into his back of his arms worse with exertion he had multiple flares of the same episode initially thought it might be reflux he had some shortness of breath as well.  Thinking back the patient states that symptoms may have started about a month ago but has gotten progressively worse in the last 2 to 3 days patient was finally came to cardiologist office describing what he thought was reflux so he was referred back to his primary physician primary physician was concerned about cardiac issues first and referred him back to cardiology to be seen the next day but the patient had worsening symptoms and came in by rescue to the emergency room with unstable anginal symptoms EKGs showed diffuse ST depression subtly troponins were basically unremarkable he was pain-free at the time I saw him on heparin  Review of systems complete and found to be negative unless listed above     Past Medical History:  Diagnosis Date  . CAD (coronary artery disease)   . History of chickenpox   . History of mumps   . Hyperlipidemia   . Hypertension     Past Surgical History:  Procedure Laterality Date  . Heart artery stent  2008   x2  . HERNIA REPAIR  2016    Medications Prior to Admission  Medication Sig Dispense Refill Last Dose  . aspirin EC 81 MG  tablet Take by mouth.   09/18/2020 at Unknown time  . atorvastatin (LIPITOR) 40 MG tablet TAKE 1 TABLET (40 MG TOTAL) BY MOUTH ONCE DAILY.  5 09/18/2020 at Unknown time  . escitalopram (LEXAPRO) 10 MG tablet TAKE 1 TABLET (10 MG TOTAL) BY MOUTH ONCE DAILY.  1 09/19/2020 at Unknown time  . metoprolol succinate (TOPROL-XL) 50 MG 24 hr tablet Take 1 tablet by mouth daily.   09/19/2020 at Unknown time  . mometasone (NASONEX) 50 MCG/ACT nasal spray Place 2 sprays into the nose daily. As needed   Past Week at Unknown time  . Omega-3 Fatty Acids (FISH OIL PO) Take 1 capsule by mouth daily.   09/19/2020 at Unknown time  . omeprazole (PRILOSEC OTC) 20 MG tablet Take 1 tablet by mouth daily.   09/19/2020 at Unknown time  . sucralfate (CARAFATE) 1 g tablet Take 1 tablet (1 g total) by mouth 4 (four) times daily -  before meals and at bedtime. 120 tablet 3    Social History   Socioeconomic History  . Marital status: Married    Spouse name: Not on file  . Number of children: 2  . Years of education: Not on file  . 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 Smoking status: Never Smoker  . Smokeless tobacco: Never Used  Vaping Use  . Vaping Use: Never used  Substance and Sexual Activity  . Alcohol use: No  . Drug use: No  . Sexual activity: Not on file  Other Topics Concern  . Not on file  Social History Narrative  . Not on file   Social Determinants of Health   Financial Resource Strain: Not on file  Food Insecurity: Not on file  Transportation Needs: Not on file  Physical Activity: Not on file  Stress: Not on file  Social Connections: Not on file  Intimate Partner Violence: Not on file    Family History  Problem Relation Age of Onset  . Melanoma Mother   . Diabetes Father   . Bone cancer Father   . Testicular cancer Brother   . Heart disease Paternal Grandfather       Review  of systems complete and found to be negative unless listed above      PHYSICAL EXAM  General: Well developed, well nourished, in no acute distress HEENT:  Normocephalic and atramatic Neck:  No JVD.  Lungs: Clear bilaterally to auscultation and percussion. Heart: HRRR . Normal S1 and S2 without gallops or murmurs.  Abdomen: Bowel sounds are positive, abdomen soft and non-tender  Msk:  Back normal, normal gait. Normal strength and tone for age. Extremities: No clubbing, cyanosis or edema.   Neuro: Alert and oriented X 3. Psych:  Good affect, responds appropriately  Labs:   Lab Results  Component Value Date   WBC 17.5 (H) 09/21/2020   HGB 14.0 09/21/2020   HCT 39.9 09/21/2020   MCV 88.3 09/21/2020   PLT 145 (L) 09/21/2020    Recent Labs  Lab 09/19/20 1639 09/20/20 0518  NA 137 137  K 3.6 3.9  CL 105 104  CO2 24 24  BUN 14 13  CREATININE 0.97 0.99  CALCIUM 9.6 8.9  PROT 8.0  --   BILITOT 0.7  --   ALKPHOS 37*  --   ALT 26  --   AST 36  --   GLUCOSE 183* 123*   No results found for: CKTOTAL, CKMB, CKMBINDEX, TROPONINI  Lab Results  Component Value Date   CHOL 144 09/20/2020   CHOL 133 09/01/2019   CHOL 144 08/29/2018   Lab Results  Component Value Date   HDL 36 (L) 09/20/2020   HDL 38 (L) 09/01/2019   HDL 38 (L) 08/29/2018   Lab Results  Component Value Date   LDLCALC 90 09/20/2020   LDLCALC 73 09/01/2019   LDLCALC 70 08/29/2018   Lab Results  Component Value Date   TRIG 92 09/20/2020   TRIG 125 09/01/2019   TRIG 178 (H) 08/29/2018   Lab Results  Component Value Date   CHOLHDL 4.0 09/20/2020   CHOLHDL 3.5 09/01/2019   CHOLHDL 3.8 08/29/2018   No results found for: LDLDIRECT    Radiology: [facility] Chest Port 1 View  Result Date: 09/19/2020 CLINICAL DATA:  Chest pain EXAM: PORTABLE CHEST 1 VIEW COMPARISON:  11/11/2006 FINDINGS: Lungs are clear.  No pleural effusion or pneumothorax. Heart is normal in size. IMPRESSION: No evidence of acute  cardiopulmonary disease. Electronically Signed   By: Julian Hy M.D.   On: 09/19/2020 16:35    EKG: Normal sinus rhythm nonspecific T wave changes with diffuse ST depression anteriorly suggestive of possible ischemia  ASSESSMENT AND PLAN:  Unstable angina Known coronary artery disease History of PCI and stent History of acute STEMI Hyperlipidemia Hypertension GERD Anxiety  . Plan Agree  with admit to telemetry follow-up EKGs troponins Recommend beta-blocker consider adding ARB or ACE maintain aspirin and IV heparin for anticoagulation Consider echocardiogram prior to discharge or may be able to be done as an outpatient Continue Lipitor therapy for lipid management Maintain blood pressure control with metoprolol and possibly losartan Recommend Protonix therapy for possible reflux symptoms Recommend cardiac cath prior to discharge for evaluation of unstable angina  Signed: Yolonda Kida MD  09/21/2020, 1:17 PM

## 2020-09-21 NOTE — Progress Notes (Signed)
[facility] Cardiology    SUBJECTIVE: Patient states he feels much better. No groin c/o s/p cath/PCI stent to circ.Ambulating well in the halls. No c/o.   Vitals:   09/20/20 2349 09/21/20 0329 09/21/20 0746 09/21/20 1201  BP: 118/74 123/73 122/72 124/72  Pulse: 80 71 73 82  Resp: 18 16 14 17   Temp: 98.1 F (36.7 C) 98.7 F (37.1 C) 98.5 F (36.9 C) 98.4 F (36.9 C)  TempSrc: Oral Oral Oral Oral  SpO2: 96% 92% 99% 98%  Weight:  100.8 kg    Height:         Intake/Output Summary (Last 24 hours) at 09/21/2020 1220 Last data filed at 09/21/2020 0300 Gross per 24 hour  Intake 950 ml  Output 200 ml  Net 750 ml      PHYSICAL EXAM  General: Well developed, well nourished, in no acute distress HEENT:  Normocephalic and atramatic Neck:  No JVD.  Lungs: Clear bilaterally to auscultation and percussion. Heart: HRRR . Normal S1 and S2 without gallops or murmurs.  Abdomen: Bowel sounds are positive, abdomen soft and non-tender  Msk:  Back normal, normal gait. Normal strength and tone for age. Extremities: No clubbing, cyanosis or edema.   Neuro: Alert and oriented X 3. Psych:  Good affect, responds appropriately   LABS: Basic Metabolic Panel: Recent Labs    09/19/20 1639 09/20/20 0518  NA 137 137  K 3.6 3.9  CL 105 104  CO2 24 24  GLUCOSE 183* 123*  BUN 14 13  CREATININE 0.97 0.99  CALCIUM 9.6 8.9   Liver Function Tests: Recent Labs    09/19/20 1310 09/19/20 1639  AST 35 36  ALT 27 26  ALKPHOS 48 37*  BILITOT 0.5 0.7  PROT 8.0 8.0  ALBUMIN 4.7 4.5   No results for input(s): LIPASE, AMYLASE in the last 72 hours. CBC: Recent Labs    09/19/20 1310 09/19/20 1639 09/19/20 1639 09/20/20 0518 09/21/20 0633  WBC 14.6* 12.5*   < > 19.6* 17.5*  NEUTROABS 8.4* 8.1*  --   --   --   HGB 15.3 14.8   < > 14.0 14.0  HCT 43.9 43.0   < > 41.3 39.9  MCV 88 88.1   < > 89.4 88.3  PLT 163 148*   < > 152 145*   < > = values in this interval not displayed.   Cardiac  Enzymes: No results for input(s): CKTOTAL, CKMB, CKMBINDEX, TROPONINI in the last 72 hours. BNP: Invalid input(s): POCBNP D-Dimer: No results for input(s): DDIMER in the last 72 hours. Hemoglobin A1C: Recent Labs    09/21/20 0633  HGBA1C 6.1*   Fasting Lipid Panel: Recent Labs    09/20/20 0518  CHOL 144  HDL 36*  LDLCALC 90  TRIG 92  CHOLHDL 4.0   Thyroid Function Tests: Recent Labs    09/19/20 2326  TSH 4.456   Anemia Panel: No results for input(s): VITAMINB12, FOLATE, FERRITIN, TIBC, IRON, RETICCTPCT in the last 72 hours.  DG Chest Port 1 View  Result Date: 09/19/2020 CLINICAL DATA:  Chest pain EXAM: PORTABLE CHEST 1 VIEW COMPARISON:  11/11/2006 FINDINGS: Lungs are clear.  No pleural effusion or pneumothorax. Heart is normal in size. IMPRESSION: No evidence of acute cardiopulmonary disease. Electronically Signed   By: Julian Hy M.D.   On: 09/19/2020 16:35      Echo pending  TELEMETRY: NSR 65 nsstw :  ASSESSMENT AND PLAN:  Principal Problem:   NSTEMI (non-ST elevated  myocardial infarction) [facility]) Active Problems:   Coronary atherosclerosis of native coronary artery   Acid reflux   Pre-diabetes   Benign essential hypertension   Leukocytosis    Plan  S/P LHC PCI stent to mid Circ with DES Continue Brilinta/ASA/Statin/B-blocker add ARB Agree with protonix for GERD Refer to Cardiac rehab HTN control with metoprolol and Losartan 25 mg daily Recommend daily exersice 45 min-60 mins F/U with cardiology 1-2 weeks with [facility]  Meds on discharge Aspirin 81 mg daily Brilinta 90 mg bid  Lipitor 80 mg daily Metoprolol succinate 50 mg daily Start losartan 25 mg daily  Yolonda Kida, MD 09/21/2020 12:20 PM

## 2020-09-21 NOTE — Progress Notes (Signed)
Discharge instructions explained/pt verbalized understanding. IV and tele removed. Pt declined wheelchair/walked off unit with wife.

## 2020-09-21 NOTE — Discharge Summary (Signed)
Physician Discharge Summary  Derrick Stone CBJ:[id number] DOB: [birth year] DOA: 09/19/2020  PCP: Birdie Sons, MD  Admit date: 09/19/2020 Discharge date: 09/21/2020  Admitted From: Home Disposition: Home  Recommendations for Outpatient Follow-up:  1. Follow up with PCP in 1-2 weeks 2. Follow-up with cardiology 3. Follow-up with cardiac rehab 4. Please obtain BMP/CBC in one week 5. Please follow up on the following pending results: Echocardiogram  Home Health: No Equipment/Devices: None Discharge Condition: Stable CODE STATUS: Full Diet recommendation: Heart Healthy   Brief/Interim Summary: 70 year old man PMH CAD presented with several day history of chest pain.  Admitted for NSTEMI.  Seen by cardiology and taken for cardiac catheterization on 09/20/2020.  Found to have 95% stenosis of mid circumflex, which was treated with 1 drug eluded stent. Patient was placed on aspirin and Brilinta. Remained chest pain-free and able to ambulate without any difficulty. Losartan was also added. His home dose of Lipitor was increased to 80 mg daily.  Patient was also found to have leukocytosis which appears chronic and will need outpatient evaluation as determined by his PCP.  Patient was also found to have mild hyperglycemia.  A1c was checked and it was 6.1 which makes him prediabetic.  Patient will follow up with his PCP for further recommendations.  Patient will continue with rest of his home medications and follow-up with his providers.  Discharge Diagnoses:  Principal Problem:   NSTEMI (non-ST elevated myocardial infarction) [facility]) Active Problems:   Coronary atherosclerosis of native coronary artery   Acid reflux   Pre-diabetes   Benign essential hypertension   Leukocytosis   Discharge Instructions  Discharge Instructions    AMB Referral to Cardiac Rehabilitation - Phase II   Complete by: As directed    Diagnosis: Coronary Stents   After initial evaluation and assessments  completed: Virtual Based Care may be provided alone or in conjunction with Phase 2 Cardiac Rehab based on patient barriers.: Yes   Diet - low sodium heart healthy   Complete by: As directed    Discharge instructions   Complete by: As directed    It was pleasure taking care of you. Continue taking your medications as there were some changes and new medications added by your cardiologist. Follow-up with your cardiologist in 1 to 2 weeks.   Increase activity slowly   Complete by: As directed      Allergies as of 09/21/2020      Reactions   Gold-containing Drug Products    Nitroglycerin Other (See Comments)   Passed out when given to him once      Medication List    TAKE these medications   aspirin EC 81 MG tablet Take by mouth.   atorvastatin 80 MG tablet Commonly known as: LIPITOR Take 1 tablet (80 mg total) by mouth daily. Start taking on: September 22, 2020 What changed:   medication strength  See the new instructions.   escitalopram 10 MG tablet Commonly known as: LEXAPRO TAKE 1 TABLET (10 MG TOTAL) BY MOUTH ONCE DAILY.   FISH OIL PO Take 1 capsule by mouth daily.   losartan 25 MG tablet Commonly known as: COZAAR Take 1 tablet (25 mg total) by mouth daily.   metoprolol succinate 50 MG 24 hr tablet Commonly known as: TOPROL-XL Take 1 tablet by mouth daily.   mometasone 50 MCG/ACT nasal spray Commonly known as: NASONEX Place 2 sprays into the nose daily. As needed   omeprazole 20 MG tablet Commonly known as: PRILOSEC OTC Take  1 tablet by mouth daily.   sucralfate 1 g tablet Commonly known as: Carafate Take 1 tablet (1 g total) by mouth 4 (four) times daily -  before meals and at bedtime.   ticagrelor 90 MG Tabs tablet Commonly known as: BRILINTA Take 1 tablet (90 mg total) by mouth 2 (two) times daily.       Follow-up Information    Callwood, Dwayne D, MD Follow up in 1 week(s).   Specialties: Cardiology, Internal Medicine Contact information: [place] [address] [phone]        Birdie Sons, MD. Schedule an appointment as soon as possible for a visit.   Specialty: Family Medicine Contact information: [address] [place] [address] [phone]              Allergies  Allergen Reactions  . Gold-Containing Drug Products   . Nitroglycerin Other (See Comments)    Passed out when given to him once    Consultations:  Cardiology  Procedures/Studies: CARDIAC CATHETERIZATION  Result Date: 09/21/2020  Mid Cx lesion is 95% stenosed.  Prox LAD to Mid LAD lesion is 10% stenosed.  Prox RCA to Mid RCA lesion is 100% stenosed.  1st Diag lesion is 50% stenosed.  Dist LAD lesion is 25% stenosed.  A drug-eluting stent was successfully placed using a STENT RESOLUTE ONYX 3.5X26.  Post intervention, there is a 0% residual stenosis.  Plan  DG Chest Port 1 View  Result Date: 09/19/2020 CLINICAL DATA:  Chest pain EXAM: PORTABLE CHEST 1 VIEW COMPARISON:  11/11/2006 FINDINGS: Lungs are clear.  No pleural effusion or pneumothorax. Heart is normal in size. IMPRESSION: No evidence of acute cardiopulmonary disease. Electronically Signed   By: Julian Hy M.D.   On: 09/19/2020 16:35     Subjective: Patient was seen and examined today.  Denies any more chest pain.  Able to walk in the hallway without any difficulty.  Wife at bedside.  Discharge Exam: Vitals:   09/21/20 0746 09/21/20 1201  BP: 122/72 124/72  Pulse: 73 82  Resp: 14 17  Temp: 98.5 F (36.9 C) 98.4 F (36.9 C)  SpO2: 99% 98%   Vitals:   09/20/20 2349 09/21/20 0329 09/21/20 0746 09/21/20 1201  BP: 118/74 123/73 122/72 124/72  Pulse: 80 71 73 82  Resp: 18 16 14 17   Temp: 98.1 F (36.7 C) 98.7 F (37.1 C) 98.5 F (36.9 C) 98.4 F (36.9 C)  TempSrc: Oral Oral Oral Oral  SpO2: 96% 92% 99% 98%  Weight:  100.8 kg    Height:        General: Pt is alert, awake, not in acute distress Cardiovascular: RRR,  S1/S2 +, no rubs, no gallops Respiratory: CTA bilaterally, no wheezing, no rhonchi Abdominal: Soft, NT, ND, bowel sounds + Extremities: no edema, no cyanosis   The results of significant diagnostics from this hospitalization (including imaging, microbiology, ancillary and laboratory) are listed below for reference.    Microbiology: Recent Results (from the past 240 hour(s))  Resp Panel by RT-PCR (Flu A&B, Covid) Nasopharyngeal Swab     Status: None   Collection Time: 09/19/20  7:14 PM   Specimen: Nasopharyngeal Swab; Nasopharyngeal(NP) swabs in vial transport medium  Result Value Ref Range Status   SARS Coronavirus 2 by RT PCR NEGATIVE NEGATIVE Final    Comment: (NOTE) SARS-CoV-2 target nucleic acids are NOT DETECTED.  The SARS-CoV-2 RNA is generally detectable in upper respiratory specimens during the acute phase  of infection. The lowest concentration of SARS-CoV-2 viral copies this assay can detect is 138 copies/mL. A negative result does not preclude SARS-Cov-2 infection and should not be used as the sole basis for treatment or other patient management decisions. A negative result may occur with  improper specimen collection/handling, submission of specimen other than nasopharyngeal swab, presence of viral mutation(s) within the areas targeted by this assay, and inadequate number of viral copies(<138 copies/mL). A negative result must be combined with clinical observations, patient history, and epidemiological information. The expected result is Negative.  Fact Sheet for Patients:  EntrepreneurPulse.com.au  Fact Sheet for Healthcare Providers:  IncredibleEmployment.be  This test is no t yet approved or cleared by the [place] FDA and  has been authorized for detection and/or diagnosis of SARS-CoV-2 by FDA under an Emergency Use Authorization (EUA). This EUA will remain  in effect (meaning this test can be used) for the duration of  the COVID-19 declaration under Section 564(b)(1) of the Act, 21 U.S.C.section 360bbb-3(b)(1), unless the authorization is terminated  or revoked sooner.       Influenza A by PCR NEGATIVE NEGATIVE Final   Influenza B by PCR NEGATIVE NEGATIVE Final    Comment: (NOTE) The Xpert Xpress SARS-CoV-2/FLU/RSV plus assay is intended as an aid in the diagnosis of influenza from Nasopharyngeal swab specimens and should not be used as a sole basis for treatment. Nasal washings and aspirates are unacceptable for Xpert Xpress SARS-CoV-2/FLU/RSV testing.  Fact Sheet for Patients: EntrepreneurPulse.com.au  Fact Sheet for Healthcare Providers: IncredibleEmployment.be  This test is not yet approved or cleared by the [place] FDA and has been authorized for detection and/or diagnosis of SARS-CoV-2 by FDA under an Emergency Use Authorization (EUA). This EUA will remain in effect (meaning this test can be used) for the duration of the COVID-19 declaration under Section 564(b)(1) of the Act, 21 U.S.C. section 360bbb-3(b)(1), unless the authorization is terminated or revoked.  Performed at [facility], [address] [place], [place] [address]      Labs: BNP (last 3 results) Recent Labs    09/19/20 1619  BNP 22.0   Basic Metabolic Panel: Recent Labs  Lab 09/19/20 1310 09/19/20 1639 09/20/20 0518  NA 140 137 137  K 4.2 3.6 3.9  CL 101 105 104  CO2 20 24 24   GLUCOSE 96 183* 123*  BUN 13 14 13   CREATININE 1.09 0.97 0.99  CALCIUM 9.7 9.6 8.9   Liver Function Tests: Recent Labs  Lab 09/19/20 1310 09/19/20 1639  AST 35 36  ALT 27 26  ALKPHOS 48 37*  BILITOT 0.5 0.7  PROT 8.0 8.0  ALBUMIN 4.7 4.5   No results for input(s): LIPASE, AMYLASE in the last 168 hours. No results for input(s): AMMONIA in the last 168 hours. CBC: Recent Labs  Lab 09/19/20 1310 09/19/20 1639 09/20/20 0518 09/21/20 0633  WBC 14.6* 12.5* 19.6*  17.5*  NEUTROABS 8.4* 8.1*  --   --   HGB 15.3 14.8 14.0 14.0  HCT 43.9 43.0 41.3 39.9  MCV 88 88.1 89.4 88.3  PLT 163 148* 152 145*   Cardiac Enzymes: No results for input(s): CKTOTAL, CKMB, CKMBINDEX, TROPONINI in the last 168 hours. BNP: Invalid input(s): POCBNP CBG: Recent Labs  Lab 09/19/20 2331 09/20/20 2300 09/21/20 0833 09/21/20 1203  GLUCAP 126* 138* 106* 97   D-Dimer No results for input(s): DDIMER in the last 72 hours. Hgb A1c Recent Labs    09/21/20 0633  HGBA1C 6.1*  Lipid Profile Recent Labs    09/20/20 0518  CHOL 144  HDL 36*  LDLCALC 90  TRIG 92  CHOLHDL 4.0   Thyroid function studies Recent Labs    09/19/20 2326  TSH 4.456   Anemia work up No results for input(s): VITAMINB12, FOLATE, FERRITIN, TIBC, IRON, RETICCTPCT in the last 72 hours. Urinalysis No results found for: COLORURINE, APPEARANCEUR, [place], [place], GLUCOSEU, [place], [place], [place], PROTEINUR, UROBILINOGEN, NITRITE, LEUKOCYTESUR Sepsis Labs Invalid input(s): PROCALCITONIN,  WBC,  LACTICIDVEN Microbiology Recent Results (from the past 240 hour(s))  Resp Panel by RT-PCR (Flu A&B, Covid) Nasopharyngeal Swab     Status: None   Collection Time: 09/19/20  7:14 PM   Specimen: Nasopharyngeal Swab; Nasopharyngeal(NP) swabs in vial transport medium  Result Value Ref Range Status   SARS Coronavirus 2 by RT PCR NEGATIVE NEGATIVE Final    Comment: (NOTE) SARS-CoV-2 target nucleic acids are NOT DETECTED.  The SARS-CoV-2 RNA is generally detectable in upper respiratory specimens during the acute phase of infection. The lowest concentration of SARS-CoV-2 viral copies this assay can detect is 138 copies/mL. A negative result does not preclude SARS-Cov-2 infection and should not be used as the sole basis for treatment or other patient management decisions. A negative result may occur with  improper specimen collection/handling, submission of specimen other than nasopharyngeal  swab, presence of viral mutation(s) within the areas targeted by this assay, and inadequate number of viral copies(<138 copies/mL). A negative result must be combined with clinical observations, patient history, and epidemiological information. The expected result is Negative.  Fact Sheet for Patients:  EntrepreneurPulse.com.au  Fact Sheet for Healthcare Providers:  IncredibleEmployment.be  This test is no t yet approved or cleared by the [place] FDA and  has been authorized for detection and/or diagnosis of SARS-CoV-2 by FDA under an Emergency Use Authorization (EUA). This EUA will remain  in effect (meaning this test can be used) for the duration of the COVID-19 declaration under Section 564(b)(1) of the Act, 21 U.S.C.section 360bbb-3(b)(1), unless the authorization is terminated  or revoked sooner.       Influenza A by PCR NEGATIVE NEGATIVE Final   Influenza B by PCR NEGATIVE NEGATIVE Final    Comment: (NOTE) The Xpert Xpress SARS-CoV-2/FLU/RSV plus assay is intended as an aid in the diagnosis of influenza from Nasopharyngeal swab specimens and should not be used as a sole basis for treatment. Nasal washings and aspirates are unacceptable for Xpert Xpress SARS-CoV-2/FLU/RSV testing.  Fact Sheet for Patients: EntrepreneurPulse.com.au  Fact Sheet for Healthcare Providers: IncredibleEmployment.be  This test is not yet approved or cleared by the [place] FDA and has been authorized for detection and/or diagnosis of SARS-CoV-2 by FDA under an Emergency Use Authorization (EUA). This EUA will remain in effect (meaning this test can be used) for the duration of the COVID-19 declaration under Section 564(b)(1) of the Act, 21 U.S.C. section 360bbb-3(b)(1), unless the authorization is terminated or revoked.  Performed at [facility], [address] [place], [place] [address]     Time  coordinating discharge: Over 30 minutes  SIGNED:  Lorella Nimrod, MD  [facility] 09/21/2020, 2:11 PM  If 7PM-7AM, please contact night-coverage [email or web address]  This record has been created using Systems analyst. Errors have been sought and corrected,but may not always be located. Such creation errors do not reflect on the standard of care.

## 2020-09-21 NOTE — Progress Notes (Signed)
*  PRELIMINARY RESULTS* Echocardiogram 2D Echocardiogram has been performed.  Derrick Stone 09/21/2020, 12:41 PM

## 2020-09-21 NOTE — Progress Notes (Signed)
Mobility Specialist - Progress Note   09/21/20 1400  Mobility  Activity Ambulated in hall  Level of Assistance Independent  Assistive Device None  Distance Ambulated (ft) 500 ft  Mobility Response Tolerated well  Mobility performed by Mobility specialist  $Mobility charge 1 Mobility    Pre-mobility: 96 HR During mobility: 96 HR, 99% SpO2 Post-mobility: 99 HR, 95% SpO2   Pt received in bed on room air. Spouse at bedside. No pain, nausea, fatigue. Pt independent with all transfers. Pt ambulated 500' in hallway without AD. No LOB noted. No c/o SOB or dizziness or weakness. O2 high 90s throughout. Pt anticipating d/c.    Kathee Delton Mobility Specialist 09/21/20, 2:35 PM

## 2020-09-22 ENCOUNTER — Telehealth: Payer: Self-pay

## 2020-09-22 NOTE — Telephone Encounter (Signed)
Patient's wife has made additional contact regarding the matter Please contact to advise further, when possible

## 2020-09-22 NOTE — Telephone Encounter (Signed)
HFU scheduled 10/05/20.

## 2020-09-22 NOTE — Telephone Encounter (Signed)
Copied from [place] [id number]. Topic: General - Call Back - No Documentation >> Sep 22, 2020 12:22 PM Erick Blinks wrote: Reason for CRM: Call back request, has questions concerning medications. He just had a stint put in on Monday, wants to know if it is safe to take advil or dramamine. Pt has a headache.   Best contact: [phone]

## 2020-09-22 NOTE — Telephone Encounter (Signed)
Transition Care Management Follow-up Telephone Call  Date of discharge and from where: [facility] on 09/21/20  How have you been since you were released from the hospital? Pt states he feels better but is still tired and weak. Pt did not rest well last night as he was awake on and off due to sleeping in a different bed. Pt did not sleep in his normal bed because it is up stairs and he's avoid going upstairs due to the stent placement. Appetite is ok. Pt states he feels almost back to normal. Pt declines acid reflux, pain, SOB, dizziness or n/v/d. Area where stent was placed looks good and shows no s/s of infection.  Any questions or concerns? No   Items Reviewed:  Did the pt receive and understand the discharge instructions provided? Yes   Medications obtained and verified? Yes   Any new allergies since your discharge? No   Dietary orders reviewed? Yes  Do you have support at home? Yes   Other (ie: DME, Home Health, etc): n/a  Functional Questionnaire: (I = Independent and D = Dependent)  Bathing/Dressing- I   Meal Prep- I  Eating- I  Maintaining continence- I  Transferring/Ambulation- I  Managing Meds- D, wife is managing medications currently.    Follow up appointments reviewed:    PCP Hospital f/u appt confirmed? Yes  scheduled to see Dr [name] on 10/05/20 @ 11:00 AM.  [facility] f/u appt confirmed? Yes    Are transportation arrangements needed? No   If their condition worsens, is the pt aware to call  their PCP or go to the ED? Yes  Was the patient provided with contact information for the PCP's office or ED? Yes  Was the pt encouraged to call back with questions or concerns? Yes

## 2020-09-22 NOTE — Telephone Encounter (Signed)
Please advise 

## 2020-09-22 NOTE — Telephone Encounter (Signed)
Ok to take dramamine. Would avoid ibuprofen/advil. Tylenol ok

## 2020-09-23 NOTE — Telephone Encounter (Signed)
Mrs. Tierno advised as below.

## 2020-09-26 DIAGNOSIS — E7800 Pure hypercholesterolemia, unspecified: Secondary | ICD-10-CM | POA: Diagnosis not present

## 2020-09-26 DIAGNOSIS — I252 Old myocardial infarction: Secondary | ICD-10-CM | POA: Diagnosis not present

## 2020-09-26 DIAGNOSIS — K219 Gastro-esophageal reflux disease without esophagitis: Secondary | ICD-10-CM | POA: Diagnosis not present

## 2020-09-26 DIAGNOSIS — I2510 Atherosclerotic heart disease of native coronary artery without angina pectoris: Secondary | ICD-10-CM | POA: Diagnosis not present

## 2020-09-26 DIAGNOSIS — I10 Essential (primary) hypertension: Secondary | ICD-10-CM | POA: Diagnosis not present

## 2020-09-26 DIAGNOSIS — E782 Mixed hyperlipidemia: Secondary | ICD-10-CM | POA: Diagnosis not present

## 2020-09-28 ENCOUNTER — Ambulatory Visit: Payer: Medicare Other

## 2020-10-05 ENCOUNTER — Ambulatory Visit: Payer: Self-pay | Admitting: Family Medicine

## 2020-10-05 ENCOUNTER — Ambulatory Visit (INDEPENDENT_AMBULATORY_CARE_PROVIDER_SITE_OTHER): Payer: Medicare Other | Admitting: Family Medicine

## 2020-10-05 ENCOUNTER — Other Ambulatory Visit: Payer: Self-pay

## 2020-10-05 DIAGNOSIS — I2510 Atherosclerotic heart disease of native coronary artery without angina pectoris: Secondary | ICD-10-CM | POA: Diagnosis not present

## 2020-10-05 DIAGNOSIS — I200 Unstable angina: Secondary | ICD-10-CM | POA: Diagnosis not present

## 2020-10-05 NOTE — Progress Notes (Signed)
Established patient visit   Patient: ANNE SEBRING   DOB: 07/22/1950   70 y.o. Male  MRN: [id number] Visit Date: 10/05/2020  Today's healthcare provider: Lelon Huh, MD   No chief complaint on file.  Subjective    HPI  Follow up Hospitalization  Patient was admitted to [facility] on 09/19/2020 and discharged on 09/21/2020. He was treated for NSTEMI. Treatment for this included cardiac catheterization, aspirin, Brilinta, Losartan, and Lipitor was increased to 80 mg. Telephone follow up was done on 09/22/2020 He reports good compliance with treatment. - not taking Brilinta - too expensive and caused sob He reports this condition is improved.  -----------------------------------------------------------------------------------------        Medications: Outpatient Medications Prior to Visit  Medication Sig  . aspirin EC 81 MG tablet Take 81 mg by mouth daily.  Marland Kitchen atorvastatin (LIPITOR) 80 MG tablet Take 1 tablet (80 mg total) by mouth daily.  Marland Kitchen escitalopram (LEXAPRO) 10 MG tablet TAKE 1 TABLET (10 MG TOTAL) BY MOUTH ONCE DAILY.  Marland Kitchen losartan (COZAAR) 25 MG tablet Take 1 tablet (25 mg total) by mouth daily.  . metoprolol succinate (TOPROL-XL) 50 MG 24 hr tablet Take 1 tablet by mouth daily.  . mometasone (NASONEX) 50 MCG/ACT nasal spray Place 2 sprays into the nose daily. As needed  . Omega-3 Fatty Acids (FISH OIL PO) Take 1 capsule by mouth daily.  Marland Kitchen omeprazole (PRILOSEC OTC) 20 MG tablet Take 1 tablet by mouth daily.  . sucralfate (CARAFATE) 1 g tablet Take 1 tablet (1 g total) by mouth 4 (four) times daily -  before meals and at bedtime.  . ticagrelor (BRILINTA) 90 MG TABS tablet Take 1 tablet (90 mg total) by mouth 2 (two) times daily.   No facility-administered medications prior to visit.    Review of Systems  Constitutional: Negative for appetite change, chills and fever.  Respiratory: Negative for chest tightness, shortness of breath and wheezing.   Cardiovascular:  Negative for chest pain and palpitations.  Gastrointestinal: Negative for abdominal pain, nausea and vomiting.       Objective    BP 137/83 (BP Location: Right Arm, Patient Position: Sitting, Cuff Size: Large)   Pulse 77   Ht 6\' 1"  (1.854 m)   Wt 227 lb 9.6 oz (103.2 kg)   BMI 30.03 kg/m     Physical Exam    General: Appearance:    Overweight male in no acute distress  Eyes:    PERRL, conjunctiva/corneas clear, EOM's intact       Lungs:     Clear to auscultation bilaterally, respirations unlabored  Heart:    Normal heart rate. Normal rhythm. No murmurs, rubs, or gallops.   MS:   All extremities are intact.   Neurologic:   Awake, alert, oriented x 3. No apparent focal neurological           defect.        Assessment & Plan     1. Atherosclerosis of native coronary artery of native heart without angina pectoris Doing well 2 weeks post PCI.  - Comprehensive metabolic panel - CBC with Differential/Platelet  Continue routine follow up cardiology.         The entirety of the information documented in the History of Present Illness, Review of Systems and Physical Exam were personally obtained by me. Portions of this information were initially documented by the CMA and reviewed by me for thoroughness and accuracy.      Lelon Huh,  MD  [facility] [phone] (phone) [phone] (fax)  [place]

## 2020-10-06 LAB — CBC WITH DIFFERENTIAL/PLATELET
Basophils Absolute: 0.1 10*3/uL (ref 0.0–0.2)
Basos: 1 %
EOS (ABSOLUTE): 0.3 10*3/uL (ref 0.0–0.4)
Eos: 3 %
Hematocrit: 39.8 % (ref 37.5–51.0)
Hemoglobin: 13.8 g/dL (ref 13.0–17.7)
Immature Grans (Abs): 0 10*3/uL (ref 0.0–0.1)
Immature Granulocytes: 0 %
Lymphocytes Absolute: 3.6 10*3/uL — ABNORMAL HIGH (ref 0.7–3.1)
Lymphs: 35 %
MCH: 30.4 pg (ref 26.6–33.0)
MCHC: 34.7 g/dL (ref 31.5–35.7)
MCV: 88 fL (ref 79–97)
Monocytes Absolute: 0.9 10*3/uL (ref 0.1–0.9)
Monocytes: 9 %
Neutrophils Absolute: 5.2 10*3/uL (ref 1.4–7.0)
Neutrophils: 52 %
Platelets: 205 10*3/uL (ref 150–450)
RBC: 4.54 x10E6/uL (ref 4.14–5.80)
RDW: 12.3 % (ref 11.6–15.4)
WBC: 10.1 10*3/uL (ref 3.4–10.8)

## 2020-10-06 LAB — COMPREHENSIVE METABOLIC PANEL
ALT: 33 IU/L (ref 0–44)
AST: 29 IU/L (ref 0–40)
Albumin/Globulin Ratio: 1.5 (ref 1.2–2.2)
Albumin: 4.5 g/dL (ref 3.8–4.8)
Alkaline Phosphatase: 51 IU/L (ref 44–121)
BUN/Creatinine Ratio: 11 (ref 10–24)
BUN: 11 mg/dL (ref 8–27)
Bilirubin Total: 0.4 mg/dL (ref 0.0–1.2)
CO2: 20 mmol/L (ref 20–29)
Calcium: 9.4 mg/dL (ref 8.6–10.2)
Chloride: 104 mmol/L (ref 96–106)
Creatinine, Ser: 0.98 mg/dL (ref 0.76–1.27)
Globulin, Total: 3 g/dL (ref 1.5–4.5)
Glucose: 84 mg/dL (ref 65–99)
Potassium: 4.1 mmol/L (ref 3.5–5.2)
Sodium: 141 mmol/L (ref 134–144)
Total Protein: 7.5 g/dL (ref 6.0–8.5)
eGFR: 85 mL/min/{1.73_m2} (ref >59)

## 2020-10-24 ENCOUNTER — Ambulatory Visit (INDEPENDENT_AMBULATORY_CARE_PROVIDER_SITE_OTHER): Payer: Medicare Other | Admitting: Family Medicine

## 2020-10-24 ENCOUNTER — Other Ambulatory Visit: Payer: Self-pay

## 2020-10-24 VITALS — BP 123/74 | HR 71 | Ht 72.0 in | Wt 226.2 lb

## 2020-10-24 DIAGNOSIS — Z23 Encounter for immunization: Secondary | ICD-10-CM | POA: Diagnosis not present

## 2020-10-24 DIAGNOSIS — I10 Essential (primary) hypertension: Secondary | ICD-10-CM

## 2020-10-24 DIAGNOSIS — R7303 Prediabetes: Secondary | ICD-10-CM | POA: Diagnosis not present

## 2020-10-24 DIAGNOSIS — Z125 Encounter for screening for malignant neoplasm of prostate: Secondary | ICD-10-CM | POA: Diagnosis not present

## 2020-10-24 DIAGNOSIS — I2510 Atherosclerotic heart disease of native coronary artery without angina pectoris: Secondary | ICD-10-CM | POA: Diagnosis not present

## 2020-10-24 MED ORDER — TETANUS-DIPHTH-ACELL PERTUSSIS 5-2.5-18.5 LF-MCG/0.5 IM SUSY: 0.5000 mL | PREFILLED_SYRINGE | Freq: Once | INTRAMUSCULAR | 0 refills | Status: AC

## 2020-10-24 NOTE — Progress Notes (Signed)
Complete physical exam   Patient: Derrick Stone   DOB: 07-Mar-1951   70 y.o. Male  MRN: [id number] Visit Date: 10/24/2020  Today's healthcare provider: Lelon Huh, MD   Chief Complaint  Patient presents with  . Annual Exam   Subjective    Derrick Stone is a 70 y.o. male who presents today for a complete physical exam.  He reports consuming a general diet-patient cut back on soft drinks and does not eat fried foods anymore. Home exercise routine includes walking 1 hrs per day. He generally feels well. He reports sleeping well. He does have additional problems to discuss today.  HPI  Patient has nodules on palm of right hand and says they are not painful but would like them looked at today.  Past Medical History:  Diagnosis Date  . CAD (coronary artery disease)   . History of chickenpox   . History of mumps   . Hyperlipidemia   . Hypertension    Past Surgical History:  Procedure Laterality Date  . CORONARY STENT INTERVENTION N/A 09/20/2020   Procedure: CORONARY STENT INTERVENTION;  Surgeon: Yolonda Kida, MD;  Location: [facility];  Service: Cardiovascular;  Laterality: N/A;  . Heart artery stent  2008   x2  . HERNIA REPAIR  2016  . LEFT HEART CATH AND CORONARY ANGIOGRAPHY N/A 09/20/2020   Procedure: LEFT HEART CATH AND CORONARY ANGIOGRAPHY and possible PCI and stent;  Surgeon: Yolonda Kida, MD;  Location: [facility];  Service: Cardiovascular;  Laterality: N/A;   Social History   Socioeconomic History  . Marital status: Married    Spouse name: Not on file  . Number of children: 2  . Years of education: Not on file  . 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 Smoking status: Never Smoker  . Smokeless tobacco: Never Used  Vaping Use  . Vaping Use: Never used  Substance and Sexual Activity  . Alcohol use: No  . Drug  use: No  . Sexual activity: Not on file  Other Topics Concern  . Not on file  Social History Narrative  . Not on file   Social Determinants of Health   Financial Resource Strain: Not on file  Food Insecurity: Not on file  Transportation Needs: Not on file  Physical Activity: Not on file  Stress: Not on file  Social Connections: Not on file  Intimate Partner Violence: Not on file   Family Status  Relation Name Status  . Mother  Alive  . Father  Alive       pre-diabetic  . Brother  Alive  . PGF  (Not Specified)   Family History  Problem Relation Age of Onset  . Melanoma Mother   . Diabetes Father   . Bone cancer Father   . Testicular cancer Brother   . Heart disease Paternal Grandfather    Allergies  Allergen Reactions  . Gold-Containing Drug Products   . Nitroglycerin Other (See Comments)    Passed out when given to him once    Patient Care Team: Birdie Sons, MD as PCP - General (Family Medicine) Ubaldo Glassing Javier Docker, MD as Consulting Physician (Cardiology) Ralene Bathe, MD (Dermatology) Lorelee Cover., MD (Ophthalmology) Clyde Canterbury, MD as Referring Physician (Otolaryngology)   Medications: Outpatient Medications Prior to Visit  Medication Sig  .  aspirin EC 81 MG tablet Take 81 mg by mouth daily.  Marland Kitchen atorvastatin (LIPITOR) 80 MG tablet Take 1 tablet (80 mg total) by mouth daily.  . clopidogrel (PLAVIX) 75 MG tablet Take 75 mg by mouth daily.  Marland Kitchen escitalopram (LEXAPRO) 10 MG tablet TAKE 1 TABLET (10 MG TOTAL) BY MOUTH ONCE DAILY.  Marland Kitchen losartan (COZAAR) 25 MG tablet Take 1 tablet (25 mg total) by mouth daily.  . metoprolol succinate (TOPROL-XL) 50 MG 24 hr tablet Take 1 tablet by mouth daily.  . mometasone (NASONEX) 50 MCG/ACT nasal spray Place 2 sprays into the nose daily. As needed  . Omega-3 Fatty Acids (FISH OIL PO) Take 1 capsule by mouth daily.  Marland Kitchen omeprazole (PRILOSEC OTC) 20 MG tablet Take 1 tablet by mouth daily.  . sucralfate (CARAFATE) 1 g tablet  Take 1 tablet (1 g total) by mouth 4 (four) times daily -  before meals and at bedtime.   No facility-administered medications prior to visit.    Review of Systems  Constitutional: Negative.   HENT: Negative.   Eyes: Negative.   Respiratory: Negative.   Cardiovascular: Negative.   Gastrointestinal: Negative.   Endocrine: Negative.   Genitourinary: Negative.   Musculoskeletal: Negative.   Skin: Negative.   Allergic/Immunologic: Negative.   Neurological: Negative.   Hematological: Negative.   Psychiatric/Behavioral: Negative.       Objective    BP 123/74 (BP Location: Right Arm, Patient Position: Sitting, Cuff Size: Normal)   Pulse 71   Ht 6' (1.829 m)   Wt 226 lb 3.2 oz (102.6 kg)   SpO2 100%   BMI 30.68 kg/m    Physical Exam   General Appearance:    Mildly obese male. Alert, cooperative, in no acute distress, appears stated age  Head:    Normocephalic, without obvious abnormality, atraumatic  Eyes:    PERRL, conjunctiva/corneas clear, EOM's intact, fundi    benign, both eyes       Ears:    Normal TM's and external ear canals, both ears  Neck:   Supple, symmetrical, trachea midline, no adenopathy;       thyroid:  No enlargement/tenderness/nodules; no carotid   bruit or JVD  Back:     Symmetric, no curvature, ROM normal, no CVA tenderness  Lungs:     Clear to auscultation bilaterally, respirations unlabored  Chest wall:    No tenderness or deformity  Heart:    Normal heart rate. Normal rhythm. No murmurs, rubs, or gallops.  S1 and S2 normal  Abdomen:     Soft, non-tender, bowel sounds active all four quadrants,    no masses, no organomegaly  Genitalia:    deferred  Rectal:    deferred  Extremities:   All extremities are intact. No cyanosis or edema. Small Dupuytren's contractures right hand.   Pulses:   2+ and symmetric all extremities  Skin:   Skin color, texture, turgor normal, no rashes or lesions  Lymph nodes:   Cervical, supraclavicular, and axillary nodes  normal  Neurologic:   CNII-XII intact. Normal strength, sensation and reflexes      throughout     Assessment & Plan    Routine Health Maintenance and Physical Exam  Exercise Activities and Dietary recommendations Goals    . DIET - INCREASE WATER INTAKE     Recommend to drink at least 6-8 8oz glasses of water per day.       Immunization History  Administered Date(s) Administered  . Fluad Quad(high Dose 65+)  08/31/2019  . Influenza Split 04/04/2009  . Influenza, High Dose Seasonal PF 08/08/2018  . Influenza,inj,Quad PF,6+ Mos 07/23/2013, 04/12/2017  . Pneumococcal Polysaccharide-23 08/29/2018  . Tdap 04/04/2009  . Zoster 07/23/2013  . Zoster Recombinat (Shingrix) 04/12/2017, 06/12/2017    Health Maintenance  Topic Date Due  . COVID-19 Vaccine (1) Never done  . TETANUS/TDAP  04/05/2019  . PNA vac Low Risk Adult (2 of 2 - PCV13) 08/30/2019  . INFLUENZA VACCINE  02/13/2021  . COLONOSCOPY (Pts 45-60yrs Insurance coverage will need to be confirmed)  10/28/2023  . Hepatitis C Screening  Completed  . HPV VACCINES  Aged Out    Discussed health benefits of physical activity, and encouraged him to engage in regular exercise appropriate for his age and condition.  1. Benign essential hypertension Very well controlled.  - Comprehensive metabolic panel  2. Atherosclerosis of native coronary artery of native heart without angina pectoris Doing well post PCI. Walking 27miles every day. Tolerating addition of clopidogrel and increased dose of atorvastatin. Follow up Dr. Clayborn Bigness in June as scheduled.  - Lipid panel  3. Pre-diabetes Doing well with diet and exercise.   4. Prostate cancer screening  - PSA Total (Reflex To Free) ([facility] only)  5. Need for vaccination against Streptococcus pneumoniae  - Pneumococcal conjugate vaccine 13-valent  6. Prescription for Tdap. Vaccine not administered in office.   - Tdap ([place]) 5-2.5-18.5 LF-MCG/0.5 injection; Inject 0.5 mLs  into the muscle once for 1 dose.  Dispense: 0.5 mL; Refill: 0        The entirety of the information documented in the History of Present Illness, Review of Systems and Physical Exam were personally obtained by me. Portions of this information were initially documented by the CMA and reviewed by me for thoroughness and accuracy.      Lelon Huh, MD  [facility] [phone] (phone) [phone] (fax)  [place]

## 2020-10-24 NOTE — Progress Notes (Signed)
Annual Wellness Visit     Patient: Derrick Stone, Male    DOB: [birth year], 70 y.o.   MRN: [id number] Visit Date: 10/24/2020  Today's Provider: Lelon Huh, MD   Subjective    WIL Derrick Stone is a 70 y.o. male who presents today for his Annual Wellness Visit.     Medications: Outpatient Medications Prior to Visit  Medication Sig  . aspirin EC 81 MG tablet Take 81 mg by mouth daily.  Marland Kitchen atorvastatin (LIPITOR) 80 MG tablet Take 1 tablet (80 mg total) by mouth daily.  . clopidogrel (PLAVIX) 75 MG tablet Take 75 mg by mouth daily.  Marland Kitchen escitalopram (LEXAPRO) 10 MG tablet TAKE 1 TABLET (10 MG TOTAL) BY MOUTH ONCE DAILY.  Marland Kitchen losartan (COZAAR) 25 MG tablet Take 1 tablet (25 mg total) by mouth daily.  . metoprolol succinate (TOPROL-XL) 50 MG 24 hr tablet Take 1 tablet by mouth daily.  . mometasone (NASONEX) 50 MCG/ACT nasal spray Place 2 sprays into the nose daily. As needed  . Omega-3 Fatty Acids (FISH OIL PO) Take 1 capsule by mouth daily.  Marland Kitchen omeprazole (PRILOSEC OTC) 20 MG tablet Take 1 tablet by mouth daily.  . sucralfate (CARAFATE) 1 g tablet Take 1 tablet (1 g total) by mouth 4 (four) times daily -  before meals and at bedtime.   No facility-administered medications prior to visit.    Allergies  Allergen Reactions  . Gold-Containing Drug Products   . Nitroglycerin Other (See Comments)    Passed out when given to him once    Patient Care Team: Birdie Sons, MD as PCP - General (Family Medicine) Ubaldo Glassing Javier Docker, MD as Consulting Physician (Cardiology) Ralene Bathe, MD (Dermatology) Lorelee Cover., MD (Ophthalmology) Clyde Canterbury, MD as Referring Physician (Otolaryngology)       Objective    Most recent functional status assessment: In your present state of health, do you have any difficulty performing the following activities: 10/24/2020  Hearing? N  Vision? N  Difficulty concentrating or making decisions? N  Walking or climbing stairs? N  Dressing or  bathing? N  Doing errands, shopping? N  Some recent data might be hidden   Most recent fall risk assessment: Fall Risk  10/24/2020  Falls in the past year? 0  Number falls in past yr: 0  Injury with Fall? 0    Most recent depression screenings: PHQ 2/9 Scores 10/24/2020 08/31/2019  PHQ - 2 Score 0 0  PHQ- 9 Score 0 -   Most recent cognitive screening: 6CIT Screen 08/29/2018  What Year? 0 points  What month? 0 points  What time? 0 points  Count back from 20 0 points  Months in reverse 0 points  Repeat phrase 0 points  Total Score 0   Most recent Audit-C alcohol use screening Alcohol Use Disorder Test (AUDIT) 10/24/2020  1. How often do you have a drink containing alcohol? 0  2. How many drinks containing alcohol do you have on a typical day when you are drinking? 0  3. How often do you have six or more drinks on one occasion? 0  AUDIT-C Score 0   A score of 3 or more in women, and 4 or more in men indicates increased risk for alcohol abuse, EXCEPT if all of the points are from question 1   No results found for any visits on 10/24/20.  Assessment & Plan     Annual wellness visit done today including the  all of the following: Reviewed patient's Family Medical History Reviewed and updated list of patient's medical providers Assessment of cognitive impairment was done Assessed patient's functional ability Established a written schedule for health screening [place] Completed and Reviewed  Exercise Activities and Dietary recommendations Goals    . DIET - INCREASE WATER INTAKE     Recommend to drink at least 6-8 8oz glasses of water per day.       Immunization History  Administered Date(s) Administered  . Fluad Quad(high Dose 65+) 08/31/2019  . Influenza Split 04/04/2009  . Influenza, High Dose Seasonal PF 08/08/2018  . Influenza,inj,Quad PF,6+ Mos 07/23/2013, 04/12/2017  . Pneumococcal Conjugate-13 10/24/2020  . Pneumococcal Polysaccharide-23  08/29/2018  . Tdap 04/04/2009  . Zoster 07/23/2013  . Zoster Recombinat (Shingrix) 04/12/2017, 06/12/2017    Health Maintenance  Topic Date Due  . COVID-19 Vaccine (1) Never done  . TETANUS/TDAP  04/05/2019  . INFLUENZA VACCINE  02/13/2021  . COLONOSCOPY (Pts 45-29yrs Insurance coverage will need to be confirmed)  10/28/2023  . Hepatitis C Screening  Completed  . PNA vac Low Risk Adult  Completed  . HPV VACCINES  Aged Out     Discussed health benefits of physical activity, and encouraged him to engage in regular exercise appropriate for his age and condition.           The entirety of the information documented in the History of Present Illness, Review of Systems and Physical Exam were personally obtained by me. Portions of this information were initially documented by the CMA and reviewed by me for thoroughness and accuracy.      Lelon Huh, MD  [facility] [phone] (phone) [phone] (fax)  [place]

## 2020-10-25 DIAGNOSIS — I2510 Atherosclerotic heart disease of native coronary artery without angina pectoris: Secondary | ICD-10-CM | POA: Diagnosis not present

## 2020-10-25 DIAGNOSIS — Z125 Encounter for screening for malignant neoplasm of prostate: Secondary | ICD-10-CM | POA: Diagnosis not present

## 2020-10-25 DIAGNOSIS — I10 Essential (primary) hypertension: Secondary | ICD-10-CM | POA: Diagnosis not present

## 2020-10-26 LAB — COMPREHENSIVE METABOLIC PANEL
ALT: 36 IU/L (ref 0–44)
AST: 32 IU/L (ref 0–40)
Albumin/Globulin Ratio: 1.4 (ref 1.2–2.2)
Albumin: 4.6 g/dL (ref 3.8–4.8)
Alkaline Phosphatase: 55 IU/L (ref 44–121)
BUN/Creatinine Ratio: 12 (ref 10–24)
BUN: 13 mg/dL (ref 8–27)
Bilirubin Total: 0.4 mg/dL (ref 0.0–1.2)
CO2: 21 mmol/L (ref 20–29)
Calcium: 9.8 mg/dL (ref 8.6–10.2)
Chloride: 104 mmol/L (ref 96–106)
Creatinine, Ser: 1.07 mg/dL (ref 0.76–1.27)
Globulin, Total: 3.2 g/dL (ref 1.5–4.5)
Glucose: 113 mg/dL — ABNORMAL HIGH (ref 65–99)
Potassium: 4 mmol/L (ref 3.5–5.2)
Sodium: 144 mmol/L (ref 134–144)
Total Protein: 7.8 g/dL (ref 6.0–8.5)
eGFR: 76 mL/min/{1.73_m2} (ref >59)

## 2020-10-26 LAB — LIPID PANEL
Chol/HDL Ratio: 3.9 ratio (ref 0.0–5.0)
Cholesterol, Total: 128 mg/dL (ref 100–199)
HDL: 33 mg/dL — ABNORMAL LOW (ref >39)
LDL Chol Calc (NIH): 72 mg/dL (ref 0–99)
Triglycerides: 127 mg/dL (ref 0–149)
VLDL Cholesterol Cal: 23 mg/dL (ref 5–40)

## 2020-10-26 LAB — PSA TOTAL (REFLEX TO FREE): Prostate Specific Ag, Serum: 0.7 ng/mL (ref 0.0–4.0)

## 2021-01-06 DIAGNOSIS — K219 Gastro-esophageal reflux disease without esophagitis: Secondary | ICD-10-CM | POA: Diagnosis not present

## 2021-01-06 DIAGNOSIS — Z955 Presence of coronary angioplasty implant and graft: Secondary | ICD-10-CM | POA: Diagnosis not present

## 2021-01-06 DIAGNOSIS — I2510 Atherosclerotic heart disease of native coronary artery without angina pectoris: Secondary | ICD-10-CM | POA: Diagnosis not present

## 2021-01-06 DIAGNOSIS — E782 Mixed hyperlipidemia: Secondary | ICD-10-CM | POA: Diagnosis not present

## 2021-01-06 DIAGNOSIS — I10 Essential (primary) hypertension: Secondary | ICD-10-CM | POA: Diagnosis not present

## 2021-01-06 DIAGNOSIS — I214 Non-ST elevation (NSTEMI) myocardial infarction: Secondary | ICD-10-CM | POA: Diagnosis not present

## 2021-01-06 DIAGNOSIS — R7303 Prediabetes: Secondary | ICD-10-CM | POA: Diagnosis not present

## 2021-01-22 ENCOUNTER — Other Ambulatory Visit: Payer: Self-pay | Admitting: Family Medicine

## 2021-01-22 DIAGNOSIS — K219 Gastro-esophageal reflux disease without esophagitis: Secondary | ICD-10-CM

## 2021-01-22 NOTE — Telephone Encounter (Signed)
Requested Prescriptions  Pending Prescriptions Disp Refills  . sucralfate (CARAFATE) 1 g tablet [Pharmacy Med Name: SUCRALFATE 1 GM TABLET] 120 tablet 3    Sig: TAKE 1 TABLET (1 G TOTAL) BY MOUTH 4 (FOUR) TIMES DAILY - BEFORE MEALS AND AT BEDTIME.     Gastroenterology: Antiacids Passed - 01/22/2021  9:10 AM      Passed - Valid encounter within last 12 months    Recent Outpatient Visits          3 months ago Prescription for Tdap. Vaccine not administered in office.    [place], MD   3 months ago Atherosclerosis of native coronary artery of native heart without angina pectoris   [facility] Birdie Sons, MD   4 months ago Chest tightness   [facility] Jerrol Banana., MD   9 months ago [facility] [place] [facility], Wendee Beavers, [place]   1 year ago Need for influenza vaccination   [facility] Birdie Sons, MD

## 2021-02-09 ENCOUNTER — Ambulatory Visit: Payer: Medicare Other | Admitting: Dermatology

## 2021-03-18 IMAGING — DX DG CHEST 1V PORT
1 series · 1 of 1 positions shown · non-contrast
Comparison: 11/11/2006

CLINICAL DATA: Chest pain

EXAM:
PORTABLE CHEST 1 VIEW

[chest ap]
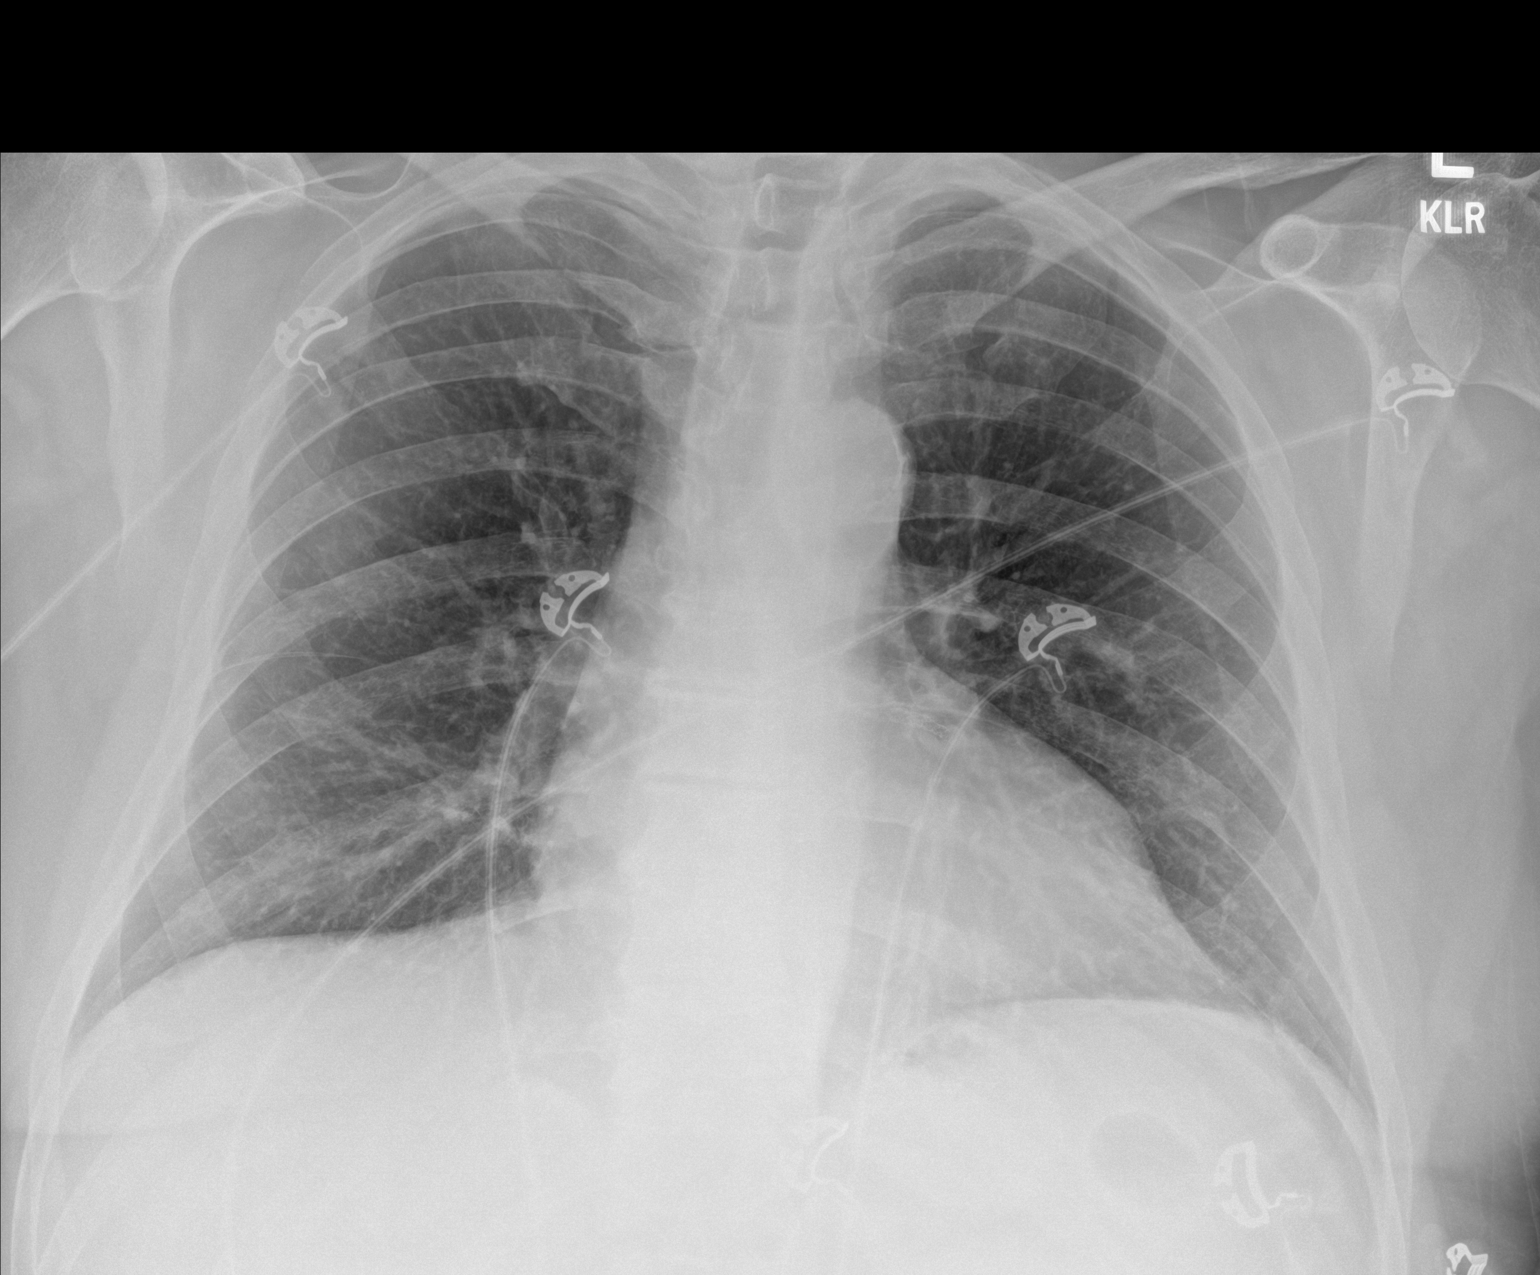

[1 of 1 positions shown; findings below may reference images not displayed]

FINDINGS: Lungs are clear.  No pleural effusion or pneumothorax.

Heart is normal in size.
IMPRESSION: No evidence of acute cardiopulmonary disease.

## 2021-05-16 ENCOUNTER — Ambulatory Visit (INDEPENDENT_AMBULATORY_CARE_PROVIDER_SITE_OTHER): Payer: Medicare Other | Admitting: Dermatology

## 2021-05-16 ENCOUNTER — Encounter: Payer: Self-pay | Admitting: Dermatology

## 2021-05-16 ENCOUNTER — Other Ambulatory Visit: Payer: Self-pay

## 2021-05-16 DIAGNOSIS — L918 Other hypertrophic disorders of the skin: Secondary | ICD-10-CM | POA: Diagnosis not present

## 2021-05-16 DIAGNOSIS — L570 Actinic keratosis: Secondary | ICD-10-CM

## 2021-05-16 DIAGNOSIS — I200 Unstable angina: Secondary | ICD-10-CM

## 2021-05-16 DIAGNOSIS — L719 Rosacea, unspecified: Secondary | ICD-10-CM | POA: Diagnosis not present

## 2021-05-16 DIAGNOSIS — D1800 Hemangioma unspecified site: Secondary | ICD-10-CM | POA: Diagnosis not present

## 2021-05-16 DIAGNOSIS — Z1283 Encounter for screening for malignant neoplasm of skin: Secondary | ICD-10-CM | POA: Diagnosis not present

## 2021-05-16 DIAGNOSIS — L578 Other skin changes due to chronic exposure to nonionizing radiation: Secondary | ICD-10-CM

## 2021-05-16 DIAGNOSIS — L821 Other seborrheic keratosis: Secondary | ICD-10-CM

## 2021-05-16 DIAGNOSIS — L814 Other melanin hyperpigmentation: Secondary | ICD-10-CM | POA: Diagnosis not present

## 2021-05-16 DIAGNOSIS — L820 Inflamed seborrheic keratosis: Secondary | ICD-10-CM

## 2021-05-16 DIAGNOSIS — D229 Melanocytic nevi, unspecified: Secondary | ICD-10-CM

## 2021-05-16 NOTE — Patient Instructions (Addendum)
Instructions for [facility] Medications  One or more of your medications was sent to the [facility] mail order compounding pharmacy. You will receive an email from them and can purchase the medicine through that link. It will then be mailed to your home at the address you confirmed. If for any reason you do not receive an email from them, please check your spam folder. If you still do not find the email, please let us know. [facility] phone number is [phone].       Cryotherapy Aftercare  Wash gently with soap and water everyday.   Apply Vaseline and Band-Aid daily until healed.    If you have any questions or concerns for your doctor, please call our main line at [phone] and press option 4 to reach your doctor's medical assistant. If no one answers, please leave a voicemail as directed and we will return your call as soon as possible. Messages left after 4 pm will be answered the following business day.   You may also send [place] a message via [place]. We typically respond to MyChart messages within 1-2 business days.  For prescription refills, please ask your pharmacy to contact our office. Our fax number is [phone].  If you have an urgent issue when the clinic is closed that cannot wait until the next business day, you can page your doctor at the number below.    Please note that while we do our best to be available for urgent issues outside of office hours, we are not available 24/7.   If you have an urgent issue and are unable to reach [place], you may choose to seek medical care at your doctor's office, retail clinic, urgent care center, or emergency room.  If you have a medical emergency, please immediately call 911 or go to the emergency department.  Pager Numbers  - Dr. Nehemiah Massed: [phone]  - Dr. Laurence Ferrari: [phone]  - Dr. Nicole Kindred: [phone]  In the event of inclement weather, please call our main line at [phone] for an update on the status  of any delays or closures.  Dermatology Medication Tips: Please keep the boxes that topical medications come in in order to help keep track of the instructions about where and how to use these. Pharmacies typically print the medication instructions only on the boxes and not directly on the medication tubes.   If your medication is too expensive, please contact our office at [phone] option 4 or send [place] a message through [place].   We are unable to tell what your co-pay for medications will be in advance as this is different depending on your insurance coverage. However, we may be able to find a substitute medication at lower cost or fill out paperwork to get insurance to cover a needed medication.   If a prior authorization is required to get your medication covered by your insurance company, please allow [place] 1-2 business days to complete this process.  Drug prices often vary depending on where the prescription is filled and some pharmacies may offer cheaper prices.  The website www.goodrx.com contains coupons for medications through different pharmacies. The prices here do not account for what the cost may be with help from insurance (it may be cheaper with your insurance), but the website can give you the price if you did not use any insurance.  - You can print the associated coupon and take it with your prescription to the pharmacy.  - You may also stop by our office during  regular business hours and pick up a GoodRx coupon card.  - If you need your prescription sent electronically to a different pharmacy, notify our office through [facility] or by phone at [phone] option 4.

## 2021-05-16 NOTE — Progress Notes (Signed)
Follow-Up Visit   Subjective  Derrick Stone is a 70 y.o. male who presents for the following: Annual Exam (No history of skin cancer - TBSE today) and Other (Spot on nose that is a little sensitive). The patient presents for Total-Body Skin Exam (TBSE) for skin cancer screening and mole check.  The following portions of the chart were reviewed this encounter and updated as appropriate:   Tobacco  Allergies  Meds  Problems  Med Hx  Surg Hx  Fam Hx     Review of Systems:  No other skin or systemic complaints except as noted in HPI or Assessment and Plan.  Objective  Well appearing patient in no apparent distress; mood and affect are within normal limits.  A full examination was performed including scalp, head, eyes, ears, nose, lips, neck, chest, axillae, abdomen, back, buttocks, bilateral upper extremities, bilateral lower extremities, hands, feet, fingers, toes, fingernails, and toenails. All findings within normal limits unless otherwise noted below.  Arms/hands x 7 (7) Erythematous keratotic or waxy stuck-on papule or plaque.   Scalp x 2, face/ears x 20 (22) Erythematous thin papules/macules with gritty scale.   Head - Anterior (Face) Pink patch of left distal dorsum nose   Assessment & Plan   Acrochordons (Skin Tags) - Fleshy, skin-colored pedunculated papules - Benign appearing.  - Observe. - If desired, they can be removed with an in office procedure that is not covered by insurance. - Please call the clinic if you notice any new or changing lesions.  Lentigines - Scattered tan macules - Due to sun exposure - Benign-appearing, observe - Recommend daily broad spectrum sunscreen SPF 30+ to sun-exposed areas, reapply every 2 hours as needed. - Call for any changes  Seborrheic Keratoses - Stuck-on, waxy, tan-brown papules and/or plaques  - Benign-appearing - Discussed benign etiology and prognosis. - Observe - Call for any changes  Melanocytic Nevi -  Tan-brown and/or pink-flesh-colored symmetric macules and papules - Benign appearing on exam today - Observation - Call clinic for new or changing moles - Recommend daily use of broad spectrum spf 30+ sunscreen to sun-exposed areas.   Hemangiomas - Red papules - Discussed benign nature - Observe - Call for any changes  Actinic Damage - Chronic condition, secondary to cumulative UV/sun exposure - diffuse scaly erythematous macules with underlying dyspigmentation - Recommend daily broad spectrum sunscreen SPF 30+ to sun-exposed areas, reapply every 2 hours as needed.  - Staying in the shade or wearing long sleeves, sun glasses (UVA+UVB protection) and wide brim hats (4-inch brim around the entire circumference of the hat) are also recommended for sun protection.  - Call for new or changing lesions.  Skin cancer screening performed today.  Inflamed seborrheic keratosis Arms/hands x 7  Destruction of lesion - Arms/hands x 7 Complexity: simple   Destruction method: cryotherapy   Informed consent: discussed and consent obtained   Timeout:  patient name, date of birth, surgical site, and procedure verified Lesion destroyed using liquid nitrogen: Yes   Region frozen until ice ball extended beyond lesion: Yes   Outcome: patient tolerated procedure well with no complications   Post-procedure details: wound care instructions given    AK (actinic keratosis) (22) Scalp x 2, face/ears x 20  Destruction of lesion - Scalp x 2, face/ears x 20 Complexity: simple   Destruction method: cryotherapy   Informed consent: discussed and consent obtained   Timeout:  patient name, date of birth, surgical site, and procedure verified Lesion destroyed using  liquid nitrogen: Yes   Region frozen until ice ball extended beyond lesion: Yes   Outcome: patient tolerated procedure well with no complications   Post-procedure details: wound care instructions given    Rosacea - with tender papule of distal  dorsum L nose Head - Anterior (Face)  Rosacea is a chronic progressive skin condition usually affecting the face of adults, causing redness and/or acne bumps. It is treatable but not curable. It sometimes affects the eyes (ocular rosacea) as well. It may respond to topical and/or systemic medication and can flare with stress, sun exposure, alcohol, exercise and some foods.  Daily application of broad spectrum spf 30+ sunscreen to face is recommended to reduce flares.  Will prescribe [facility] metronidazole/ivermectin/azelaic acid twice daily as needed to affected areas on the face. The patient was advised this is not covered by insurance since it is made by a compounding pharmacy. They will receive an email to check out and the medication will be mailed to their home.   Skin cancer screening  Return in about 3 months (around 08/16/2021) for rosacea follow up.  I, Ashok Cordia, CMA, am acting as scribe for Sarina Ser, MD . Documentation: I have reviewed the above documentation for accuracy and completeness, and I agree with the above.  Sarina Ser, MD

## 2021-05-27 ENCOUNTER — Other Ambulatory Visit: Payer: Self-pay | Admitting: Family Medicine

## 2021-05-27 DIAGNOSIS — K219 Gastro-esophageal reflux disease without esophagitis: Secondary | ICD-10-CM

## 2021-05-27 NOTE — Telephone Encounter (Signed)
Requested Prescriptions  Pending Prescriptions Disp Refills  . sucralfate (CARAFATE) 1 g tablet [Pharmacy Med Name: SUCRALFATE 1 GM TABLET] 120 tablet 3    Sig: TAKE 1 TABLET (1 G TOTAL) BY MOUTH 4 (FOUR) TIMES DAILY - BEFORE MEALS AND AT BEDTIME.     Gastroenterology: Antiacids Passed - 05/27/2021 12:11 PM      Passed - Valid encounter within last 12 months    Recent Outpatient Visits          7 months ago Prescription for Tdap. Vaccine not administered in office.    [place], [place]   7 months ago Atherosclerosis of native coronary artery of native heart without angina pectoris   [facility] Birdie Sons, MD   8 months ago Chest tightness   [facility] Jerrol Banana., MD   1 year ago [facility] [place], [place], [place]   1 year ago Need for influenza vaccination   [facility] Birdie Sons, MD      Future Appointments            In 3 months Ralene Bathe, MD [place]

## 2021-07-03 DIAGNOSIS — E782 Mixed hyperlipidemia: Secondary | ICD-10-CM | POA: Diagnosis not present

## 2021-07-03 DIAGNOSIS — Z955 Presence of coronary angioplasty implant and graft: Secondary | ICD-10-CM | POA: Diagnosis not present

## 2021-07-03 DIAGNOSIS — I2510 Atherosclerotic heart disease of native coronary artery without angina pectoris: Secondary | ICD-10-CM | POA: Diagnosis not present

## 2021-07-03 DIAGNOSIS — I214 Non-ST elevation (NSTEMI) myocardial infarction: Secondary | ICD-10-CM | POA: Diagnosis not present

## 2021-07-03 DIAGNOSIS — K219 Gastro-esophageal reflux disease without esophagitis: Secondary | ICD-10-CM | POA: Diagnosis not present

## 2021-07-03 DIAGNOSIS — I10 Essential (primary) hypertension: Secondary | ICD-10-CM | POA: Diagnosis not present

## 2021-08-29 ENCOUNTER — Other Ambulatory Visit: Payer: Self-pay

## 2021-08-29 ENCOUNTER — Ambulatory Visit (INDEPENDENT_AMBULATORY_CARE_PROVIDER_SITE_OTHER): Payer: Medicare Other | Admitting: Dermatology

## 2021-08-29 DIAGNOSIS — L570 Actinic keratosis: Secondary | ICD-10-CM | POA: Diagnosis not present

## 2021-08-29 DIAGNOSIS — L578 Other skin changes due to chronic exposure to nonionizing radiation: Secondary | ICD-10-CM | POA: Diagnosis not present

## 2021-08-29 DIAGNOSIS — L821 Other seborrheic keratosis: Secondary | ICD-10-CM

## 2021-08-29 DIAGNOSIS — L719 Rosacea, unspecified: Secondary | ICD-10-CM

## 2021-08-29 NOTE — Patient Instructions (Signed)

## 2021-08-29 NOTE — Progress Notes (Signed)
° °  Follow-Up Visit   Subjective  Derrick Stone is a 71 y.o. male who presents for the following: Actinic Keratosis (Of the face and scalp - patient is here today to check for new or persistent skin lesions. ). Patient states that rosacea has cleared skin starting the [facility] mix and he no longer has a pink patch on the L distal dorsum nose.  The patient has spots, moles and lesions to be evaluated, some may be new or changing and the patient has concerns that these could be cancer.  The following portions of the chart were reviewed this encounter and updated as appropriate:   Tobacco   Allergies   Meds   Problems   Med Hx   Surg Hx   Fam Hx      Review of Systems:  No other skin or systemic complaints except as noted in HPI or Assessment and Plan.  Objective  Well appearing patient in no apparent distress; mood and affect are within normal limits.  A focused examination was performed including the face, ears, and scalp. Relevant physical exam findings are noted in the Assessment and Plan.  Scalp x 1 Erythematous thin papules/macules with gritty scale.   Face Erythema of the face.   Assessment & Plan  AK (actinic keratosis) Scalp x 1 Destruction of lesion - Scalp x 1 Complexity: simple   Destruction method: cryotherapy   Informed consent: discussed and consent obtained   Timeout:  patient name, date of birth, surgical site, and procedure verified Lesion destroyed using liquid nitrogen: Yes   Region frozen until ice ball extended beyond lesion: Yes   Outcome: patient tolerated procedure well with no complications   Post-procedure details: wound care instructions given    Rosacea improved on treatment Face Rosacea is a chronic progressive skin condition usually affecting the face of adults, causing redness and/or acne bumps. It is treatable but not curable. It sometimes affects the eyes (ocular rosacea) as well. It may respond to topical and/or systemic medication and can  flare with stress, sun exposure, alcohol, exercise and some foods.  Daily application of broad spectrum spf 30+ sunscreen to face is recommended to reduce flares.  Continue [facility] rosacea mix QD-BID PRN.   Actinic Damage - chronic, secondary to cumulative UV radiation exposure/sun exposure over time - diffuse scaly erythematous macules with underlying dyspigmentation - Recommend daily broad spectrum sunscreen SPF 30+ to sun-exposed areas, reapply every 2 hours as needed.  - Recommend staying in the shade or wearing long sleeves, sun glasses (UVA+UVB protection) and wide brim hats (4-inch brim around the entire circumference of the hat). - Call for new or changing lesions.  Seborrheic Keratoses - Stuck-on, waxy, tan-brown papules and/or plaques  - Benign-appearing - Discussed benign etiology and prognosis. - Observe - Call for any changes  Return in about 9 months (around 05/29/2022) for TBSE.  Luther Redo, CMA, am acting as scribe for Sarina Ser, MD . Documentation: I have reviewed the above documentation for accuracy and completeness, and I agree with the above.  Sarina Ser, MD

## 2021-08-30 ENCOUNTER — Encounter: Payer: Self-pay | Admitting: Dermatology

## 2021-10-17 ENCOUNTER — Telehealth: Payer: Self-pay | Admitting: *Deleted

## 2021-10-17 NOTE — Chronic Care Management (AMB) (Signed)
?  Care Management  ? ?Note ? ?10/17/2021 ?Name: Derrick Stone MRN: [id number] DOB: September 10, 1950 ? ?Derrick Stone is a 71 y.o. year old male who is a primary care patient of Fisher, Kirstie Peri, MD. I reached out to Rosebud Poles by phone today offer care coordination services.  ? ?[name] was given information about care management services today including:  ?Care management services include personalized support from designated clinical staff supervised by his physician, including individualized plan of care and coordination with other care providers ?24/7 contact phone numbers for assistance for urgent and routine care needs. ?The patient may stop care management services at any time by phone call to the office staff. ? ?Patient agreed to services and verbal consent obtained.  ? ?Follow up plan: ?Telephone appointment with care management team member scheduled for: 10/20/2021 ? ?Nicolas Banh, CCMA ?Care Guide, Embedded Care Coordination ?[place]  Care Management  ?Direct Dial: [phone] ? ? ?

## 2021-10-20 ENCOUNTER — Ambulatory Visit: Payer: Medicare Other

## 2021-10-20 DIAGNOSIS — R7303 Prediabetes: Secondary | ICD-10-CM

## 2021-10-20 DIAGNOSIS — I10 Essential (primary) hypertension: Secondary | ICD-10-CM

## 2021-10-20 NOTE — Chronic Care Management (AMB) (Signed)
?   Care Management ?  ? RN Visit Note ? ?10/20/2021 ?Name: Derrick Stone MRN: [id number] DOB: [birth year] ? ?Subjective: ?Derrick Stone is a 71 y.o. year old male who is a primary care patient of Fisher, Kirstie Peri, MD. The care management team was consulted for assistance with disease management and care coordination needs.   ? ?Engaged with patient by telephone for follow up visit in response to provider referral for case management and care coordination services.  ? ?Consent to Services:  ? Derrick Stone was given information about Care Management services including:  ?Care Management services includes personalized support from designated clinical staff supervised by his physician, including individualized plan of care and coordination with other care providers ?24/7 contact phone numbers for assistance for urgent and routine care needs. ?The patient may stop case management services at any time by phone call to the office staff. ? ?Patient agreed to services and consent obtained.  ? ?Assessment: Review of patient past medical history, allergies, medications, health status, including review of consultants reports, laboratory and other test data, was performed as part of comprehensive evaluation and provision of chronic care management services.  ? ?SDOH (Social Determinants of Health) assessments and interventions performed:  ?SDOH Interventions   ? ?Flowsheet Row Most Recent Value  ?SDOH Interventions   ?Food Insecurity Interventions Intervention Not Indicated  ?Transportation Interventions Intervention Not Indicated  ? ?  ?  ? ?Care Plan ? ?Allergies  ?Allergen Reactions  ? Gold-Containing Drug Products   ? Nitroglycerin Other (See Comments)  ?  Passed out when given to him once  ? ? ?Outpatient Encounter Medications as of 10/20/2021  ?Medication Sig Note  ? aspirin EC 81 MG tablet Take 81 mg by mouth daily.   ? atorvastatin (LIPITOR) 80 MG tablet Take 1 tablet (80 mg total) by mouth daily.   ? clopidogrel (PLAVIX) 75 MG  tablet Take 75 mg by mouth daily. 10/05/2020: Pt is not sure what the mg is  ?  ? escitalopram (LEXAPRO) 10 MG tablet TAKE 1 TABLET (10 MG TOTAL) BY MOUTH ONCE DAILY.   ? losartan (COZAAR) 25 MG tablet Take 1 tablet (25 mg total) by mouth daily.   ? metoprolol succinate (TOPROL-XL) 50 MG 24 hr tablet Take 1 tablet by mouth daily.   ? Omega-3 Fatty Acids (FISH OIL PO) Take 1 capsule by mouth daily.   ? omeprazole (PRILOSEC OTC) 20 MG tablet Take 1 tablet by mouth daily.   ? sucralfate (CARAFATE) 1 g tablet TAKE 1 TABLET (1 G TOTAL) BY MOUTH 4 (FOUR) TIMES DAILY - BEFORE MEALS AND AT BEDTIME.   ? mometasone (NASONEX) 50 MCG/ACT nasal spray Place 2 sprays into the nose daily. As needed (Patient not taking: Reported on 10/20/2021)   ? ?No facility-administered encounter medications on file as of 10/20/2021.  ? ? ?Patient Active Problem List  ? Diagnosis Date Noted  ? Leukocytosis 09/20/2020  ? NSTEMI (non-ST elevated myocardial infarction) ([place]) 09/19/2020  ? Pre-diabetes 04/13/2017  ? Stricture and stenosis of esophagus 04/10/2017  ? Benign essential hypertension 10/09/2013  ? Pure hypercholesterolemia 11/27/2006  ? Healed myocardial infarct 11/27/2006  ? Coronary atherosclerosis of native coronary artery 11/11/2006  ? Acid reflux 07/17/2003  ? ? ?PLAN ?Derrick Stone will call or contact the clinic if he requires additional assistance. The care management team will gladly assist. ? ? ?Derrick Apolinar,RN ?[place] Care Management ?[place] ?[phone] ? ? ? ? ? ? ? ? ?

## 2021-12-06 ENCOUNTER — Encounter: Payer: Self-pay | Admitting: Family Medicine

## 2021-12-06 ENCOUNTER — Ambulatory Visit (INDEPENDENT_AMBULATORY_CARE_PROVIDER_SITE_OTHER): Payer: Medicare Other | Admitting: Family Medicine

## 2021-12-06 VITALS — BP 121/82 | HR 78 | Temp 98.1°F | Resp 14 | Ht 72.01 in | Wt 220.0 lb

## 2021-12-06 DIAGNOSIS — I10 Essential (primary) hypertension: Secondary | ICD-10-CM

## 2021-12-06 DIAGNOSIS — Z125 Encounter for screening for malignant neoplasm of prostate: Secondary | ICD-10-CM

## 2021-12-06 DIAGNOSIS — Z0000 Encounter for general adult medical examination without abnormal findings: Secondary | ICD-10-CM | POA: Diagnosis not present

## 2021-12-06 DIAGNOSIS — Z23 Encounter for immunization: Secondary | ICD-10-CM

## 2021-12-06 DIAGNOSIS — I252 Old myocardial infarction: Secondary | ICD-10-CM | POA: Diagnosis not present

## 2021-12-06 DIAGNOSIS — I2510 Atherosclerotic heart disease of native coronary artery without angina pectoris: Secondary | ICD-10-CM | POA: Diagnosis not present

## 2021-12-06 DIAGNOSIS — E7800 Pure hypercholesterolemia, unspecified: Secondary | ICD-10-CM | POA: Diagnosis not present

## 2021-12-06 DIAGNOSIS — R7303 Prediabetes: Secondary | ICD-10-CM

## 2021-12-06 DIAGNOSIS — F419 Anxiety disorder, unspecified: Secondary | ICD-10-CM | POA: Diagnosis not present

## 2021-12-06 DIAGNOSIS — K219 Gastro-esophageal reflux disease without esophagitis: Secondary | ICD-10-CM

## 2021-12-06 MED ORDER — TETANUS-DIPHTH-ACELL PERTUSSIS 5-2.5-18.5 LF-MCG/0.5 IM SUSY: 0.5000 mL | PREFILLED_SYRINGE | Freq: Once | INTRAMUSCULAR | 0 refills | Status: AC

## 2021-12-06 NOTE — Patient Instructions (Signed)
Please review the attached list of medications and notify my office if there are any errors.   Please bring all of your medications to every appointment so we can make sure that our medication list is the same as yours.   It is recommended to engage in 150 minutes of moderate exercise every week.

## 2021-12-06 NOTE — Progress Notes (Signed)
I,Roshena L Chambers,acting as a scribe for Lelon Huh, MD.,have documented all relevant documentation on the behalf of Lelon Huh, MD,as directed by  Lelon Huh, MD while in the presence of Lelon Huh, MD.    Annual Wellness Visit     Patient: Derrick Stone, Male    DOB: Jun 17, 1951, 71 y.o.   MRN: [id number] Visit Date: 12/06/2021  Today's Provider: Lelon Huh, MD   Chief Complaint  Patient presents with   Medicare Wellness   Hypertension   Hyperlipidemia   Prediabetes   Subjective    Derrick Stone is a 71 y.o. male who presents today for his Annual Wellness Visit. He reports consuming a general diet.  Golfing 4 times weekly along with yard work  He generally feels fairly well. He reports sleeping fairly well. He does not have additional problems to discuss today.   HPI Hypertension, follow-up  BP Readings from Last 3 Encounters:  12/06/21 121/82  10/24/20 123/74  10/05/20 137/83   Wt Readings from Last 3 Encounters:  12/06/21 220 lb (99.8 kg)  10/24/20 226 lb 3.2 oz (102.6 kg)  10/05/20 227 lb 9.6 oz (103.2 kg)     He was last seen for hypertension 1  year  ago.  BP at that visit was 123/74. Management since that visit includes continuing same treatment.  He reports good compliance with treatment. He is not having side effects.  He is following a Regular diet. He is exercising. He does not smoke.  Use of agents associated with hypertension: NSAIDS.   Outside blood pressures are not checked. Symptoms: No chest pain No chest pressure  No palpitations No syncope  No dyspnea No orthopnea  No paroxysmal nocturnal dyspnea No lower extremity edema   Pertinent labs Lab Results  Component Value Date   NA 144 10/25/2020   K 4.0 10/25/2020   CREATININE 1.07 10/25/2020   EGFR 76 10/25/2020   GLUCOSE 113 (H) 10/25/2020   TSH 4.456 09/19/2020     The ASCVD Risk score (Arnett DK, et al., 2019) failed to calculate for the following reasons:    The patient has a prior MI or stroke diagnosis  ---------------------------------------------------------------------------------------------------   Prediabetes, Follow-up  Lab Results  Component Value Date   HGBA1C 6.1 (H) 09/21/2020   HGBA1C 6.1 (H) 09/01/2019   HGBA1C 5.9 (H) 08/29/2018   GLUCOSE 113 (H) 10/25/2020   GLUCOSE 84 10/05/2020   GLUCOSE 123 (H) 09/20/2020    Last seen for for this1  year  ago.  Management since that visit includes continuing diet and exercise. Current symptoms include none and have been stable.  Prior visit with dietician: no Current diet: well balanced Current exercise: yard work  Pertinent Labs:    Component Value Date/Time   CHOL 128 10/25/2020 0813   TRIG 127 10/25/2020 0813   CHOLHDL 3.9 10/25/2020 0813   CHOLHDL 4.0 09/20/2020 0518   CREATININE 1.07 10/25/2020 0813    Wt Readings from Last 3 Encounters:  12/06/21 220 lb (99.8 kg)  10/24/20 226 lb 3.2 oz (102.6 kg)  10/05/20 227 lb 9.6 oz (103.2 kg)    -----------------------------------------------------------------------------------------   Lipid/Cholesterol, Follow-up  Last lipid panel Other pertinent labs  Lab Results  Component Value Date   CHOL 128 10/25/2020   HDL 33 (L) 10/25/2020   LDLCALC 72 10/25/2020   TRIG 127 10/25/2020   CHOLHDL 3.9 10/25/2020   Lab Results  Component Value Date   ALT 36 10/25/2020   AST  32 10/25/2020   PLT 205 10/05/2020   TSH 4.456 09/19/2020     He was last seen for this 1  year  ago.  Management since that visit includes continue same treatment.  He reports good compliance with treatment. He is not having side effects.   Symptoms: No chest pain No chest pressure/discomfort  No dyspnea No lower extremity edema  No numbness or tingling of extremity No orthopnea  No palpitations No paroxysmal nocturnal dyspnea  No speech difficulty No syncope   Current diet: well balanced Current exercise: yard work and golf  The ASCVD  Risk score (Arnett DK, et al., 2019) failed to calculate for the following reasons:   The patient has a prior MI or stroke diagnosis  ---------------------------------------------------------------------------------------------------    Medications: Outpatient Medications Prior to Visit  Medication Sig   aspirin EC 81 MG tablet Take 81 mg by mouth daily.   atorvastatin (LIPITOR) 80 MG tablet Take 1 tablet (80 mg total) by mouth daily.   clopidogrel (PLAVIX) 75 MG tablet Take 75 mg by mouth daily.   escitalopram (LEXAPRO) 10 MG tablet TAKE 1 TABLET (10 MG TOTAL) BY MOUTH ONCE DAILY.   losartan (COZAAR) 25 MG tablet Take 1 tablet (25 mg total) by mouth daily.   metoprolol succinate (TOPROL-XL) 50 MG 24 hr tablet Take 1 tablet by mouth daily.   mometasone (NASONEX) 50 MCG/ACT nasal spray Place 2 sprays into the nose daily. As needed   Omega-3 Fatty Acids (FISH OIL PO) Take 1 capsule by mouth daily.   omeprazole (PRILOSEC OTC) 20 MG tablet Take 1 tablet by mouth daily.   sucralfate (CARAFATE) 1 g tablet TAKE 1 TABLET (1 G TOTAL) BY MOUTH 4 (FOUR) TIMES DAILY - BEFORE MEALS AND AT BEDTIME.   No facility-administered medications prior to visit.    Allergies  Allergen Reactions   Gold-Containing Drug Products    Nitroglycerin Other (See Comments)    Passed out when given to him once    Patient Care Team: Birdie Sons, MD as PCP - General (Family Medicine) Ubaldo Glassing Javier Docker, MD as Consulting Physician (Cardiology) Ralene Bathe, MD (Dermatology) Lorelee Cover., MD (Ophthalmology) Clyde Canterbury, MD as Referring Physician (Otolaryngology) Yolonda Kida, MD as Consulting Physician (Cardiology) Neldon Labella, RN as Registered Nurse  Review of Systems  Constitutional:  Negative for appetite change, chills, fatigue and fever.  HENT:  Negative for congestion, ear pain, hearing loss, nosebleeds and trouble swallowing.   Eyes:  Negative for pain and visual disturbance.   Respiratory:  Negative for cough, chest tightness and shortness of breath.   Cardiovascular:  Negative for chest pain, palpitations and leg swelling.  Gastrointestinal:  Negative for abdominal pain, blood in stool, constipation, diarrhea, nausea and vomiting.  Endocrine: Negative for polydipsia, polyphagia and polyuria.  Genitourinary:  Negative for dysuria and flank pain.  Musculoskeletal:  Negative for arthralgias, back pain, joint swelling, myalgias and neck stiffness.  Skin:  Negative for color change, rash and wound.  Neurological:  Negative for dizziness, tremors, seizures, speech difficulty, weakness, light-headedness and headaches.  Psychiatric/Behavioral:  Negative for behavioral problems, confusion, decreased concentration, dysphoric mood and sleep disturbance. The patient is not nervous/anxious.   All other systems reviewed and are negative.      Objective    Vitals: BP 121/82 (BP Location: Left Arm, Patient Position: Sitting, Cuff Size: Large)   Pulse 78   Temp 98.1 F (36.7 C) (Oral)   Resp 14   Ht 6'  0.01" (1.829 m)   Wt 220 lb (99.8 kg)   SpO2 97% Comment: room air  BMI 29.83 kg/m     Physical Exam  General Appearance:     Well developed, well nourished male. Alert, cooperative, in no acute distress, appears stated age  Head:    Normocephalic, without obvious abnormality, atraumatic  Eyes:    PERRL, conjunctiva/corneas clear, EOM's intact, fundi    benign, both eyes       Ears:    Normal TM's and external ear canals, both ears  Nose:   Nares normal, septum midline, mucosa normal, no drainage   or sinus tenderness  Throat:   Lips, mucosa, and tongue normal; teeth and gums normal  Neck:   Supple, symmetrical, trachea midline, no adenopathy;       thyroid:  No enlargement/tenderness/nodules; no carotid   bruit or JVD  Back:     Symmetric, no curvature, ROM normal, no CVA tenderness  Lungs:     Clear to auscultation bilaterally, respirations unlabored  Chest  wall:    No tenderness or deformity  Heart:    Normal heart rate. Normal rhythm. No murmurs, rubs, or gallops.  S1 and S2 normal  Abdomen:     Soft, non-tender, bowel sounds active all four quadrants,    no masses, no organomegaly  Genitalia:    deferred  Rectal:    deferred  Extremities:   All extremities are intact. No cyanosis or edema  Pulses:   2+ and symmetric all extremities  Skin:   Skin color, texture, turgor normal, no rashes or lesions  Lymph nodes:   Cervical, supraclavicular, and axillary nodes normal  Neurologic:   CNII-XII intact. Normal strength, sensation and reflexes      throughout     Most recent functional status assessment:    12/06/2021   10:06 AM  In your present state of health, do you have any difficulty performing the following activities:  Hearing? 0  Vision? 0  Difficulty concentrating or making decisions? 0  Walking or climbing stairs? 0  Dressing or bathing? 0  Doing errands, shopping? 0   Most recent fall risk assessment:    12/06/2021   10:06 AM  Fall Risk   Falls in the past year? 0  Number falls in past yr: 0  Injury with Fall? 0  Risk for fall due to : No Fall Risks  Follow up Falls evaluation completed    Most recent depression screenings:    12/06/2021   10:06 AM 10/24/2020    8:21 AM  PHQ 2/9 Scores  PHQ - 2 Score 0 0  PHQ- 9 Score 0 0   Most recent cognitive screening:    12/06/2021   10:01 AM  6CIT Screen  What Year? 0 points  What month? 0 points  What time? 0 points  Count back from 20 0 points  Months in reverse 0 points  Repeat phrase 2 points  Total Score 2 points   Most recent Audit-C alcohol use screening    12/06/2021   10:06 AM  Alcohol Use Disorder Test (AUDIT)  1. How often do you have a drink containing alcohol? 0  2. How many drinks containing alcohol do you have on a typical day when you are drinking? 0  3. How often do you have six or more drinks on one occasion? 0  AUDIT-C Score 0   A score of 3 or  more in women, and 4 or more in men  indicates increased risk for alcohol abuse, EXCEPT if all of the points are from question 1   No results found for any visits on 12/06/21.  Assessment & Plan     Annual wellness visit done today including the all of the following: Reviewed patient's Family Medical History Reviewed and updated list of patient's medical providers Assessment of cognitive impairment was done Assessed patient's functional ability Established a written schedule for health screening [place] Completed and Reviewed  Exercise Activities and Dietary recommendations  Goals      DIET - INCREASE WATER INTAKE     Recommend to drink at least 6-8 8oz glasses of water per day.        Immunization History  Administered Date(s) Administered   Fluad Quad(high Dose 65+) 08/31/2019   Influenza Split 04/04/2009   Influenza, High Dose Seasonal PF 08/08/2018   Influenza,inj,Quad PF,6+ Mos 07/23/2013, 04/12/2017   Pneumococcal Conjugate-13 10/24/2020   Pneumococcal Polysaccharide-23 08/29/2018   Tdap 04/04/2009   Zoster Recombinat (Shingrix) 04/12/2017, 06/12/2017   Zoster, Live 07/23/2013    Health Maintenance  Topic Date Due   COVID-19 Vaccine (1) Never done   TETANUS/TDAP  04/05/2019   INFLUENZA VACCINE  02/13/2022   COLONOSCOPY (Pts 45-19yr Insurance coverage will need to be confirmed)  10/28/2023   Pneumonia Vaccine 71 Years old  Completed   Hepatitis C Screening  Completed   Zoster Vaccines- Shingrix  Completed   HPV VACCINES  Aged Out     Discussed health benefits of physical activity, and encouraged him to engage in regular exercise appropriate for his age and condition.     2. Prostate cancer screening  - PSA Total (Reflex To Free) ([facility] only)  3. Prescription for Tdap. Vaccine not administered in office.   - Tdap ([place] 5-2.5-18.5 LF-MCG/0.5 injection; Inject 0.5 mLs into the muscle once for 1 dose.  Dispense: 0.5 mL;  Refill: 0  4. Pre-diabetes  - Hemoglobin A1c  5. Atherosclerosis of native coronary artery of native heart without angina pectoris Asymptomatic. Compliant with medication.  Continue aggressive risk factor modification.  Continue routine cardiology follow up with Dr. CClayborn Bigness   6. Healed myocardial infarct   7. Primary hypertension Well controlled.  Continue current medications.    8. Anxiousness Doing very well a and tolerating escitalopram well. Continue current medications.    9. Pure hypercholesterolemia He is tolerating rosuvastatin well with no adverse effects.   - CBC - Comprehensive metabolic panel - Lipid panel   10. GERD Well controlled with very rare symptoms on OTC omeprazole. No long on sulcrafate.     The entirety of the information documented in the History of Present Illness, Review of Systems and Physical Exam were personally obtained by me. Portions of this information were initially documented by the CMA and reviewed by me for thoroughness and accuracy.     DLelon Huh MD  [phone](phone) [phone](fax)  [place]

## 2021-12-07 LAB — CBC
Hematocrit: 44.6 % (ref 37.5–51.0)
Hemoglobin: 15.1 g/dL (ref 13.0–17.7)
MCH: 30.4 pg (ref 26.6–33.0)
MCHC: 33.9 g/dL (ref 31.5–35.7)
MCV: 90 fL (ref 79–97)
Platelets: 133 10*3/uL — ABNORMAL LOW (ref 150–450)
RBC: 4.96 x10E6/uL (ref 4.14–5.80)
RDW: 12.5 % (ref 11.6–15.4)
WBC: 10.9 10*3/uL — ABNORMAL HIGH (ref 3.4–10.8)

## 2021-12-07 LAB — LIPID PANEL
Chol/HDL Ratio: 3.9 ratio (ref 0.0–5.0)
Cholesterol, Total: 154 mg/dL (ref 100–199)
HDL: 40 mg/dL (ref >39)
LDL Chol Calc (NIH): 92 mg/dL (ref 0–99)
Triglycerides: 123 mg/dL (ref 0–149)
VLDL Cholesterol Cal: 22 mg/dL (ref 5–40)

## 2021-12-07 LAB — COMPREHENSIVE METABOLIC PANEL
ALT: 23 IU/L (ref 0–44)
AST: 23 IU/L (ref 0–40)
Albumin/Globulin Ratio: 1.8 (ref 1.2–2.2)
Albumin: 4.8 g/dL (ref 3.8–4.8)
Alkaline Phosphatase: 43 IU/L — ABNORMAL LOW (ref 44–121)
BUN/Creatinine Ratio: 14 (ref 10–24)
BUN: 15 mg/dL (ref 8–27)
Bilirubin Total: 0.5 mg/dL (ref 0.0–1.2)
CO2: 22 mmol/L (ref 20–29)
Calcium: 9.8 mg/dL (ref 8.6–10.2)
Chloride: 100 mmol/L (ref 96–106)
Creatinine, Ser: 1.1 mg/dL (ref 0.76–1.27)
Globulin, Total: 2.7 g/dL (ref 1.5–4.5)
Glucose: 101 mg/dL — ABNORMAL HIGH (ref 70–99)
Potassium: 4.4 mmol/L (ref 3.5–5.2)
Sodium: 139 mmol/L (ref 134–144)
Total Protein: 7.5 g/dL (ref 6.0–8.5)
eGFR: 73 mL/min/{1.73_m2} (ref >59)

## 2021-12-07 LAB — PSA TOTAL (REFLEX TO FREE): Prostate Specific Ag, Serum: 0.6 ng/mL (ref 0.0–4.0)

## 2021-12-07 LAB — HEMOGLOBIN A1C
Est. average glucose Bld gHb Est-mCnc: 128 mg/dL
Hgb A1c MFr Bld: 6.1 % — ABNORMAL HIGH (ref 4.8–5.6)

## 2021-12-12 ENCOUNTER — Encounter: Payer: Medicare Other | Admitting: Family Medicine

## 2021-12-26 DIAGNOSIS — H25813 Combined forms of age-related cataract, bilateral: Secondary | ICD-10-CM | POA: Diagnosis not present

## 2022-01-01 DIAGNOSIS — I214 Non-ST elevation (NSTEMI) myocardial infarction: Secondary | ICD-10-CM | POA: Diagnosis not present

## 2022-01-01 DIAGNOSIS — I2510 Atherosclerotic heart disease of native coronary artery without angina pectoris: Secondary | ICD-10-CM | POA: Diagnosis not present

## 2022-01-01 DIAGNOSIS — E782 Mixed hyperlipidemia: Secondary | ICD-10-CM | POA: Diagnosis not present

## 2022-01-01 DIAGNOSIS — Z955 Presence of coronary angioplasty implant and graft: Secondary | ICD-10-CM | POA: Diagnosis not present

## 2022-01-01 DIAGNOSIS — I10 Essential (primary) hypertension: Secondary | ICD-10-CM | POA: Diagnosis not present

## 2022-01-01 DIAGNOSIS — K219 Gastro-esophageal reflux disease without esophagitis: Secondary | ICD-10-CM | POA: Diagnosis not present

## 2022-03-02 ENCOUNTER — Ambulatory Visit: Payer: Self-pay

## 2022-03-02 NOTE — Chronic Care Management (AMB) (Signed)
   03/02/2022  TREMEL SETTERS 1950-11-05 [id number]  Documentation encounter created to complete case transition. The care management team will follow for care coordination as needed.   [facility] [phone]

## 2022-05-29 ENCOUNTER — Ambulatory Visit (INDEPENDENT_AMBULATORY_CARE_PROVIDER_SITE_OTHER): Payer: Medicare Other | Admitting: Dermatology

## 2022-05-29 DIAGNOSIS — L821 Other seborrheic keratosis: Secondary | ICD-10-CM | POA: Diagnosis not present

## 2022-05-29 DIAGNOSIS — I2510 Atherosclerotic heart disease of native coronary artery without angina pectoris: Secondary | ICD-10-CM | POA: Diagnosis not present

## 2022-05-29 DIAGNOSIS — Z1283 Encounter for screening for malignant neoplasm of skin: Secondary | ICD-10-CM | POA: Diagnosis not present

## 2022-05-29 DIAGNOSIS — L570 Actinic keratosis: Secondary | ICD-10-CM

## 2022-05-29 DIAGNOSIS — L719 Rosacea, unspecified: Secondary | ICD-10-CM

## 2022-05-29 DIAGNOSIS — D229 Melanocytic nevi, unspecified: Secondary | ICD-10-CM | POA: Diagnosis not present

## 2022-05-29 DIAGNOSIS — Z79899 Other long term (current) drug therapy: Secondary | ICD-10-CM

## 2022-05-29 DIAGNOSIS — Z5111 Encounter for antineoplastic chemotherapy: Secondary | ICD-10-CM

## 2022-05-29 DIAGNOSIS — L814 Other melanin hyperpigmentation: Secondary | ICD-10-CM | POA: Diagnosis not present

## 2022-05-29 DIAGNOSIS — L578 Other skin changes due to chronic exposure to nonionizing radiation: Secondary | ICD-10-CM

## 2022-05-29 MED ORDER — FLUOROURACIL 5 % EX CREA: TOPICAL_CREAM | CUTANEOUS | 0 refills | Status: DC

## 2022-05-29 NOTE — Progress Notes (Signed)
Follow-Up Visit   Subjective  Derrick Stone is a 71 y.o. male who presents for the following: Annual Exam (Hx AK's ). The patient presents for Total-Body Skin Exam (TBSE) for skin cancer screening and mole check.  The patient has spots, moles and lesions to be evaluated, some may be new or changing.  The following portions of the chart were reviewed this encounter and updated as appropriate:   Tobacco  Allergies  Meds  Problems  Med Hx  Surg Hx  Fam Hx     Review of Systems:  No other skin or systemic complaints except as noted in HPI or Assessment and Plan.  Objective  Well appearing patient in no apparent distress; mood and affect are within normal limits.  A full examination was performed including scalp, head, eyes, ears, nose, lips, neck, chest, axillae, abdomen, back, buttocks, bilateral upper extremities, bilateral lower extremities, hands, feet, fingers, toes, fingernails, and toenails. All findings within normal limits unless otherwise noted below.  Face x 8 (8) Erythematous thin papules/macules with gritty scale.   Face Mid face erythema with telangiectasias +/- scattered inflammatory papules.    Assessment & Plan  AK (actinic keratosis) (8) Face x 8  Destruction of lesion - Face x 8 Complexity: simple   Destruction method: cryotherapy   Informed consent: discussed and consent obtained   Timeout:  patient name, date of birth, surgical site, and procedure verified Lesion destroyed using liquid nitrogen: Yes   Region frozen until ice ball extended beyond lesion: Yes   Outcome: patient tolerated procedure well with no complications   Post-procedure details: wound care instructions given    Related Medications fluorouracil (EFUDEX) 5 % cream After Thanksgiving apply the forehead, temples, and crown scalp BID x 7 days.  Rosacea Face  Rosacea is a chronic progressive skin condition usually affecting the face of adults, causing redness and/or acne bumps. It is  treatable but not curable. It sometimes affects the eyes (ocular rosacea) as well. It may respond to topical and/or systemic medication and can flare with stress, sun exposure, alcohol, exercise, topical steroids (including hydrocortisone/cortisone 10) and some foods.  Daily application of broad spectrum spf 30+ sunscreen to face is recommended to reduce flares.  Continue Skin Medicinals mix BID.   Lentigines - Scattered tan macules - Due to sun exposure - Benign-appearing, observe - Recommend daily broad spectrum sunscreen SPF 30+ to sun-exposed areas, reapply every 2 hours as needed. - Call for any changes  Seborrheic Keratoses - Stuck-on, waxy, tan-brown papules and/or plaques  - Benign-appearing - Discussed benign etiology and prognosis. - Observe - Call for any changes  Melanocytic Nevi - Tan-brown and/or pink-flesh-colored symmetric macules and papules - Benign appearing on exam today - Observation - Call clinic for new or changing moles - Recommend daily use of broad spectrum spf 30+ sunscreen to sun-exposed areas.   Hemangiomas - Red papules - Discussed benign nature - Observe - Call for any changes  Actinic Damage with PreCancerous Actinic Keratoses Counseling for Topical Chemotherapy Management: Patient exhibits: - Severe, confluent actinic changes with pre-cancerous actinic keratoses that is secondary to cumulative UV radiation exposure over time - Condition that is severe; chronic, not at goal. - diffuse scaly erythematous macules and papules with underlying dyspigmentation - Discussed Prescription "Field Treatment" topical Chemotherapy for Severe, Chronic Confluent Actinic Changes with Pre-Cancerous Actinic Keratoses Field treatment involves treatment of an entire area of skin that has confluent Actinic Changes (Sun/ Ultraviolet light damage) and PreCancerous Actinic Keratoses  by method of PhotoDynamic Therapy (PDT) and/or prescription Topical Chemotherapy agents  such as 5-fluorouracil, 5-fluorouracil/calcipotriene, and/or imiquimod.  The purpose is to decrease the number of clinically evident and subclinical PreCancerous lesions to prevent progression to development of skin cancer by chemically destroying early precancer changes that may or may not be visible.  It has been shown to reduce the risk of developing skin cancer in the treated area. As a result of treatment, redness, scaling, crusting, and open sores may occur during treatment course. One or more than one of these methods may be used and may have to be used several times to control, suppress and eliminate the PreCancerous changes. Discussed treatment course, expected reaction, and possible side effects. - Recommend daily broad spectrum sunscreen SPF 30+ to sun-exposed areas, reapply every 2 hours as needed.  - Staying in the shade or wearing long sleeves, sun glasses (UVA+UVB protection) and wide brim hats (4-inch brim around the entire circumference of the hat) are also recommended. - Call for new or changing lesions. - After Thanksgiving apply to the temples, cheeks, and crown BID x 1 week.   Skin cancer screening performed today.  Return in about 1 year (around 05/30/2023) for TBSE.  Luther Redo, CMA, am acting as scribe for Sarina Ser, MD . Documentation: I have reviewed the above documentation for accuracy and completeness, and I agree with the above.  Sarina Ser, MD

## 2022-05-29 NOTE — Patient Instructions (Addendum)
Instructions for [facility] Medications  One or more of your medications was sent to the [facility] mail order compounding pharmacy. You will receive an email from them and can purchase the medicine through that link. It will then be mailed to your home at the address you confirmed. If for any reason you do not receive an email from them, please check your spam folder. If you still do not find the email, please let us know. [facility] phone number is [phone].   5-Fluorouracil/Calcipotriene Patient Education   Actinic keratoses are the dry, red scaly spots on the skin caused by sun damage. A portion of these spots can turn into skin cancer with time, and treating them can help prevent development of skin cancer.   Treatment of these spots requires removal of the defective skin cells. There are various ways to remove actinic keratoses, including freezing with liquid nitrogen, treatment with creams, or treatment with a blue light procedure in the office.   5-fluorouracil cream is a topical cream used to treat actinic keratoses. It works by interfering with the growth of abnormal fast-growing skin cells, such as actinic keratoses. These cells peel off and are replaced by healthy ones. THIS CREAM SHOULD BE KEPT OUT OF REACH OF CHILDREN AND PETS AND SHOULD NOT BE USED BY PREGNANT WOMEN.  5-fluorouracil/calcipotriene is a combination of the 5-fluorouracil cream with a vitamin D analog cream called calcipotriene. The calcipotriene alone does not treat actinic keratoses. However, when it is combined with 5-fluorouracil, it helps the 5-fluorouracil treat the actinic keratoses much faster so that the same results can be achieved with a much shorter treatment time.  INSTRUCTIONS FOR 5-FLUOROURACIL/CALCIPOTRIENE CREAM:   5-fluorouracil/calcipotriene cream typically only needs to be used for 4-7 days. A thin layer should be applied twice a day to the treatment areas recommended by your  physician.   If your physician prescribed you separate tubes of 5-fluourouracil and calcipotriene, apply a thin layer of 5-fluorouracil followed by a thin layer of calcipotriene.   Avoid contact with your eyes or nostrils. Avoid applying the cream to your eyelids or lips unless directed to apply there by your physician. Do not use 5-fluorouracil/calcipotriene cream on infected or open wounds.   You will develop redness, irritation and some crusting at areas where you have pre-cancer damage/actinic keratoses. IF YOU DEVELOP PAIN, BLEEDING, OR SIGNIFICANT CRUSTING, STOP THE TREATMENT EARLY - you have already gotten a good response and the actinic keratoses should clear up well.  Wash your hands after applying 5-fluorouracil 5% cream on your skin.   A moisturizer or sunscreen with a minimum SPF 30 should be applied each morning.   Once you have finished the treatment, you can apply a thin layer of Vaseline twice a day to irritated areas to soothe and calm the areas more quickly. If you experience significant discomfort, contact your physician.  For some patients it is necessary to repeat the treatment for best results.  SIDE EFFECTS: When using 5-fluorouracil/calcipotriene cream, you may have mild irritation, such as redness, dryness, swelling, or a mild burning sensation. This usually resolves within 2 weeks. The more actinic keratoses you have, the more redness and inflammation you can expect during treatment. Eye irritation has been reported rarely. If this occurs, please let us know.   If you have any trouble using this cream, please send us a MyChart message or call the office. If you have any other questions about this information, please do not hesitate to ask me   before you leave the office or contact me on MyChart or by phone.    Due to recent changes in healthcare laws, you may see results of your pathology and/or laboratory studies on MyChart before the doctors have had a chance to  review them. We understand that in some cases there may be results that are confusing or concerning to you. Please understand that not all results are received at the same time and often the doctors may need to interpret multiple results in order to provide you with the best plan of care or course of treatment. Therefore, we ask that you please give us 2 business days to thoroughly review all your results before contacting the office for clarification. Should we see a critical lab result, you will be contacted sooner.   If You Need Anything After Your Visit  If you have any questions or concerns for your doctor, please call our main line at [phone] and press option 4 to reach your doctor's medical assistant. If no one answers, please leave a voicemail as directed and we will return your call as soon as possible. Messages left after 4 pm will be answered the following business day.   You may also send us a message via MyChart. We typically respond to MyChart messages within 1-2 business days.  For prescription refills, please ask your pharmacy to contact our office. Our fax number is [phone].  If you have an urgent issue when the clinic is closed that cannot wait until the next business day, you can page your doctor at the number below.    Please note that while we do our best to be available for urgent issues outside of office hours, we are not available 24/7.   If you have an urgent issue and are unable to reach us, you may choose to seek medical care at your doctor's office, retail clinic, urgent care center, or emergency room.  If you have a medical emergency, please immediately call 911 or go to the emergency department.  Pager Numbers  - Dr. Kowalski: [phone]  - Dr. Moye: [phone]  - Dr. Stewart: [phone]  In the event of inclement weather, please call our main line at [phone] for an update on the status of any delays or closures.  Dermatology Medication  Tips: Please keep the boxes that topical medications come in in order to help keep track of the instructions about where and how to use these. Pharmacies typically print the medication instructions only on the boxes and not directly on the medication tubes.   If your medication is too expensive, please contact our office at [phone] option 4 or send us a message through MyChart.   We are unable to tell what your co-pay for medications will be in advance as this is different depending on your insurance coverage. However, we may be able to find a substitute medication at lower cost or fill out paperwork to get insurance to cover a needed medication.   If a prior authorization is required to get your medication covered by your insurance company, please allow us 1-2 business days to complete this process.  Drug prices often vary depending on where the prescription is filled and some pharmacies may offer cheaper prices.  The website www.goodrx.com contains coupons for medications through different pharmacies. The prices here do not account for what the cost may be with help from insurance (it may be cheaper with your insurance), but the website can give you the price if you   did not use any insurance.  - You can print the associated coupon and take it with your prescription to the pharmacy.  - You may also stop by our office during regular business hours and pick up a GoodRx coupon card.  - If you need your prescription sent electronically to a different pharmacy, notify our office through [place] MyChart or by phone at [phone] option 4.     Si Usted Necesita Algo Despus de Su Visita  Tambin puede enviarnos un mensaje a travs de MyChart. Por lo general respondemos a los mensajes de MyChart en el transcurso de 1 a 2 das hbiles.  Para renovar recetas, por favor pida a su farmacia que se ponga en contacto con nuestra oficina. Nuestro nmero de fax es el [phone].  Si tiene un  asunto urgente cuando la clnica est cerrada y que no puede esperar hasta el siguiente da hbil, puede llamar/localizar a su doctor(a) al nmero que aparece a continuacin.   Por favor, tenga en cuenta que aunque hacemos todo lo posible para estar disponibles para asuntos urgentes fuera del horario de oficina, no estamos disponibles las 24 horas del da, los 7 das de la semana.   Si tiene un problema urgente y no puede comunicarse con nosotros, puede optar por buscar atencin mdica  en el consultorio de su doctor(a), en una clnica privada, en un centro de atencin urgente o en una sala de emergencias.  Si tiene una emergencia mdica, por favor llame inmediatamente al 911 o vaya a la sala de emergencias.  Nmeros de bper  - Dr. Kowalski: [phone]  - Dra. Moye: [phone]  - Dra. Stewart: [phone]  En caso de inclemencias del tiempo, por favor llame a nuestra lnea principal al [phone] para una actualizacin sobre el estado de cualquier retraso o cierre.  Consejos para la medicacin en dermatologa: Por favor, guarde las cajas en las que vienen los medicamentos de uso tpico para ayudarle a seguir las instrucciones sobre dnde y cmo usarlos. Las farmacias generalmente imprimen las instrucciones del medicamento slo en las cajas y no directamente en los tubos del medicamento.   Si su medicamento es muy caro, por favor, pngase en contacto con nuestra oficina llamando al [phone] y presione la opcin 4 o envenos un mensaje a travs de MyChart.   No podemos decirle cul ser su copago por los medicamentos por adelantado ya que esto es diferente dependiendo de la cobertura de su seguro. Sin embargo, es posible que podamos encontrar un medicamento sustituto a menor costo o llenar un formulario para que el seguro cubra el medicamento que se considera necesario.   Si se requiere una autorizacin previa para que su compaa de seguros cubra su medicamento, por favor  permtanos de 1 a 2 das hbiles para completar este proceso.  Los precios de los medicamentos varan con frecuencia dependiendo del lugar de dnde se surte la receta y alguna farmacias pueden ofrecer precios ms baratos.  El sitio web www.goodrx.com tiene cupones para medicamentos de diferentes farmacias. Los precios aqu no tienen en cuenta lo que podra costar con la ayuda del seguro (puede ser ms barato con su seguro), pero el sitio web puede darle el precio si no utiliz ningn seguro.  - Puede imprimir el cupn correspondiente y llevarlo con su receta a la farmacia.  - Tambin puede pasar por nuestra oficina durante el horario de atencin regular y recoger una tarjeta de cupones de GoodRx.  - Si necesita que su receta se   enve electrnicamente a una farmacia diferente, informe a nuestra oficina a travs de MyChart de [place] o por telfono llamando al [phone] y presione la opcin 4.  

## 2022-06-12 ENCOUNTER — Encounter: Payer: Self-pay | Admitting: Dermatology

## 2022-12-25 ENCOUNTER — Ambulatory Visit (INDEPENDENT_AMBULATORY_CARE_PROVIDER_SITE_OTHER): Payer: Medicare Other

## 2022-12-25 ENCOUNTER — Ambulatory Visit (INDEPENDENT_AMBULATORY_CARE_PROVIDER_SITE_OTHER): Payer: Medicare Other | Admitting: Podiatry

## 2022-12-25 VITALS — Ht 73.0 in | Wt 220.0 lb

## 2022-12-25 DIAGNOSIS — M2042 Other hammer toe(s) (acquired), left foot: Secondary | ICD-10-CM | POA: Diagnosis not present

## 2022-12-25 DIAGNOSIS — Z0000 Encounter for general adult medical examination without abnormal findings: Secondary | ICD-10-CM | POA: Diagnosis not present

## 2022-12-25 DIAGNOSIS — M19071 Primary osteoarthritis, right ankle and foot: Secondary | ICD-10-CM

## 2022-12-25 MED ORDER — CICLOPIROX 8 % EX SOLN: Freq: Every day | CUTANEOUS | 0 refills | Status: DC

## 2022-12-25 NOTE — Progress Notes (Signed)
I connected with  Derrick Stone on 12/25/22 by a audio enabled telemedicine application and verified that I am speaking with the correct person using two identifiers.  Patient Location: Home  Provider Location: Office/Clinic  I discussed the limitations of evaluation and management by telemedicine. The patient expressed understanding and agreed to proceed.  Subjective:   Derrick Stone is a 72 y.o. male who presents for Medicare Annual/Subsequent preventive examination.  Review of Systems    Cardiac Risk Factors include: advanced age (>4men, >27 women);dyslipidemia;male gender;hypertension    Objective:    Today's Vitals   12/25/22 0955  Weight: 220 lb (99.8 kg)  Height: 6\' 1"  (1.854 m)   Body mass index is 29.03 kg/m.     12/25/2022   10:10 AM 09/20/2020   12:24 PM 09/19/2020    4:13 PM 08/31/2019    8:27 AM  Advanced Directives  Does Patient Have a Medical Advance Directive? Yes No No Yes  Type of Aeronautical engineer of [place];Living will  Copy of Healthcare Power of Attorney in Chart?    No - copy requested  Would patient like information on creating a medical advance directive?  No - Patient declined      Current Medications (verified) Outpatient Encounter Medications as of 12/25/2022  Medication Sig   aspirin EC 81 MG tablet Take 81 mg by mouth daily.   atorvastatin (LIPITOR) 80 MG tablet Take 1 tablet (80 mg total) by mouth daily.   clopidogrel (PLAVIX) 75 MG tablet Take 75 mg by mouth daily.   escitalopram (LEXAPRO) 10 MG tablet TAKE 1 TABLET (10 MG TOTAL) BY MOUTH ONCE DAILY.   losartan (COZAAR) 25 MG tablet Take 1 tablet (25 mg total) by mouth daily.   metoprolol succinate (TOPROL-XL) 50 MG 24 hr tablet Take 1 tablet by mouth daily.   mometasone (NASONEX) 50 MCG/ACT nasal spray Place 2 sprays into the nose daily. As needed   Omega-3 Fatty Acids (FISH OIL PO) Take 1 capsule by mouth daily.   omeprazole (PRILOSEC OTC) 20 MG tablet Take 1 tablet by  mouth daily.   fluorouracil (EFUDEX) 5 % cream After Thanksgiving apply the forehead, temples, and crown scalp BID x 7 days. (Patient not taking: Reported on 12/25/2022)   No facility-administered encounter medications on file as of 12/25/2022.    Allergies (verified) Gold-containing drug products and Nitroglycerin   History: Past Medical History:  Diagnosis Date   CAD (coronary artery disease)    History of chickenpox    History of mumps    Hyperlipidemia    Hypertension    NSTEMI (non-ST elevated myocardial infarction) (HCC) 09/19/2020   Past Surgical History:  Procedure Laterality Date   CORONARY STENT INTERVENTION N/A 09/20/2020   Procedure: CORONARY STENT INTERVENTION;  Surgeon: Alwyn Pea, MD;  Location: [facility] INVASIVE CV LAB;  Service: Cardiovascular;  Laterality: N/A;   Heart artery stent  2008   x2   HERNIA REPAIR  2016   LEFT HEART CATH AND CORONARY ANGIOGRAPHY N/A 09/20/2020   Procedure: LEFT HEART CATH AND CORONARY ANGIOGRAPHY and possible PCI and stent;  Surgeon: Alwyn Pea, MD;  Location: [facility] INVASIVE CV LAB;  Service: Cardiovascular;  Laterality: N/A;   Family History  Problem Relation Age of Onset   Melanoma Mother    Diabetes Father    Bone cancer Father    Testicular cancer Brother    Heart disease Paternal Grandfather    Social History   Socioeconomic  History   Marital status: Married    Spouse name: Not on file   Number of children: 2   Years of education: Not on file   Highest education level: Bachelor's degree (e.g., BA, AB, BS)  Occupational History    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  Tobacco Use   Smoking status: Never   Smokeless tobacco: Never  Vaping Use   Vaping Use: Never used  Substance and Sexual Activity   Alcohol use: No   Drug use: No   Sexual activity: Not on file  Other Topics Concern   Not on file  Social History Narrative   Not on file   Social Determinants of Health   Financial  Resource Strain: Low Risk  (12/25/2022)   Overall Financial Resource Strain (CARDIA)    Difficulty of Paying Living Expenses: Not hard at all  Food Insecurity: No Food Insecurity (12/25/2022)   Hunger Vital Sign    Worried About Running Out of Food in the Last Year: Never true    Ran Out of Food in the Last Year: Never true  Transportation Needs: No Transportation Needs (12/25/2022)   PRAPARE - Administrator, Civil Service (Medical): No    Lack of Transportation (Non-Medical): No  Physical Activity: Sufficiently Active (12/25/2022)   Exercise Vital Sign    Days of Exercise per Week: 3 days    Minutes of Exercise per Session: 60 min  Stress: No Stress Concern Present (12/25/2022)   [employer] of Occupational Health - Occupational Stress Questionnaire    Feeling of Stress : Not at all  Social Connections: Moderately Integrated (12/25/2022)   Social Connection and Isolation Panel [NHANES]    Frequency of Communication with Friends and Family: More than three times a week    Frequency of Social Gatherings with Friends and Family: More than three times a week    Attends Religious Services: Never    Database administrator or Organizations: Yes    Attends Engineer, structural: More than 4 times per year    Marital Status: Married    Tobacco Counseling Counseling given: Not Answered   Clinical Intake:  Pre-visit preparation completed: Yes  Pain : No/denies pain   BMI - recorded: 29.03 Nutritional Status: BMI 25 -29 Overweight Nutritional Risks: None Diabetes: No  How often do you need to have someone help you when you read instructions, pamphlets, or other written materials from your doctor or pharmacy?: 1 - Never  Diabetic?no  Interpreter Needed?: No  Comments: lives with wife Information entered by :: B.Riely Baskett,LPN   Activities of Daily Living    12/25/2022   10:11 AM  In your present state of health, do you have any difficulty performing the  following activities:  Hearing? 0  Vision? 0  Difficulty concentrating or making decisions? 0  Walking or climbing stairs? 0  Dressing or bathing? 0  Doing errands, shopping? 0  Preparing Food and eating ? N  Using the Toilet? N  In the past six months, have you accidently leaked urine? N  Do you have problems with loss of bowel control? N  Managing your Medications? N  Managing your Finances? N  Housekeeping or managing your Housekeeping? N    Patient Care Team: Malva Limes, MD as PCP - General (Family Medicine) Deirdre Evener, MD (Dermatology) Irene Limbo., MD (Ophthalmology) Geanie Logan, MD as Referring Physician (Otolaryngology) Alwyn Pea, MD as Consulting Physician (Cardiology)  Indicate  any recent Medical Services you may have received from other than [facility] providers in the past year (date may be approximate).     Assessment:   This is a routine wellness examination for Jaun.  Hearing/Vision screen Hearing Screening - Comments:: Adequate hearing Vision Screening - Comments:: Adequate vision w/glasses [facility] Dr Jean Rosenthal  Dietary issues and exercise activities discussed: Current Exercise Habits: Structured exercise class, Type of exercise: walking, Time (Minutes): 60, Frequency (Times/Week): 3, Weekly Exercise (Minutes/Week): 180, Intensity: Mild, Exercise limited by: orthopedic condition(s);cardiac condition(s)   Goals Addressed             This Visit's Progress    DIET - INCREASE WATER INTAKE   On track    Recommend to drink at least 6-8 8oz glasses of water per day.       Depression Screen    12/25/2022   10:07 AM 12/06/2021   10:06 AM 10/24/2020    8:21 AM 08/31/2019    8:27 AM 08/31/2019    8:25 AM 08/29/2018   10:10 AM 04/12/2017    2:04 PM  PHQ 2/9 Scores  PHQ - 2 Score 0 0 0 0 0 0 0  PHQ- 9 Score  0 0   0 0    Fall Risk    12/25/2022   10:03 AM 12/06/2021   10:06 AM 10/20/2021    9:31 AM 10/24/2020    8:20 AM 08/31/2019     8:27 AM  Fall Risk   Falls in the past year? 0 0 0 0 0  Number falls in past yr: 0 0 0 0 0  Injury with Fall? 0 0  0 0  Risk for fall due to : No Fall Risks No Fall Risks Medication side effect    Follow up Education provided;Falls prevention discussed Falls evaluation completed Falls prevention discussed      FALL RISK PREVENTION PERTAINING TO THE HOME:  Any stairs in or around the home? Yes  If so, are there any without handrails? Yes  Home free of loose throw rugs in walkways, pet beds, electrical cords, etc? Yes  Adequate lighting in your home to reduce risk of falls? Yes   ASSISTIVE DEVICES UTILIZED TO PREVENT FALLS:  Life alert? No  Use of a cane, walker or w/c? No  Grab bars in the bathroom? No  Shower chair or bench in shower? Yes  Elevated toilet seat or a handicapped toilet? No   Cognitive Function:        12/25/2022   10:21 AM 12/06/2021   10:01 AM 08/29/2018   10:10 AM  6CIT Screen  What Year? 0 points 0 points 0 points  What month? 0 points 0 points 0 points  What time? 0 points 0 points 0 points  Count back from 20 0 points 0 points 0 points  Months in reverse 0 points 0 points 0 points  Repeat phrase 0 points 2 points 0 points  Total Score 0 points 2 points 0 points    Immunizations Immunization History  Administered Date(s) Administered   Fluad Quad(high Dose 65+) 08/31/2019   Influenza Split 04/04/2009   Influenza, High Dose Seasonal PF 08/08/2018   Influenza,inj,Quad PF,6+ Mos 07/23/2013, 04/12/2017   Pneumococcal Conjugate-13 10/24/2020   Pneumococcal Polysaccharide-23 08/29/2018   Tdap 04/04/2009   Zoster Recombinat (Shingrix) 04/12/2017, 06/12/2017   Zoster, Live 07/23/2013    TDAP status: Up to date  Flu Vaccine status: Up to date  Pneumococcal vaccine status: Up to date  Covid-19 vaccine status: Declined, Education has been provided regarding the importance of this vaccine but patient still declined. Advised may receive this  vaccine at local pharmacy or Health Dept.or vaccine clinic. Aware to provide a copy of the vaccination record if obtained from local pharmacy or Health Dept. Verbalized acceptance and understanding.  Qualifies for Shingles Vaccine? Yes   Zostavax completed Yes   Shingrix Completed?: Yes  Screening Tests Health Maintenance  Topic Date Due   COVID-19 Vaccine (1) Never done   DTaP/Tdap/Td (2 - Td or Tdap) 04/05/2019   INFLUENZA VACCINE  02/14/2023   Colonoscopy  10/28/2023   Medicare Annual Wellness (AWV)  12/25/2023   Pneumonia Vaccine 78+ Years old  Completed   Hepatitis C Screening  Completed   Zoster Vaccines- Shingrix  Completed   HPV VACCINES  Aged Out    Health Maintenance  Health Maintenance Due  Topic Date Due   COVID-19 Vaccine (1) Never done   DTaP/Tdap/Td (2 - Td or Tdap) 04/05/2019    Colorectal cancer screening: Type of screening: Colonoscopy. Completed yes. Repeat every 10 years  Lung Cancer Screening: (Low Dose CT Chest recommended if Age 54-80 years, 30 pack-year currently smoking OR have quit w/in 15years.) does not qualify.   Lung Cancer Screening Referral: no  Additional Screening:  Hepatitis C Screening: does not qualify; Completed yes  Vision Screening: Recommended annual ophthalmology exams for early detection of glaucoma and other disorders of the eye. Is the patient up to date with their annual eye exam?  Yes  Who is the provider or what is the name of the office in which the patient attends annual eye exams? Dr Jean Rosenthal If pt is not established with a provider, would they like to be referred to a provider to establish care? No .   Dental Screening: Recommended annual dental exams for proper oral hygiene  Community Resource Referral / Chronic Care Management: CRR required this visit?  No   CCM required this visit?  No      Plan:     I have personally reviewed and noted the following in the patient's chart:   Medical and social history Use  of alcohol, tobacco or illicit drugs  Current medications and supplements including opioid prescriptions. Patient is not currently taking opioid prescriptions. Functional ability and status Nutritional status Physical activity Advanced directives List of other physicians Hospitalizations, surgeries, and ER visits in previous 12 months Vitals Screenings to include cognitive, depression, and falls Referrals and appointments  In addition, I have reviewed and discussed with patient certain preventive protocols, quality metrics, and best practice recommendations. A written personalized care plan for preventive services as well as general preventive health recommendations were provided to patient.    Sue Lush, LPN   1/61/0960   Nurse Notes: The patient states they are doing well and has no concerns or questions at this time.

## 2022-12-25 NOTE — Progress Notes (Signed)
Subjective:  Patient ID: Derrick Stone, male    DOB: 1951-03-03,  MRN: [id number]  Chief Complaint  Patient presents with   Nail Problem    Fungus on the nails  Place between the toes causing discomfort     72 y.o. male presents with the above complaint.  Patient presents with right hallux IPJ arthritis/capsulitis.  Patient states pain for touch is progressive and worse hurts with ambulation or shoe pressure.  He has secondary complaint of left third digit hammertoe contracture with painful to distal tip he has not seen anyone as prior to seeing me for this he denies any other acute complaints he would like to discuss treatment options for both of these things.  He does not want surgical intervention if not needed he is not a diabetic  Review of Systems: Negative except as noted in the HPI. Denies N/V/F/Ch.  Past Medical History:  Diagnosis Date   CAD (coronary artery disease)    History of chickenpox    History of mumps    Hyperlipidemia    Hypertension    NSTEMI (non-ST elevated myocardial infarction) (HCC) 09/19/2020    Current Outpatient Medications:    ciclopirox (PENLAC) 8 % solution, Apply topically at bedtime. Apply over nail and surrounding skin. Apply daily over previous coat. After seven (7) days, may remove with alcohol and continue cycle., Disp: 6.6 mL, Rfl: 0   aspirin EC 81 MG tablet, Take 81 mg by mouth daily., Disp: , Rfl:    atorvastatin (LIPITOR) 80 MG tablet, Take 1 tablet (80 mg total) by mouth daily., Disp: 90 tablet, Rfl: 1   clopidogrel (PLAVIX) 75 MG tablet, Take 75 mg by mouth daily., Disp: , Rfl:    escitalopram (LEXAPRO) 10 MG tablet, TAKE 1 TABLET (10 MG TOTAL) BY MOUTH ONCE DAILY., Disp: , Rfl: 1   fluorouracil (EFUDEX) 5 % cream, After Thanksgiving apply the forehead, temples, and crown scalp BID x 7 days. (Patient not taking: Reported on 12/25/2022), Disp: 30 g, Rfl: 0   losartan (COZAAR) 25 MG tablet, Take 1 tablet (25 mg total) by mouth daily., Disp:  30 tablet, Rfl: 1   metoprolol succinate (TOPROL-XL) 50 MG 24 hr tablet, Take 1 tablet by mouth daily., Disp: , Rfl:    mometasone (NASONEX) 50 MCG/ACT nasal spray, Place 2 sprays into the nose daily. As needed, Disp: , Rfl:    Omega-3 Fatty Acids (FISH OIL PO), Take 1 capsule by mouth daily., Disp: , Rfl:    omeprazole (PRILOSEC OTC) 20 MG tablet, Take 1 tablet by mouth daily., Disp: , Rfl:   Social History   Tobacco Use  Smoking Status Never  Smokeless Tobacco Never    Allergies  Allergen Reactions   Gold-Containing Drug Products    Nitroglycerin Other (See Comments)    Passed out when given to him once   Objective:  There were no vitals filed for this visit. There is no height or weight on file to calculate BMI. Constitutional Well developed. Well nourished.  Vascular Dorsalis pedis pulses palpable bilaterally. Posterior tibial pulses palpable bilaterally. Capillary refill normal to all digits.  No cyanosis or clubbing noted. Pedal hair growth normal.  Neurologic Normal speech. Oriented to person, place, and time. Epicritic sensation to light touch grossly present bilaterally.  Dermatologic Nails well groomed and normal in appearance. No open wounds. No skin lesions.  Orthopedic: Pain on palpation right hallux IPJ pain with range of motion of the IPJ some crepitus clinically appreciated.  Pain on palpation to the area.  No pain at the metatarsophalangeal joint  Pain on palpation to the left third digit hammertoe contracture.  Semiflexible contracture noted.   Radiographs: None Assessment:   1. Arthritis of joint of lesser toe, right   2. Hammertoe of left foot    Plan:  Patient was evaluated and treated and all questions answered.  Right hallux IPJ arthritis -All questions and concerns were discussed with the patient in extensive detail given the amount of pain that he is having a steroid injection at decreasing inflammatory complaints of pain.  Patient agrees with  plan to proceed with steroid injection -A steroid injection was performed at right hallux IPJ using 1% plain Lidocaine and 10 mg of Kenalog. This was well tolerated.  Left third digit hammertoe contracture -I discussed flexor tenotomy as a possible solution to the contracture given that a semiflexible.  He will think about it and get back to me when he is ready

## 2022-12-25 NOTE — Patient Instructions (Signed)
Mr. Derrick Stone , Thank you for taking time to come for your Medicare Wellness Visit. I appreciate your ongoing commitment to your health goals. Please review the following plan we discussed and let me know if I can assist you in the future.   These are the goals we discussed:  Goals      DIET - INCREASE WATER INTAKE     Recommend to drink at least 6-8 8oz glasses of water per day.        This is a list of the screening recommended for you and due dates:  Health Maintenance  Topic Date Due   COVID-19 Vaccine (1) Never done   DTaP/Tdap/Td vaccine (2 - Td or Tdap) 04/05/2019   Flu Shot  02/14/2023   Colon Cancer Screening  10/28/2023   Medicare Annual Wellness Visit  12/25/2023   Pneumonia Vaccine  Completed   Hepatitis C Screening  Completed   Zoster (Shingles) Vaccine  Completed   HPV Vaccine  Aged Out    Advanced directives: yes  Conditions/risks identified: none  Next appointment: Follow up in one year for your annual wellness visit. 12/30/2023 @9 :45am telephone  Preventive Care 65 Years and Older, Male  Preventive care refers to lifestyle choices and visits with your health care provider that can promote health and wellness. What does preventive care include? A yearly physical exam. This is also called an annual well check. Dental exams once or twice a year. Routine eye exams. Ask your health care provider how often you should have your eyes checked. Personal lifestyle choices, including: Daily care of your teeth and gums. Regular physical activity. Eating a healthy diet. Avoiding tobacco and drug use. Limiting alcohol use. Practicing safe sex. Taking low doses of aspirin every day. Taking vitamin and mineral supplements as recommended by your health care provider. What happens during an annual well check? The services and screenings done by your health care provider during your annual well check will depend on your age, overall health, lifestyle risk factors, and family  history of disease. Counseling  Your health care provider may ask you questions about your: Alcohol use. Tobacco use. Drug use. Emotional well-being. Home and relationship well-being. Sexual activity. Eating habits. History of falls. Memory and ability to understand (cognition). Work and work Astronomer. Screening  You may have the following tests or measurements: Height, weight, and BMI. Blood pressure. Lipid and cholesterol levels. These may be checked every 5 years, or more frequently if you are over 70 years old. Skin check. Lung cancer screening. You may have this screening every year starting at age 70 if you have a 30-pack-year history of smoking and currently smoke or have quit within the past 15 years. Fecal occult blood test (FOBT) of the stool. You may have this test every year starting at age 70. Flexible sigmoidoscopy or colonoscopy. You may have a sigmoidoscopy every 5 years or a colonoscopy every 10 years starting at age 70. Prostate cancer screening. Recommendations will vary depending on your family history and other risks. Hepatitis C blood test. Hepatitis B blood test. Sexually transmitted disease (STD) testing. Diabetes screening. This is done by checking your blood sugar (glucose) after you have not eaten for a while (fasting). You may have this done every 1-3 years. Abdominal aortic aneurysm (AAA) screening. You may need this if you are a current or former smoker. Osteoporosis. You may be screened starting at age 70 if you are at high risk. Talk with your health care provider about your  test results, treatment options, and if necessary, the need for more tests. Vaccines  Your health care provider may recommend certain vaccines, such as: Influenza vaccine. This is recommended every year. Tetanus, diphtheria, and acellular pertussis (Tdap, Td) vaccine. You may need a Td booster every 10 years. Zoster vaccine. You may need this after age 70. Pneumococcal  13-valent conjugate (PCV13) vaccine. One dose is recommended after age 70. Pneumococcal polysaccharide (PPSV23) vaccine. One dose is recommended after age 70. Talk to your health care provider about which screenings and vaccines you need and how often you need them. This information is not intended to replace advice given to you by your health care provider. Make sure you discuss any questions you have with your health care provider. Document Released: 07/29/2015 Document Revised: 03/21/2016 Document Reviewed: 05/03/2015 Elsevier Interactive Patient Education  2017 [employer].  Fall Prevention in the Home Falls can cause injuries. They can happen to people of all ages. There are many things you can do to make your home safe and to help prevent falls. What can I do on the outside of my home? Regularly fix the edges of walkways and driveways and fix any cracks. Remove anything that might make you trip as you walk through a door, such as a raised step or threshold. Trim any bushes or trees on the path to your home. Use bright outdoor lighting. Clear any walking paths of anything that might make someone trip, such as rocks or tools. Regularly check to see if handrails are loose or broken. Make sure that both sides of any steps have handrails. Any raised decks and porches should have guardrails on the edges. Have any leaves, snow, or ice cleared regularly. Use sand or salt on walking paths during winter. Clean up any spills in your garage right away. This includes oil or grease spills. What can I do in the bathroom? Use night lights. Install grab bars by the toilet and in the tub and shower. Do not use towel bars as grab bars. Use non-skid mats or decals in the tub or shower. If you need to sit down in the shower, use a plastic, non-slip stool. Keep the floor dry. Clean up any water that spills on the floor as soon as it happens. Remove soap buildup in the tub or shower regularly. Attach  bath mats securely with double-sided non-slip rug tape. Do not have throw rugs and other things on the floor that can make you trip. What can I do in the bedroom? Use night lights. Make sure that you have a light by your bed that is easy to reach. Do not use any sheets or blankets that are too big for your bed. They should not hang down onto the floor. Have a firm chair that has side arms. You can use this for support while you get dressed. Do not have throw rugs and other things on the floor that can make you trip. What can I do in the kitchen? Clean up any spills right away. Avoid walking on wet floors. Keep items that you use a lot in easy-to-reach places. If you need to reach something above you, use a strong step stool that has a grab bar. Keep electrical cords out of the way. Do not use floor polish or wax that makes floors slippery. If you must use wax, use non-skid floor wax. Do not have throw rugs and other things on the floor that can make you trip. What can I do with my  stairs? Do not leave any items on the stairs. Make sure that there are handrails on both sides of the stairs and use them. Fix handrails that are broken or loose. Make sure that handrails are as long as the stairways. Check any carpeting to make sure that it is firmly attached to the stairs. Fix any carpet that is loose or worn. Avoid having throw rugs at the top or bottom of the stairs. If you do have throw rugs, attach them to the floor with carpet tape. Make sure that you have a light switch at the top of the stairs and the bottom of the stairs. If you do not have them, ask someone to add them for you. What else can I do to help prevent falls? Wear shoes that: Do not have high heels. Have rubber bottoms. Are comfortable and fit you well. Are closed at the toe. Do not wear sandals. If you use a stepladder: Make sure that it is fully opened. Do not climb a closed stepladder. Make sure that both sides of the  stepladder are locked into place. Ask someone to hold it for you, if possible. Clearly mark and make sure that you can see: Any grab bars or handrails. First and last steps. Where the edge of each step is. Use tools that help you move around (mobility aids) if they are needed. These include: Canes. Walkers. Scooters. Crutches. Turn on the lights when you go into a dark area. Replace any light bulbs as soon as they burn out. Set up your furniture so you have a clear path. Avoid moving your furniture around. If any of your floors are uneven, fix them. If there are any pets around you, be aware of where they are. Review your medicines with your doctor. Some medicines can make you feel dizzy. This can increase your chance of falling. Ask your doctor what other things that you can do to help prevent falls. This information is not intended to replace advice given to you by your health care provider. Make sure you discuss any questions you have with your health care provider. Document Released: 04/28/2009 Document Revised: 12/08/2015 Document Reviewed: 08/06/2014 Elsevier Interactive Patient Education  2017 [employer].

## 2022-12-26 DIAGNOSIS — I214 Non-ST elevation (NSTEMI) myocardial infarction: Secondary | ICD-10-CM | POA: Diagnosis not present

## 2022-12-26 DIAGNOSIS — K219 Gastro-esophageal reflux disease without esophagitis: Secondary | ICD-10-CM | POA: Diagnosis not present

## 2022-12-26 DIAGNOSIS — E782 Mixed hyperlipidemia: Secondary | ICD-10-CM | POA: Diagnosis not present

## 2022-12-26 DIAGNOSIS — R5383 Other fatigue: Secondary | ICD-10-CM | POA: Diagnosis not present

## 2022-12-26 DIAGNOSIS — I10 Essential (primary) hypertension: Secondary | ICD-10-CM | POA: Diagnosis not present

## 2022-12-26 DIAGNOSIS — Z955 Presence of coronary angioplasty implant and graft: Secondary | ICD-10-CM | POA: Diagnosis not present

## 2022-12-26 DIAGNOSIS — R7303 Prediabetes: Secondary | ICD-10-CM | POA: Diagnosis not present

## 2022-12-26 DIAGNOSIS — I2510 Atherosclerotic heart disease of native coronary artery without angina pectoris: Secondary | ICD-10-CM | POA: Diagnosis not present

## 2023-01-24 ENCOUNTER — Ambulatory Visit (INDEPENDENT_AMBULATORY_CARE_PROVIDER_SITE_OTHER): Payer: Medicare Other | Admitting: Podiatry

## 2023-01-24 DIAGNOSIS — M205X2 Other deformities of toe(s) (acquired), left foot: Secondary | ICD-10-CM | POA: Diagnosis not present

## 2023-01-24 NOTE — Progress Notes (Signed)
  Subjective:  Patient ID: Derrick Stone, male    DOB: [birth year],  MRN: [id number]  No chief complaint on file.  72 y.o. male returns today for planned flexor tenotomy of the left third digit digit.  Objective:  There were no vitals filed for this visit.  General AA&O x3. Normal mood and affect.  Vascular Pedal pulses palpable.  Neurologic Epicritic sensation grossly intact.  Dermatologic Pre-ulcerative callus at the tip of the left, 3rd toe  Orthopedic: Semi-reducible hammertoe deformity left, 3rd toe    Assessment & Plan:  Patient was evaluated and treated and all questions answered.  Hammertoe left third with pre-ulcerative callus -Flexor tenotomy as below. -Advised to remove the dressing in 24 hours and apply a band-aid and triple abx ointment every day thereafter.  Procedure: Flexor Tenotomy Indication for Procedure: toe with semi-reducible hammertoe with distal tip ulceration. Flexor tenotomy indicated to alleviate contracture, reduce pressure, and enhance healing of the ulceration. Location: left, 3rd toe Anesthesia: Lidocaine 1% plain; 1.5 mL and Marcaine 0.5% plain; 1.5 mL digital block Instrumentation: #11 blade Technique: The toe was anesthetized as above and prepped in the usual fashion. The toe was exsanquinated and a tourniquet was secured at the base of the toe. An 18g needle was then used to percutaneously release the flexor tendon at the plantar surface of the toe with noted release of the hammertoe deformity. The incision was then dressed with antibiotic ointment and band-aid. Compression splint dressing applied. Patient tolerated the procedure well. Dressing: Dry, sterile, compression dressing. Disposition: Patient tolerated procedure well. Patient to return in 1 week for follow-up.      No follow-ups on file.

## 2023-01-30 ENCOUNTER — Encounter: Payer: Self-pay | Admitting: Family Medicine

## 2023-01-30 ENCOUNTER — Ambulatory Visit (INDEPENDENT_AMBULATORY_CARE_PROVIDER_SITE_OTHER): Payer: Medicare Other | Admitting: Family Medicine

## 2023-01-30 VITALS — BP 108/73 | HR 70 | Temp 98.2°F | Resp 12 | Ht 73.0 in | Wt 222.6 lb

## 2023-01-30 DIAGNOSIS — E7800 Pure hypercholesterolemia, unspecified: Secondary | ICD-10-CM | POA: Diagnosis not present

## 2023-01-30 DIAGNOSIS — R7303 Prediabetes: Secondary | ICD-10-CM

## 2023-01-30 DIAGNOSIS — I10 Essential (primary) hypertension: Secondary | ICD-10-CM | POA: Diagnosis not present

## 2023-01-30 DIAGNOSIS — I2510 Atherosclerotic heart disease of native coronary artery without angina pectoris: Secondary | ICD-10-CM

## 2023-01-30 DIAGNOSIS — Z23 Encounter for immunization: Secondary | ICD-10-CM | POA: Diagnosis not present

## 2023-01-30 DIAGNOSIS — Z125 Encounter for screening for malignant neoplasm of prostate: Secondary | ICD-10-CM

## 2023-01-30 MED ORDER — TETANUS-DIPHTH-ACELL PERTUSSIS 5-2.5-18.5 LF-MCG/0.5 IM SUSY: 0.5000 mL | PREFILLED_SYRINGE | Freq: Once | INTRAMUSCULAR | 0 refills | Status: AC

## 2023-01-30 NOTE — Patient Instructions (Addendum)
Please review the attached list of medications and notify my office if there are any errors.   You are due for a Tdap (tetanus-diptheria-pertussis vaccine) which protects you from tetanus and whooping cough. Please check with your insurance plan or pharmacy regarding coverage for this vaccine.   Please contact your eyecare professional to schedule a routine eye exam   You can try weaning Lexapro (escitalopram) by taking 1/2 tablet every day for 1-2 months, then 1/2 tablet every Other day for 1-2 months, then stop if you aren't having any problems.   You can wean Prilosec OTC by taking 1 tablet every OTHER day for a month, then 1 tablet every THIRD day for a month, then just as needed.

## 2023-01-30 NOTE — Progress Notes (Signed)
Established patient visit   Patient: Derrick Stone   DOB: 05-Jul-1951   72 y.o. Male  MRN: [id number] Visit Date: 01/30/2023  Today's healthcare provider: Mila Merry, MD   Chief Complaint  Patient presents with   General medical follow up   Subjective    HPI  Discussed the use of AI scribe software for clinical note transcription with the patient, who gave verbal consent to proceed.  History of Present Illness   The patient, with a history of cardiovascular disease presents for a routine annual checkup. He reports overall good health with no new complaints or changes in his condition. He has been adhering to his medication regimen without any adverse effects.   He is now followed by Dr. Juliann Pares for CAD and had follow up last month with no concerns. he denies chest pains, palpitations, or dyspnea   He has been taking Prilosec OTC ever since having STEMI in 2022, but never had problems with reflux symptoms before that, and has not had any reflux sx for the last year or so.   The patient has been taking Lexapro 10mg  for stress and nerves for several years, but feels he may not need it anymore. He is open to the idea of gradually reducing the dose to assess his response. He also takes omeprazole daily for heartburn, which he only experienced during his first heart attack. He is open to the idea of gradually reducing this medication as well.  The patient maintains an active lifestyle, including golfing, but has recently been limited due to a foot condition. He is currently being treated for a hammer toe. He reports occasional leg cramps, particularly in the leg with the foot condition.  The patient is due for a tetanus vaccine, which he has not yet received. He is also due for his annual eye examination, which he has not yet scheduled for this year. He denies any hearing problems or ringing in the ears, and any sinus or allergy problems are minor and manageable. He denies any  stomach problems or cramping. He does not smoke or consume alcohol.       Medications: Outpatient Medications Prior to Visit  Medication Sig   aspirin EC 81 MG tablet Take 81 mg by mouth daily.   atorvastatin (LIPITOR) 80 MG tablet Take 1 tablet (80 mg total) by mouth daily.   ciclopirox (PENLAC) 8 % solution Apply topically at bedtime. Apply over nail and surrounding skin. Apply daily over previous coat. After seven (7) days, may remove with alcohol and continue cycle.   clopidogrel (PLAVIX) 75 MG tablet Take 75 mg by mouth daily.   escitalopram (LEXAPRO) 10 MG tablet TAKE 1 TABLET (10 MG TOTAL) BY MOUTH ONCE DAILY.   fluorouracil (EFUDEX) 5 % cream After Thanksgiving apply the forehead, temples, and crown scalp BID x 7 days.   losartan (COZAAR) 25 MG tablet Take 1 tablet (25 mg total) by mouth daily.   metoprolol succinate (TOPROL-XL) 50 MG 24 hr tablet Take 1 tablet by mouth daily.   Omega-3 Fatty Acids (FISH OIL PO) Take 1 capsule by mouth daily.   omeprazole (PRILOSEC OTC) 20 MG tablet Take 1 tablet by mouth daily.   [DISCONTINUED] mometasone (NASONEX) 50 MCG/ACT nasal spray Place 2 sprays into the nose daily. As needed   No facility-administered medications prior to visit.       Objective    BP 108/73 (BP Location: Left Arm, Patient Position: Sitting, Cuff Size: Large)  Pulse 70   Temp 98.2 F (36.8 C) (Temporal)   Resp 12   Ht 6\' 1"  (1.854 m)   Wt 222 lb 9.6 oz (101 kg)   SpO2 99%   BMI 29.37 kg/m     Physical Exam   HEENT: Ears without abnormalities. Nasal mucosa without abnormalities. Oropharynx without erythema or exudates. Extraocular movements intact. CHEST: Lung fields clear to auscultation bilaterally without wheezes, rales, or rhonchi. CARDIOVASCULAR: Heart sounds normal without murmurs, rubs, or gallops. NEUROLOGICAL: Finger-to-nose and heel-to-shin tests normal.       Assessment & Plan     Assessment and Plan    Hyperlipidemia wiht history of  CAD/STEMI: Stable on Lipitor 80mg  daily. No reported side effects. -Continue Lipitor 80mg  daily. -Continue regular follow up with Dr. Juliann Pares  Anxiety/Stress: On Lexapro 10mg  daily. Patient expressed interest in discontinuing medication. Discussed potential withdrawal symptoms and the importance of gradual dose reduction. -Reduce Lexapro to 5mg  daily for 1-2 months. -If well-tolerated, further reduce to 5mg  every other day for 1-2 months. -If still well-tolerated, discontinue Lexapro.  Gastroesophageal Reflux Disease (GERD): On daily Omeprazole. Patient does not report any current symptoms of GERD. -Reduce Omeprazole to every other day for 3-4 weeks. -If well-tolerated, further reduce to every third day. -If still well-tolerated, discontinue Omeprazole.  General Health Maintenance: -Order routine blood work. -Administer tetanus vaccine today.          Mila Merry, MD  [facility] [phone] (phone) [phone] (fax)  [facility]

## 2023-01-31 ENCOUNTER — Ambulatory Visit (INDEPENDENT_AMBULATORY_CARE_PROVIDER_SITE_OTHER): Payer: Medicare Other | Admitting: Podiatry

## 2023-01-31 DIAGNOSIS — L539 Erythematous condition, unspecified: Secondary | ICD-10-CM

## 2023-01-31 LAB — CBC
Hematocrit: 42.1 % (ref 37.5–51.0)
Hemoglobin: 14.6 g/dL (ref 13.0–17.7)
MCH: 30.9 pg (ref 26.6–33.0)
MCHC: 34.7 g/dL (ref 31.5–35.7)
MCV: 89 fL (ref 79–97)
Platelets: 135 10*3/uL — ABNORMAL LOW (ref 150–450)
RBC: 4.72 x10E6/uL (ref 4.14–5.80)
RDW: 12.2 % (ref 11.6–15.4)
WBC: 12.7 10*3/uL — ABNORMAL HIGH (ref 3.4–10.8)

## 2023-01-31 LAB — PSA TOTAL (REFLEX TO FREE): Prostate Specific Ag, Serum: 3.8 ng/mL (ref 0.0–4.0)

## 2023-01-31 LAB — COMPREHENSIVE METABOLIC PANEL
ALT: 18 IU/L (ref 0–44)
AST: 22 IU/L (ref 0–40)
Albumin: 4.9 g/dL (ref 3.9–4.9)
Alkaline Phosphatase: 54 IU/L (ref 44–121)
BUN/Creatinine Ratio: 18 (ref 10–24)
BUN: 22 mg/dL (ref 8–27)
Bilirubin Total: 0.4 mg/dL (ref 0.0–1.2)
CO2: 24 mmol/L (ref 20–29)
Calcium: 9.5 mg/dL (ref 8.6–10.2)
Chloride: 102 mmol/L (ref 96–106)
Creatinine, Ser: 1.21 mg/dL (ref 0.76–1.27)
Globulin, Total: 2.8 g/dL (ref 1.5–4.5)
Glucose: 104 mg/dL — ABNORMAL HIGH (ref 70–99)
Potassium: 4.7 mmol/L (ref 3.5–5.2)
Sodium: 140 mmol/L (ref 134–144)
Total Protein: 7.7 g/dL (ref 6.0–8.5)
eGFR: 65 mL/min/{1.73_m2} (ref >59)

## 2023-01-31 LAB — HEMOGLOBIN A1C
Est. average glucose Bld gHb Est-mCnc: 126 mg/dL
Hgb A1c MFr Bld: 6 % — ABNORMAL HIGH (ref 4.8–5.6)

## 2023-01-31 LAB — LIPID PANEL
Chol/HDL Ratio: 3.4 ratio (ref 0.0–5.0)
Cholesterol, Total: 151 mg/dL (ref 100–199)
HDL: 45 mg/dL (ref >39)
LDL Chol Calc (NIH): 87 mg/dL (ref 0–99)
Triglycerides: 105 mg/dL (ref 0–149)
VLDL Cholesterol Cal: 19 mg/dL (ref 5–40)

## 2023-01-31 MED ORDER — DOXYCYCLINE HYCLATE 100 MG PO TABS: 100.0000 mg | ORAL_TABLET | Freq: Two times a day (BID) | ORAL | 0 refills | Status: AC

## 2023-01-31 NOTE — Progress Notes (Signed)
  Subjective:  Patient ID: Derrick Stone, male    DOB: 10/28/1950,  MRN: [id number]  Chief Complaint  Patient presents with   Toe Pain    Pt stated that he is having a lot pain from the procedure done last week  He can not get the bandage off he stated that he could not walk yesterday    72 y.o. male returns today for follow-up for flexor tenotomy of the third digit.  He states he is having some pain especially to the fourth toe.  There are some redness he denies any other acute complaints  Objective:  There were no vitals filed for this visit.  General AA&O x3. Normal mood and affect.  Vascular Pedal pulses palpable.  Neurologic Epicritic sensation grossly intact.  Dermatologic No further pre-ulcerative callus at the tip of the left, 3rd toe.  Left fourth digit erythema noted with superficial breakdown of the skin secondary to bandage rubbing against it  Orthopedic: No further semi-reducible hammertoe deformity left, 3rd toe    Assessment & Plan:  Patient was evaluated and treated and all questions answered.  Hammertoe left third with pre-ulcerative callus -Clinically doing well and completely healed.  Left fourth digit erythema/superficial skin infection -Given the redness is present patient will benefit from antibiotics doxycycline was dispensed.  No other signs of deeper infection noted.  This may have been attribute it to bandage rubbing against the fourth toe from the previous third toe bandaging.  -Doxycycline was dispensed encouraged him to finish the course    No follow-ups on file.

## 2023-02-03 ENCOUNTER — Other Ambulatory Visit: Payer: Self-pay | Admitting: Family Medicine

## 2023-02-03 DIAGNOSIS — E7800 Pure hypercholesterolemia, unspecified: Secondary | ICD-10-CM

## 2023-02-03 MED ORDER — EZETIMIBE 10 MG PO TABS: 10.0000 mg | ORAL_TABLET | Freq: Every day | ORAL | 3 refills | Status: DC

## 2023-02-07 ENCOUNTER — Ambulatory Visit: Payer: Medicare Other | Admitting: Podiatry

## 2023-02-21 ENCOUNTER — Ambulatory Visit (INDEPENDENT_AMBULATORY_CARE_PROVIDER_SITE_OTHER): Payer: Medicare Other | Admitting: Podiatry

## 2023-02-21 DIAGNOSIS — M205X2 Other deformities of toe(s) (acquired), left foot: Secondary | ICD-10-CM

## 2023-02-21 DIAGNOSIS — L539 Erythematous condition, unspecified: Secondary | ICD-10-CM

## 2023-02-21 NOTE — Progress Notes (Signed)
  Subjective:  Patient ID: Derrick Stone, male    DOB: 1951-01-15,  MRN: [id number]  Chief Complaint  Patient presents with   Toe Pain   72 y.o. male returns today for follow-up for flexor tenotomy of the third digit.  He states he is having some pain especially to the fourth toe.  No further redness noted no acute complaints.  Pain resolved  Objective:  There were no vitals filed for this visit.  General AA&O x3. Normal mood and affect.  Vascular Pedal pulses palpable.  Neurologic Epicritic sensation grossly intact.  Dermatologic No further pre-ulcerative callus at the tip of the left, 3rd toe.  No further breakdown noted to the fourth digit.  Hammertoe contracture noted to the fourth digit.  Orthopedic: No further semi-reducible hammertoe deformity left, 3rd toe    Assessment & Plan:  Patient was evaluated and treated and all questions answered.  Hammertoe left third with pre-ulcerative callus -Clinically doing well and completely healed.  Left fourth digit erythema/superficial skin infection -Clinically healed.  Further no further erythema noted.  I discussed sugar modification prevention technique if any foot and ankle issues on the future he will come back and see me.    No follow-ups on file.

## 2023-05-15 ENCOUNTER — Encounter
Admission: EM | Disposition: A | Discharge status: Home / Self Care | Payer: Self-pay | Source: Home / Self Care | Attending: Osteopathic Medicine

## 2023-05-15 ENCOUNTER — Inpatient Hospital Stay
Admit: 2023-05-15 | Discharge: 2023-05-15 | Disposition: A | Discharge status: Home / Self Care | Payer: Medicare Other | Attending: Student | Admitting: Student

## 2023-05-15 ENCOUNTER — Other Ambulatory Visit: Payer: Self-pay

## 2023-05-15 ENCOUNTER — Inpatient Hospital Stay
Admission: EM | Admit: 2023-05-15 | Discharge: 2023-05-17 | DRG: 251 | Disposition: A | Discharge status: Home / Self Care | Payer: Medicare Other | Attending: Osteopathic Medicine | Admitting: Osteopathic Medicine

## 2023-05-15 ENCOUNTER — Emergency Department: Payer: Medicare Other

## 2023-05-15 ENCOUNTER — Encounter: Payer: Self-pay | Admitting: Internal Medicine

## 2023-05-15 DIAGNOSIS — I10 Essential (primary) hypertension: Secondary | ICD-10-CM | POA: Diagnosis present

## 2023-05-15 DIAGNOSIS — Z808 Family history of malignant neoplasm of other organs or systems: Secondary | ICD-10-CM | POA: Diagnosis not present

## 2023-05-15 DIAGNOSIS — K219 Gastro-esophageal reflux disease without esophagitis: Secondary | ICD-10-CM | POA: Diagnosis present

## 2023-05-15 DIAGNOSIS — I2510 Atherosclerotic heart disease of native coronary artery without angina pectoris: Secondary | ICD-10-CM | POA: Diagnosis not present

## 2023-05-15 DIAGNOSIS — I2582 Chronic total occlusion of coronary artery: Secondary | ICD-10-CM | POA: Diagnosis present

## 2023-05-15 DIAGNOSIS — E785 Hyperlipidemia, unspecified: Secondary | ICD-10-CM | POA: Diagnosis present

## 2023-05-15 DIAGNOSIS — Z833 Family history of diabetes mellitus: Secondary | ICD-10-CM | POA: Diagnosis not present

## 2023-05-15 DIAGNOSIS — Z888 Allergy status to other drugs, medicaments and biological substances status: Secondary | ICD-10-CM

## 2023-05-15 DIAGNOSIS — R231 Pallor: Secondary | ICD-10-CM | POA: Diagnosis not present

## 2023-05-15 DIAGNOSIS — Z79899 Other long term (current) drug therapy: Secondary | ICD-10-CM | POA: Diagnosis not present

## 2023-05-15 DIAGNOSIS — Z8249 Family history of ischemic heart disease and other diseases of the circulatory system: Secondary | ICD-10-CM

## 2023-05-15 DIAGNOSIS — R7303 Prediabetes: Secondary | ICD-10-CM | POA: Diagnosis present

## 2023-05-15 DIAGNOSIS — I2129 ST elevation (STEMI) myocardial infarction involving other sites: Principal | ICD-10-CM | POA: Diagnosis present

## 2023-05-15 DIAGNOSIS — I255 Ischemic cardiomyopathy: Secondary | ICD-10-CM | POA: Diagnosis not present

## 2023-05-15 DIAGNOSIS — Z7902 Long term (current) use of antithrombotics/antiplatelets: Secondary | ICD-10-CM

## 2023-05-15 DIAGNOSIS — I214 Non-ST elevation (NSTEMI) myocardial infarction: Principal | ICD-10-CM | POA: Diagnosis present

## 2023-05-15 DIAGNOSIS — I2119 ST elevation (STEMI) myocardial infarction involving other coronary artery of inferior wall: Secondary | ICD-10-CM | POA: Diagnosis not present

## 2023-05-15 DIAGNOSIS — Z7982 Long term (current) use of aspirin: Secondary | ICD-10-CM

## 2023-05-15 DIAGNOSIS — I241 Dressler's syndrome: Secondary | ICD-10-CM | POA: Diagnosis not present

## 2023-05-15 DIAGNOSIS — Z8043 Family history of malignant neoplasm of testis: Secondary | ICD-10-CM

## 2023-05-15 DIAGNOSIS — Z8619 Personal history of other infectious and parasitic diseases: Secondary | ICD-10-CM

## 2023-05-15 DIAGNOSIS — R079 Chest pain, unspecified: Secondary | ICD-10-CM | POA: Diagnosis not present

## 2023-05-15 DIAGNOSIS — I25119 Atherosclerotic heart disease of native coronary artery with unspecified angina pectoris: Secondary | ICD-10-CM | POA: Diagnosis not present

## 2023-05-15 HISTORY — PX: LEFT HEART CATH AND CORONARY ANGIOGRAPHY: CATH118249

## 2023-05-15 HISTORY — PX: CORONARY ULTRASOUND/IVUS: CATH118244

## 2023-05-15 LAB — POCT ACTIVATED CLOTTING TIME
Activated Clotting Time: 279 s
Activated Clotting Time: 354 s

## 2023-05-15 LAB — GLUCOSE, CAPILLARY: Glucose-Capillary: 124 mg/dL — ABNORMAL HIGH (ref 70–99)

## 2023-05-15 LAB — CBC WITH DIFFERENTIAL/PLATELET
Abs Immature Granulocytes: 0.03 10*3/uL (ref 0.00–0.07)
Basophils Absolute: 0.1 10*3/uL (ref 0.0–0.1)
Basophils Relative: 1 %
Eosinophils Absolute: 0.4 10*3/uL (ref 0.0–0.5)
Eosinophils Relative: 4 %
HCT: 43.6 % (ref 39.0–52.0)
Hemoglobin: 14.7 g/dL (ref 13.0–17.0)
Immature Granulocytes: 0 %
Lymphocytes Relative: 43 %
Lymphs Abs: 4.7 10*3/uL — ABNORMAL HIGH (ref 0.7–4.0)
MCH: 30.7 pg (ref 26.0–34.0)
MCHC: 33.7 g/dL (ref 30.0–36.0)
MCV: 91 fL (ref 80.0–100.0)
Monocytes Absolute: 0.9 10*3/uL (ref 0.1–1.0)
Monocytes Relative: 8 %
Neutro Abs: 4.8 10*3/uL (ref 1.7–7.7)
Neutrophils Relative %: 44 %
Platelets: 112 10*3/uL — ABNORMAL LOW (ref 150–400)
RBC: 4.79 MIL/uL (ref 4.22–5.81)
RDW: 12.2 % (ref 11.5–15.5)
WBC: 10.9 10*3/uL — ABNORMAL HIGH (ref 4.0–10.5)
nRBC: 0 % (ref 0.0–0.2)

## 2023-05-15 LAB — TROPONIN I (HIGH SENSITIVITY)
Troponin I (High Sensitivity): 11 ng/L (ref <18)
Troponin I (High Sensitivity): 78 ng/L — ABNORMAL HIGH (ref <18)

## 2023-05-15 LAB — ECHOCARDIOGRAM COMPLETE
AR max vel: 3.53 cm2
AV Area VTI: 3.99 cm2
AV Area mean vel: 3.77 cm2
AV Mean grad: 1 mm[Hg]
AV Peak grad: 3.2 mm[Hg]
Ao pk vel: 0.9 m/s
Area-P 1/2: 4.39 cm2
Calc EF: 50.2 %
Height: 73 in
MV VTI: 4.17 cm2
S' Lateral: 3.8 cm
Single Plane A2C EF: 42.7 %
Single Plane A4C EF: 51.2 %
Weight: 3594.38 [oz_av]

## 2023-05-15 LAB — MAGNESIUM: Magnesium: 2.2 mg/dL (ref 1.7–2.4)

## 2023-05-15 LAB — BRAIN NATRIURETIC PEPTIDE: B Natriuretic Peptide: 27.7 pg/mL (ref 0.0–100.0)

## 2023-05-15 LAB — BASIC METABOLIC PANEL
Anion gap: 10 (ref 5–15)
BUN: 18 mg/dL (ref 8–23)
CO2: 22 mmol/L (ref 22–32)
Calcium: 8.8 mg/dL — ABNORMAL LOW (ref 8.9–10.3)
Chloride: 103 mmol/L (ref 98–111)
Creatinine, Ser: 1.08 mg/dL (ref 0.61–1.24)
GFR, Estimated: >60 mL/min (ref >60)
Glucose, Bld: 134 mg/dL — ABNORMAL HIGH (ref 70–99)
Potassium: 3.8 mmol/L (ref 3.5–5.1)
Sodium: 135 mmol/L (ref 135–145)

## 2023-05-15 LAB — HIV ANTIBODY (ROUTINE TESTING W REFLEX): HIV Screen 4th Generation wRfx: NONREACTIVE

## 2023-05-15 LAB — HEPARIN LEVEL (UNFRACTIONATED): Heparin Unfractionated: >1.1 [IU]/mL — ABNORMAL HIGH (ref 0.30–0.70)

## 2023-05-15 LAB — PROTIME-INR
INR: 1 (ref 0.8–1.2)
Prothrombin Time: 13.5 s (ref 11.4–15.2)

## 2023-05-15 LAB — MRSA NEXT GEN BY PCR, NASAL: MRSA by PCR Next Gen: NOT DETECTED

## 2023-05-15 LAB — APTT: aPTT: 28 s (ref 24–36)

## 2023-05-15 SURGERY — LEFT HEART CATH AND CORONARY ANGIOGRAPHY
Anesthesia: Moderate Sedation

## 2023-05-15 MED ORDER — HEPARIN SODIUM (PORCINE) 1000 UNIT/ML IJ SOLN
INTRAMUSCULAR | Status: AC
  Filled 2023-05-15: qty 10

## 2023-05-15 MED ORDER — CHLORHEXIDINE GLUCONATE CLOTH 2 % EX PADS
6.0000 | MEDICATED_PAD | Freq: Every day | CUTANEOUS | Status: DC
  Administered 2023-05-15 – 2023-05-16 (×2): 6 via TOPICAL

## 2023-05-15 MED ORDER — OMEPRAZOLE MAGNESIUM 20 MG PO TBEC: 20.0000 mg | DELAYED_RELEASE_TABLET | Freq: Every day | ORAL | Status: DC

## 2023-05-15 MED ORDER — ONDANSETRON HCL 4 MG/2ML IJ SOLN
INTRAMUSCULAR | Status: AC
  Filled 2023-05-15: qty 2

## 2023-05-15 MED ORDER — CLOPIDOGREL BISULFATE 75 MG PO TABS: 75.0000 mg | ORAL_TABLET | Freq: Every day | ORAL | Status: DC

## 2023-05-15 MED ORDER — FENTANYL CITRATE (PF) 100 MCG/2ML IJ SOLN
INTRAMUSCULAR | Status: AC
  Filled 2023-05-15: qty 2

## 2023-05-15 MED ORDER — INFLUENZA VAC A&B SURF ANT ADJ 0.5 ML IM SUSY
0.5000 mL | PREFILLED_SYRINGE | INTRAMUSCULAR | Status: DC
  Filled 2023-05-15: qty 0.5

## 2023-05-15 MED ORDER — MIDAZOLAM HCL 2 MG/2ML IJ SOLN
INTRAMUSCULAR | Status: DC | PRN
  Administered 2023-05-15 (×2): 1 mg via INTRAVENOUS

## 2023-05-15 MED ORDER — ONDANSETRON HCL 4 MG/2ML IJ SOLN
4.0000 mg | Freq: Once | INTRAMUSCULAR | Status: AC
  Administered 2023-05-15: 4 mg via INTRAVENOUS
  Filled 2023-05-15: qty 2

## 2023-05-15 MED ORDER — ASPIRIN 81 MG PO CHEW
81.0000 mg | CHEWABLE_TABLET | Freq: Every day | ORAL | Status: DC
  Administered 2023-05-16 – 2023-05-17 (×2): 81 mg via ORAL
  Filled 2023-05-15 (×2): qty 1

## 2023-05-15 MED ORDER — ONDANSETRON HCL 4 MG/2ML IJ SOLN
INTRAMUSCULAR | Status: DC | PRN
  Administered 2023-05-15 (×2): 4 mg via INTRAVENOUS

## 2023-05-15 MED ORDER — EZETIMIBE 10 MG PO TABS
10.0000 mg | ORAL_TABLET | Freq: Every day | ORAL | Status: DC
  Administered 2023-05-16 – 2023-05-17 (×2): 10 mg via ORAL
  Filled 2023-05-15 (×2): qty 1

## 2023-05-15 MED ORDER — BIVALIRUDIN TRIFLUOROACETATE 250 MG IV SOLR
INTRAVENOUS | Status: AC
  Filled 2023-05-15: qty 250

## 2023-05-15 MED ORDER — LIDOCAINE VISCOUS HCL 2 % MT SOLN
15.0000 mL | Freq: Once | OROMUCOSAL | Status: AC
Start: 2023-05-15 — End: 2023-05-15
  Administered 2023-05-15: 15 mL via OROMUCOSAL
  Filled 2023-05-15: qty 15

## 2023-05-15 MED ORDER — HEPARIN SODIUM (PORCINE) 5000 UNIT/ML IJ SOLN: 4000.0000 [IU] | Freq: Once | INTRAMUSCULAR | Status: DC

## 2023-05-15 MED ORDER — IOHEXOL 300 MG/ML  SOLN
INTRAMUSCULAR | Status: DC | PRN
  Administered 2023-05-15: 190 mL

## 2023-05-15 MED ORDER — ESCITALOPRAM OXALATE 10 MG PO TABS
10.0000 mg | ORAL_TABLET | Freq: Every day | ORAL | Status: DC
  Administered 2023-05-16 – 2023-05-17 (×2): 10 mg via ORAL
  Filled 2023-05-15 (×2): qty 1

## 2023-05-15 MED ORDER — NITROGLYCERIN 0.4 MG SL SUBL
0.4000 mg | SUBLINGUAL_TABLET | SUBLINGUAL | Status: DC | PRN
  Filled 2023-05-15: qty 1

## 2023-05-15 MED ORDER — PANTOPRAZOLE SODIUM 40 MG PO TBEC
40.0000 mg | DELAYED_RELEASE_TABLET | Freq: Every day | ORAL | Status: DC
  Administered 2023-05-15 – 2023-05-17 (×3): 40 mg via ORAL
  Filled 2023-05-15 (×3): qty 1

## 2023-05-15 MED ORDER — ALUM & MAG HYDROXIDE-SIMETH 200-200-20 MG/5ML PO SUSP
30.0000 mL | Freq: Once | ORAL | Status: AC
  Administered 2023-05-15: 30 mL via ORAL
  Filled 2023-05-15: qty 30

## 2023-05-15 MED ORDER — METOPROLOL SUCCINATE ER 50 MG PO TB24
50.0000 mg | ORAL_TABLET | Freq: Every day | ORAL | Status: DC
  Administered 2023-05-16 – 2023-05-17 (×2): 50 mg via ORAL
  Filled 2023-05-15 (×2): qty 1

## 2023-05-15 MED ORDER — LACTATED RINGERS IV BOLUS
1000.0000 mL | Freq: Once | INTRAVENOUS | Status: AC
  Administered 2023-05-15: 1000 mL via INTRAVENOUS

## 2023-05-15 MED ORDER — HEPARIN SODIUM (PORCINE) 1000 UNIT/ML IJ SOLN
INTRAMUSCULAR | Status: DC | PRN
  Administered 2023-05-15: 5000 [IU] via INTRAVENOUS
  Administered 2023-05-15: 7000 [IU] via INTRAVENOUS

## 2023-05-15 MED ORDER — SODIUM CHLORIDE 0.9 % WEIGHT BASED INFUSION
1.0000 mL/kg/h | INTRAVENOUS | Status: AC
  Administered 2023-05-15: 1 mL/kg/h via INTRAVENOUS

## 2023-05-15 MED ORDER — FENTANYL CITRATE (PF) 100 MCG/2ML IJ SOLN
INTRAMUSCULAR | Status: DC | PRN
  Administered 2023-05-15 (×2): 25 ug via INTRAVENOUS

## 2023-05-15 MED ORDER — MIDAZOLAM HCL 2 MG/2ML IJ SOLN
INTRAMUSCULAR | Status: AC
Start: 2023-05-15 — End: ?
  Filled 2023-05-15: qty 2

## 2023-05-15 MED ORDER — MORPHINE SULFATE (PF) 4 MG/ML IV SOLN: 4.0000 mg | INTRAVENOUS | Status: DC | PRN

## 2023-05-15 MED ORDER — CICLOPIROX 8 % EX SOLN: Freq: Every day | CUTANEOUS | Status: DC

## 2023-05-15 MED ORDER — LOSARTAN POTASSIUM 25 MG PO TABS
25.0000 mg | ORAL_TABLET | Freq: Every day | ORAL | Status: DC
  Administered 2023-05-16 – 2023-05-17 (×2): 25 mg via ORAL
  Filled 2023-05-15 (×2): qty 1

## 2023-05-15 MED ORDER — LABETALOL HCL 5 MG/ML IV SOLN
10.0000 mg | INTRAVENOUS | Status: AC | PRN
Start: 2023-05-15 — End: 2023-05-15

## 2023-05-15 MED ORDER — CLOPIDOGREL BISULFATE 75 MG PO TABS
ORAL_TABLET | ORAL | Status: DC | PRN
  Administered 2023-05-15: 300 mg via ORAL

## 2023-05-15 MED ORDER — HEPARIN (PORCINE) 25000 UT/250ML-% IV SOLN
1300.0000 [IU]/h | INTRAVENOUS | Status: DC
  Administered 2023-05-15: 1300 [IU]/h via INTRAVENOUS
  Filled 2023-05-15: qty 250

## 2023-05-15 MED ORDER — ORAL CARE MOUTH RINSE: 15.0000 mL | OROMUCOSAL | Status: DC | PRN

## 2023-05-15 MED ORDER — ATORVASTATIN CALCIUM 80 MG PO TABS
80.0000 mg | ORAL_TABLET | Freq: Every day | ORAL | Status: DC
  Administered 2023-05-15 – 2023-05-17 (×3): 80 mg via ORAL
  Filled 2023-05-15: qty 4
  Filled 2023-05-15: qty 1
  Filled 2023-05-15: qty 4

## 2023-05-15 MED ORDER — CLOPIDOGREL BISULFATE 75 MG PO TABS
ORAL_TABLET | ORAL | Status: AC
  Filled 2023-05-15: qty 4

## 2023-05-15 MED ORDER — HEPARIN BOLUS VIA INFUSION
4000.0000 [IU] | Freq: Once | INTRAVENOUS | Status: AC
  Administered 2023-05-15: 4000 [IU] via INTRAVENOUS
  Filled 2023-05-15: qty 4000

## 2023-05-15 MED ORDER — ACETAMINOPHEN 325 MG PO TABS
650.0000 mg | ORAL_TABLET | ORAL | Status: DC | PRN
Start: 2023-05-15 — End: 2023-05-17
  Administered 2023-05-15: 650 mg via ORAL
  Filled 2023-05-15: qty 2

## 2023-05-15 MED ORDER — ONDANSETRON HCL 4 MG/2ML IJ SOLN
4.0000 mg | Freq: Four times a day (QID) | INTRAMUSCULAR | Status: DC | PRN
Start: 2023-05-15 — End: 2023-05-17
  Administered 2023-05-16: 4 mg via INTRAVENOUS
  Filled 2023-05-15: qty 2

## 2023-05-15 MED ORDER — FENTANYL CITRATE PF 50 MCG/ML IJ SOSY
50.0000 ug | PREFILLED_SYRINGE | Freq: Once | INTRAMUSCULAR | Status: AC
  Administered 2023-05-15: 50 ug via INTRAVENOUS
  Filled 2023-05-15: qty 1

## 2023-05-15 MED ORDER — ASPIRIN 81 MG PO TBEC: 81.0000 mg | DELAYED_RELEASE_TABLET | Freq: Every day | ORAL | Status: DC

## 2023-05-15 MED ORDER — CLOPIDOGREL BISULFATE 75 MG PO TABS
75.0000 mg | ORAL_TABLET | Freq: Every day | ORAL | Status: DC
Start: 2023-05-16 — End: 2023-05-17
  Administered 2023-05-16 – 2023-05-17 (×2): 75 mg via ORAL
  Filled 2023-05-15 (×2): qty 1

## 2023-05-15 MED ORDER — MELATONIN 5 MG PO TABS
2.5000 mg | ORAL_TABLET | Freq: Every day | ORAL | Status: DC
  Administered 2023-05-15: 2.5 mg via ORAL
  Filled 2023-05-15: qty 1

## 2023-05-15 MED ORDER — FLUOROURACIL 5 % EX CREA: TOPICAL_CREAM | Freq: Two times a day (BID) | CUTANEOUS | Status: DC

## 2023-05-15 SURGICAL SUPPLY — 20 items
BALLN MINITREK RX 1.5X12 (BALLOONS) ×1
BALLOON MINITREK RX 1.5X12 (BALLOONS) IMPLANT
CATH EAGLE EYE PLAT IMAGING (CATHETERS) IMPLANT
CATH INFINITI JR4 5F (CATHETERS) IMPLANT
CATH VISTA GUIDE 6FR XB3.5 (CATHETERS) IMPLANT
DEVICE CLOSURE MYNXGRIP 6/7F (Vascular Products) IMPLANT
DRAPE BRACHIAL (DRAPES) IMPLANT
KIT ENCORE 26 ADVANTAGE (KITS) IMPLANT
KIT SYRINGE INJ CVI SPIKEX1 (MISCELLANEOUS) IMPLANT
NDL PERC 18GX7CM (NEEDLE) IMPLANT
NEEDLE PERC 18GX7CM (NEEDLE) ×1 IMPLANT
PACK CARDIAC CATH (CUSTOM PROCEDURE TRAY) ×1 IMPLANT
PROTECTION STATION PRESSURIZED (MISCELLANEOUS) ×1
SET ATX-X65L (MISCELLANEOUS) IMPLANT
SHEATH AVANTI 6FR X 11CM (SHEATH) IMPLANT
STATION PROTECTION PRESSURIZED (MISCELLANEOUS) IMPLANT
STENT ONYX FRONTIER 2.0X12 (Permanent Stent) IMPLANT
TUBING CIL FLEX 10 FLL-RA (TUBING) IMPLANT
WIRE G HI TQ BMW 190 (WIRE) IMPLANT
WIRE GUIDERIGHT .035X150 (WIRE) IMPLANT

## 2023-05-15 NOTE — ED Provider Notes (Signed)
[facility] Provider Note    Event Date/Time   First MD Initiated Contact with Patient 05/15/23 [phone]     (approximate)   History   Chest Pain   HPI  Derrick Stone is a 72 y.o. male who presents to the ED for evaluation of Chest Pain   Review of cardiology clinic visit from June.  History of three-vessel CAD PCI to LAD in 2008, circumflex in 2022.  CTO of proximal/mid RCA.  Patient presents to the ED for evaluation of resolving chest discomfort.  Pain began tonight walking to the restroom.  Diaphoretic and nauseous, received ASA prior to arrival and presents to the ED with resolving symptoms and reports feeling much better.  EMS reports he looked "like crap" when they found him at home.   Physical Exam   Triage Vital Signs: ED Triage Vitals  Encounter Vitals Group     BP      Systolic BP Percentile      Diastolic BP Percentile      Pulse      Resp      Temp      Temp src      SpO2      Weight      Height      Head Circumference      Peak Flow      Pain Score      Pain Loc      Pain Education      Exclude from Growth Chart     Most recent vital signs: Vitals:   05/15/23 0530 05/15/23 0601  BP: 118/70 128/70  Pulse: 67 74  Resp: 15 15  Temp:    SpO2: 99% 100%    General: Awake, no distress.  CV:  Good peripheral perfusion.  Resp:  Normal effort.  Abd:  No distention.  MSK:  No deformity noted.  Neuro:  No focal deficits appreciated. Other:     ED Results / Procedures / Treatments   Labs (all labs ordered are listed, but only abnormal results are displayed) Labs Reviewed  BASIC METABOLIC PANEL - Abnormal; Notable for the following components:      Result Value   Glucose, Bld 134 (*)    Calcium 8.8 (*)    All other components within normal limits  CBC WITH DIFFERENTIAL/PLATELET - Abnormal; Notable for the following components:   WBC 10.9 (*)    Platelets 112 (*)    Lymphs Abs 4.7 (*)    All other components within  normal limits  TROPONIN I (HIGH SENSITIVITY) - Abnormal; Notable for the following components:   Troponin I (High Sensitivity) 78 (*)    All other components within normal limits  BRAIN NATRIURETIC PEPTIDE  MAGNESIUM  APTT  PROTIME-INR  TROPONIN I (HIGH SENSITIVITY)    EKG Sinus rhythm with a rate of 58 bpm.  Normal axis and intervals.  1 PVC.  Nonspecific ST changes to inferior leads that are new since comparison.  Concerning for evolving ischemia but no STEMI criteria.  Multiple repeat EKGs with similar nonspecific changes without quite meeting STEMI criteria  RADIOLOGY CXR interpreted by me without evidence of acute cardiopulmonary pathology.  Official radiology report(s): DG Chest Portable 1 View  Result Date: 05/15/2023 CLINICAL DATA:  Chest pain, resolved EXAM: PORTABLE CHEST 1 VIEW COMPARISON:  09/19/2020 FINDINGS: Normal heart size and mediastinal contours. No acute infiltrate or edema. No effusion or pneumothorax. No acute osseous findings. IMPRESSION: Negative low volume chest.  Electronically Signed   By: Tiburcio Pea M.D.   On: 05/15/2023 05:06    PROCEDURES and INTERVENTIONS:  .1-3 Lead EKG Interpretation  Performed by: Delton Prairie, MD Authorized by: Delton Prairie, MD     Interpretation: normal     ECG rate:  66   ECG rate assessment: normal     Rhythm: sinus rhythm     Ectopy: none     Conduction: normal   .Critical Care  Performed by: Delton Prairie, MD Authorized by: Delton Prairie, MD   Critical care provider statement:    Critical care time (minutes):  75   Critical care was time spent personally by me on the following activities:  Development of treatment plan with patient or surrogate, discussions with consultants, evaluation of patient's response to treatment, examination of patient, ordering and review of laboratory studies, ordering and review of radiographic studies, ordering and performing treatments and interventions, pulse oximetry, re-evaluation of  patient's condition and review of old charts   Medications  nitroGLYCERIN (NITROSTAT) SL tablet 0.4 mg (has no administration in time range)  heparin bolus via infusion 4,000 Units (has no administration in time range)  heparin ADULT infusion 100 units/mL (25000 units/252mL) (has no administration in time range)  alum & mag hydroxide-simeth (MAALOX/MYLANTA) 200-200-20 MG/5ML suspension 30 mL (30 mLs Oral Given 05/15/23 0405)  lidocaine (XYLOCAINE) 2 % viscous mouth solution 15 mL (15 mLs Mouth/Throat Given 05/15/23 0405)  fentaNYL (SUBLIMAZE) injection 50 mcg (50 mcg Intravenous Given 05/15/23 0424)  lactated ringers bolus 1,000 mL (0 mLs Intravenous Stopped 05/15/23 0509)     IMPRESSION / MDM / ASSESSMENT AND PLAN / ED COURSE  I reviewed the triage vital signs and the nursing notes.  Differential diagnosis includes, but is not limited to, ACS, PTX, PNA, muscle strain/spasm, PE, dissection, anxiety, pleural effusion  {Patient presents with symptoms of an acute illness or injury that is potentially life-threatening.  72 year old patient with history of CAD presents with an NSTEMI requiring heparinization, medical admission.  He presents with resolving but persistent pain.  EKG with concerning changes but not quite STEMI.  First troponin is normal and second rises mount.  Blood work is otherwise normal, electrolytes, BNP and hemoglobin.  CXR is clear.  Will consult medicine for admission.  Consulted with cardiology who will see this morning and I suspect for a cath  Clinical Course as of 05/15/23 0622  Wed May 15, 2023  0359 Reassessed and discussed plan of care.  He reports some minimal persistent discomfort he describes as indigestion.  We discussed empiric treatment with a GI cocktail and.  Updated nurse, she will get a repeat EKG [DS]  0407 Repeat EKG is similar [DS]  0417 I consult with Dr. Okey Dupre.  We discussed presentation and workup so far.  He recommends obtaining a posterior EKG and  further medication management.  Acknowledges ischemic changes but no STEMI criteria at this point. [DS]  2725 Reassessed. Similar .  Discussed rising troponins, heparin and admission [DS]  0615 I consult with Dr. Corky Sing, requests we keep NPO. Will see this AM [DS]    Clinical Course User Index [DS] Delton Prairie, MD     FINAL CLINICAL IMPRESSION(S) / ED DIAGNOSES   Final diagnoses:  NSTEMI (non-ST elevated myocardial infarction) [facility])     Rx / DC Orders   ED Discharge Orders     None        Note:  This document was prepared using Dragon voice recognition software and  may include unintentional dictation errors.   Delton Prairie, MD 05/15/23 [phone]

## 2023-05-15 NOTE — H&P (Signed)
History and Physical    Derrick Stone:[id number] DOB: [birth year] DOA: 05/15/2023  PCP: Malva Limes, MD (Confirm with patient/family/NH records and if not entered, this has to be entered at [facility] point of entry) Patient coming from: Home  I have personally briefly reviewed patient's old medical records in [facility]  Chief Complaint: Chest pain  HPI: Derrick Stone is a 72 y.o. male with medical history significant of NSTEMI/CAD status post stenting x 3 with most recent stenting in 2022, HTN, HLD, presented with new onset of chest pains.  Symptoms started yesterday morning, patient started to feel epigastric pain, which he attributed to indigestion.  He took some as needed stomach medication and rested in bed the whole afternoon and symptom slightly improved.  He had a normal dinner and went to bed early.  About 130 this morning patient woke up with severe " squeezing-like" chest pain, 6-7/10, nonradiating associated with sweating and palpitations and feeling nausea but no vomiting.  EMS arrived offered nitroglycerin patient however refused and saying " last time nitroglycerin dropped my blood pressure" and still chest pain 4-5/10 after given fentanyl in the ED. Troponin uptrending 11> 78.  EKG was reviewed by STEMI team and considered to be a NSTEMI case and patient was started on heparin drip.  Review of Systems: As per HPI otherwise 14 point review of systems negative.    Past Medical History:  Diagnosis Date   CAD (coronary artery disease)    History of chickenpox    History of mumps    Hyperlipidemia    Hypertension    NSTEMI (non-ST elevated myocardial infarction) (HCC) 09/19/2020    Past Surgical History:  Procedure Laterality Date   CORONARY STENT INTERVENTION N/A 09/20/2020   Procedure: CORONARY STENT INTERVENTION;  Surgeon: Alwyn Pea, MD;  Location: [facility] INVASIVE CV LAB;  Service: Cardiovascular;  Laterality: N/A;   Heart artery stent  2008   x2   HERNIA  REPAIR  2016   LEFT HEART CATH AND CORONARY ANGIOGRAPHY N/A 09/20/2020   Procedure: LEFT HEART CATH AND CORONARY ANGIOGRAPHY and possible PCI and stent;  Surgeon: Alwyn Pea, MD;  Location: [facility] INVASIVE CV LAB;  Service: Cardiovascular;  Laterality: N/A;     reports that he has never smoked. He has never used smokeless tobacco. He reports that he does not drink alcohol and does not use drugs.  Allergies  Allergen Reactions   Gold-Containing Drug Products    Nitroglycerin Other (See Comments)    Passed out when given to him once    Family History  Problem Relation Age of Onset   Melanoma Mother    Diabetes Father    Bone cancer Father    Testicular cancer Brother    Heart disease Paternal Grandfather      Prior to Admission medications   Medication Sig Start Date End Date Taking? Authorizing Provider  aspirin EC 81 MG tablet Take 81 mg by mouth daily. 11/27/06   [provider]  atorvastatin (LIPITOR) 80 MG tablet Take 1 tablet (80 mg total) by mouth daily. 09/22/20   Arnetha Courser, MD  ciclopirox (PENLAC) 8 % solution Apply topically at bedtime. Apply over nail and surrounding skin. Apply daily over previous coat. After seven (7) days, may remove with alcohol and continue cycle. 12/25/22   Candelaria Stagers, DPM  clopidogrel (PLAVIX) 75 MG tablet Take 75 mg by mouth daily.    Callwood, Dwayne D, MD  escitalopram (LEXAPRO) 10 MG  tablet TAKE 1 TABLET (10 MG TOTAL) BY MOUTH ONCE DAILY. 05/27/17   [provider]  ezetimibe (ZETIA) 10 MG tablet Take 1 tablet (10 mg total) by mouth daily. 02/03/23   Malva Limes, MD  fluorouracil (EFUDEX) 5 % cream After Thanksgiving apply the forehead, temples, and crown scalp BID x 7 days. 05/29/22   Deirdre Evener, MD  losartan (COZAAR) 25 MG tablet Take 1 tablet (25 mg total) by mouth daily. 09/21/20   Arnetha Courser, MD  metoprolol succinate (TOPROL-XL) 50 MG 24 hr tablet Take 1 tablet by mouth daily. 11/27/06   [provider]  Omega-3 Fatty Acids (FISH OIL PO) Take 1 capsule by mouth daily.    [provider]  omeprazole (PRILOSEC OTC) 20 MG tablet Take 1 tablet by mouth daily. 11/27/06   [provider]    Physical Exam: Vitals:   05/15/23 0700 05/15/23 0730 05/15/23 0738 05/15/23 0745  BP: 126/72 125/78    Pulse: 72 97  85  Resp: 13 16  14   Temp:   97.9 F (36.6 C)   TempSrc:   Oral   SpO2: 100% 100%  100%  Weight:        Constitutional: NAD, calm, comfortable Vitals:   05/15/23 0700 05/15/23 0730 05/15/23 0738 05/15/23 0745  BP: 126/72 125/78    Pulse: 72 97  85  Resp: 13 16  14   Temp:   97.9 F (36.6 C)   TempSrc:   Oral   SpO2: 100% 100%  100%  Weight:       Eyes: PERRL, lids and conjunctivae normal ENMT: Mucous membranes are moist. Posterior pharynx clear of any exudate or lesions.Normal dentition.  Neck: normal, supple, no masses, no thyromegaly Respiratory: clear to auscultation bilaterally, no wheezing, no crackles. Normal respiratory effort. No accessory muscle use.  Cardiovascular: Regular rate and rhythm, no murmurs / rubs / gallops. No extremity edema. 2+ pedal pulses. No carotid bruits.  Abdomen: no tenderness, no masses palpated. No hepatosplenomegaly. Bowel sounds positive.  Musculoskeletal: no clubbing / cyanosis. No joint deformity upper and lower extremities. Good ROM, no contractures. Normal muscle tone.  Skin: no rashes, lesions, ulcers. No induration Neurologic: CN 2-12 grossly intact. Sensation intact, DTR normal. Strength 5/5 in all 4.  Psychiatric: Normal judgment and insight. Alert and oriented x 3. Normal mood.    Labs on Admission: I have personally reviewed following labs and imaging studies  CBC: Recent Labs  Lab 05/15/23 0302  WBC 10.9*  NEUTROABS 4.8  HGB 14.7  HCT 43.6  MCV 91.0  PLT 112*   Basic Metabolic Panel: Recent Labs  Lab 05/15/23 0302  NA 135  K 3.8  CL 103  CO2 22  GLUCOSE 134*  BUN 18  CREATININE  1.08  CALCIUM 8.8*  MG 2.2   GFR: Estimated Creatinine Clearance: 81.2 mL/min (by C-G formula based on SCr of 1.08 mg/dL). Liver Function Tests: No results for input(s): "AST", "ALT", "ALKPHOS", "BILITOT", "PROT", "ALBUMIN" in the last 168 hours. No results for input(s): "LIPASE", "AMYLASE" in the last 168 hours. No results for input(s): "AMMONIA" in the last 168 hours. Coagulation Profile: Recent Labs  Lab 05/15/23 0302  INR 1.0   Cardiac Enzymes: No results for input(s): "CKTOTAL", "CKMB", "CKMBINDEX", "TROPONINI" in the last 168 hours. BNP (last 3 results) No results for input(s): "PROBNP" in the last 8760 hours. HbA1C: No results for input(s): "HGBA1C" in the last 72 hours. CBG: No results for input(s): "GLUCAP" in  the last 168 hours. Lipid Profile: No results for input(s): "CHOL", "HDL", "LDLCALC", "TRIG", "CHOLHDL", "LDLDIRECT" in the last 72 hours. Thyroid Function Tests: No results for input(s): "TSH", "T4TOTAL", "FREET4", "T3FREE", "THYROIDAB" in the last 72 hours. Anemia Panel: No results for input(s): "VITAMINB12", "FOLATE", "FERRITIN", "TIBC", "IRON", "RETICCTPCT" in the last 72 hours. Urine analysis: No results found for: "COLORURINE", "APPEARANCEUR", "LABSPEC", "PHURINE", "GLUCOSEU", "HGBUR", "BILIRUBINUR", "KETONESUR", "PROTEINUR", "UROBILINOGEN", "NITRITE", "LEUKOCYTESUR"  Radiological Exams on Admission: DG Chest Portable 1 View  Result Date: 05/15/2023 CLINICAL DATA:  Chest pain, resolved EXAM: PORTABLE CHEST 1 VIEW COMPARISON:  09/19/2020 FINDINGS: Normal heart size and mediastinal contours. No acute infiltrate or edema. No effusion or pneumothorax. No acute osseous findings. IMPRESSION: Negative low volume chest. Electronically Signed   By: Tiburcio Pea M.D.   On: 05/15/2023 05:06    EKG: Independently reviewed.  Sinus rhythm, chronic nonspecific ST-T changes on multiple leads  Assessment/Plan Principal Problem:   NSTEMI (non-ST elevated myocardial  infarction) (HCC)  (please populate well all problems here in Problem List. (For example, if patient is on BP meds at home and you resume or decide to hold them, it is a problem that needs to be her. Same for CAD, COPD, HLD and so on)  NSTEMI -Continue aspirin Plavix and statin -Heparin drip -Emergency cath this morning -Echocardiogram -Resume ACEI and beta-blocker once able to  HTN -Continue losartan and metoprolol  HLD -Check lipid panel, continue atorvastatin 80 mg daily    DVT prophylaxis: Heparin drip Code Status: Full code Family Communication: Wife and son at bedside Disposition Plan: Patient is sick with NSTEMI requiring inpatient cardiac cath and ACS treatment, expect more than 2 midnight hospital stay Consults called: [facility] cardiology Admission status: Telemetry admission   Emeline General MD [facility] Pager [phone]  05/15/2023, 8:32 AM

## 2023-05-15 NOTE — Progress Notes (Signed)
ANTICOAGULATION CONSULT NOTE  Pharmacy Consult for heparin infusion Indication: ACS/STEMI  Allergies  Allergen Reactions   Gold-Containing Drug Products    Nitroglycerin Other (See Comments)    Passed out when given to him once    Patient Measurements: Weight: 102.5 kg (226 lb) Heparin Dosing Weight: 101 kg  Vital Signs: Temp: 97.3 F (36.3 C) (10/30 0306) Temp Source: Oral (10/30 0306) BP: 128/70 (10/30 0601) Pulse Rate: 74 (10/30 0601)  Labs: Recent Labs    05/15/23 0302 05/15/23 0509  HGB 14.7  --   HCT 43.6  --   PLT 112*  --   CREATININE 1.08  --   TROPONINIHS 11 78*    Estimated Creatinine Clearance: 81.2 mL/min (by C-G formula based on SCr of 1.08 mg/dL).   Medical History: Past Medical History:  Diagnosis Date   CAD (coronary artery disease)    History of chickenpox    History of mumps    Hyperlipidemia    Hypertension    NSTEMI (non-ST elevated myocardial infarction) (HCC) 09/19/2020    Assessment: Pt is a 72 yo male with hx of MI and stent, presenting to ED c/o onset of CP and sweating, found with elevated Troponin I level trending up.  Goal of Therapy:  Heparin level 0.3-0.7 units/ml Monitor platelets by anticoagulation protocol: Yes   Plan:  Bolus 4000 units x 1 Start heparin infusion at 1300 units/hr Will check HL in 6 hr after start of infusion CBC daily while on heparin  Otelia Sergeant, PharmD, [facility] 05/15/2023 6:12 AM

## 2023-05-15 NOTE — Consult Note (Signed)
[facility] CLINIC CARDIOLOGY CONSULT NOTE       Patient ID: Derrick Stone MRN: [id number] DOB/AGE: 12/04/1950 72 y.o.  Admit date: 05/15/2023 Referring Physician Dr. Deneen Stone Primary Physician Derrick Stone, Derrick Isaacs, MD  Primary Cardiologist Dr. Dorothyann Stone Reason for Consultation chest pain  HPI: Derrick Stone is a 72 y.o. male  with a past medical history of coronary artery disease s/p DES to LAD in 2008 and to mid circumflex 09/2020, hypertension, hyperlipidemia, pre-diabetes, GERD who presented to the ED on 05/15/2023 for chest pain. Cardiology was consulted for further evaluation.   Patient reports he was in his usual state of health until around 2 days ago when he noticed an episode of indigestion.  States that this resolved with rest.  This occurred again yesterday around lunchtime but once again resolved with rest.  At around 1:00 this morning the patient woke up to go to the bathroom he began experiencing sharp chest pain with associated nausea and diaphoresis.  Rates the pain as 10 out of 10.  States that he decided to come to the ED for further evaluation given this.  Workup in the ED notable for creatinine 1.08, potassium 3.8, magnesium 2.2, BNP 27.  Initial troponin 11 uptrending to 78 on most recent check.  EKG with acute ST changes in lateral leads.  At the time of my evaluation this morning the patient is resting comfortably in ED stretcher with wife and son present at bedside.  He states that his chest pain overall is significantly better, rates this as a 2 out of 10.  States that he only received pain medication for his discomfort.  Reports that he previously has not tolerated nitroglycerin, took this once in the past and had significantly low blood pressure and a syncopal episode thus he was not given any for this episode.  Review of systems complete and found to be negative unless listed above    Past Medical History:  Diagnosis Date   CAD (coronary artery disease)     History of chickenpox    History of mumps    Hyperlipidemia    Hypertension    NSTEMI (non-ST elevated myocardial infarction) (HCC) 09/19/2020    Past Surgical History:  Procedure Laterality Date   CORONARY STENT INTERVENTION N/A 09/20/2020   Procedure: CORONARY STENT INTERVENTION;  Surgeon: Derrick Pea, MD;  Location: [facility] INVASIVE CV LAB;  Service: Cardiovascular;  Laterality: N/A;   Heart artery stent  2008   x2   HERNIA REPAIR  2016   LEFT HEART CATH AND CORONARY ANGIOGRAPHY N/A 09/20/2020   Procedure: LEFT HEART CATH AND CORONARY ANGIOGRAPHY and possible PCI and stent;  Surgeon: Derrick Pea, MD;  Location: [facility] INVASIVE CV LAB;  Service: Cardiovascular;  Laterality: N/A;    Medications Prior to Admission  Medication Sig Dispense Refill Last Dose   aspirin EC 81 MG tablet Take 81 mg by mouth daily.      atorvastatin (LIPITOR) 80 MG tablet Take 1 tablet (80 mg total) by mouth daily. 90 tablet 1    ciclopirox (PENLAC) 8 % solution Apply topically at bedtime. Apply over nail and surrounding skin. Apply daily over previous coat. After seven (7) days, may remove with alcohol and continue cycle. 6.6 mL 0    clopidogrel (PLAVIX) 75 MG tablet Take 75 mg by mouth daily.      escitalopram (LEXAPRO) 10 MG tablet TAKE 1 TABLET (10 MG TOTAL) BY MOUTH ONCE DAILY.  1    ezetimibe (  ZETIA) 10 MG tablet Take 1 tablet (10 mg total) by mouth daily. 90 tablet 3    fluorouracil (EFUDEX) 5 % cream After Thanksgiving apply the forehead, temples, and crown scalp BID x 7 days. 30 g 0    losartan (COZAAR) 25 MG tablet Take 1 tablet (25 mg total) by mouth daily. 30 tablet 1    metoprolol succinate (TOPROL-XL) 50 MG 24 hr tablet Take 1 tablet by mouth daily.      Omega-3 Fatty Acids (FISH OIL PO) Take 1 capsule by mouth daily.      omeprazole (PRILOSEC OTC) 20 MG tablet Take 1 tablet by mouth daily.      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  Tobacco Use   Smoking status: Never   Smokeless tobacco: Never  Vaping Use   Vaping status: Never Used  Substance and Sexual Activity   Alcohol use: No   Drug use: No   Sexual activity: Not on file  Other Topics Concern   Not on file  Social History Narrative   Not on file   Social Determinants of Health   Financial Resource Strain: Low Risk  (12/25/2022)   Overall Financial Resource Strain (CARDIA)    Difficulty of Paying Living Expenses: Not hard at all  Food Insecurity: No Food Insecurity (12/25/2022)   Hunger Vital Sign    Worried About Running Out of Food in the Last Year: Never true    Ran Out of Food in the Last Year: Never true  Transportation Needs: No Transportation Needs (12/25/2022)   PRAPARE - Administrator, Civil Service (Medical): No    Lack of Transportation (Non-Medical): No  Physical Activity: Sufficiently Active (12/25/2022)   Exercise Vital Sign    Days of Exercise per Week: 3 days    Minutes of Exercise per Session: 60 min  Stress: No Stress Concern Present (12/25/2022)   Derrick Stone [employer] Occupational [employer] - Occupational Stress Questionnaire    Feeling of Stress : Not at all  Social Connections: Moderately Integrated (12/25/2022)   Social Connection and Isolation Panel [NHANES]    Frequency of Communication with Friends and Family: More than three times a week    Frequency of Social Gatherings with Friends and Family: More than three times a week    Attends Religious Services: Never    Database administrator or Organizations: Yes    Attends Engineer, structural: More than 4 times per year    Marital Status: Married  Catering manager Violence: Not At Risk (12/25/2022)   Humiliation, Afraid, Rape, and Kick questionnaire    Fear of  Current or Ex-Partner: No    Emotionally Abused: No    Physically Abused: No    Sexually Abused: No    Family History  Problem Relation Age of Onset   Melanoma Mother    Diabetes Father    Bone cancer Father    Testicular cancer Brother    Heart disease Paternal Grandfather      Vitals:   05/15/23 0700 05/15/23 0730 05/15/23 0738 05/15/23 0745  BP: 126/72 125/78    Pulse: 72 97  85  Resp: 13 16  14  Temp:   97.9 F (36.6 C)   TempSrc:   Oral   SpO2: 100% 100%  100%  Weight:        PHYSICAL EXAM General: Well-appearing, well nourished, in no acute distress laying nearly flat in ED stretcher. HEENT: Normocephalic and atraumatic. Neck: No JVD.  Lungs: Normal respiratory effort on room air. Clear bilaterally to auscultation. No wheezes, crackles, rhonchi.  Heart: HRRR. Normal S1 and S2 without gallops or murmurs.  Abdomen: Non-distended appearing.  Msk: Normal strength and tone for age. Extremities: Warm and well perfused. No clubbing, cyanosis.  No edema.  Neuro: Alert and oriented X 3. Psych: Answers questions appropriately.   Labs: Basic Metabolic Panel: Recent Labs    05/15/23 0302  NA 135  K 3.8  CL 103  CO2 22  GLUCOSE 134*  BUN 18  CREATININE 1.08  CALCIUM 8.8*  MG 2.2   Liver Function Tests: No results for input(s): "AST", "ALT", "ALKPHOS", "BILITOT", "PROT", "ALBUMIN" in the last 72 hours. No results for input(s): "LIPASE", "AMYLASE" in the last 72 hours. CBC: Recent Labs    05/15/23 0302  WBC 10.9*  NEUTROABS 4.8  HGB 14.7  HCT 43.6  MCV 91.0  PLT 112*   Cardiac Enzymes: Recent Labs    05/15/23 0302 05/15/23 0509  TROPONINIHS 11 78*   BNP: Recent Labs    05/15/23 0302  BNP 27.7   D-Dimer: No results for input(s): "DDIMER" in the last 72 hours. Hemoglobin A1C: No results for input(s): "HGBA1C" in the last 72 hours. Fasting Lipid Panel: No results for input(s): "CHOL", "HDL", "LDLCALC", "TRIG", "CHOLHDL", "LDLDIRECT" in the last  72 hours. Thyroid Function Tests: No results for input(s): "TSH", "T4TOTAL", "T3FREE", "THYROIDAB" in the last 72 hours.  Invalid input(s): "FREET3" Anemia Panel: No results for input(s): "VITAMINB12", "FOLATE", "FERRITIN", "TIBC", "IRON", "RETICCTPCT" in the last 72 hours.   Radiology: DG Chest Portable 1 View  Result Date: 05/15/2023 CLINICAL DATA:  Chest pain, resolved EXAM: PORTABLE CHEST 1 VIEW COMPARISON:  09/19/2020 FINDINGS: Normal heart size and mediastinal contours. No acute infiltrate or edema. No effusion or pneumothorax. No acute osseous findings. IMPRESSION: Negative low volume chest. Electronically Signed   By: Tiburcio Stone M.D.   On: 05/15/2023 05:06    ECHO pending  TELEMETRY reviewed by me 05/15/2023: Sinus rhythm 80s  EKG reviewed by me: Normal sinus rhythm with elevation in leads I, aVL with reciprocal changes  Data reviewed by me 05/15/2023: last 24h vitals tele labs imaging I/O ED provider note, admission H&P  Principal Problem:   NSTEMI (non-ST elevated myocardial infarction) (HCC)    ASSESSMENT AND PLAN:  Derrick Stone is a 72 y.o. male  with a past medical history of coronary artery disease s/p DES to LAD in 2008 and to mid circumflex 09/2020, hypertension, hyperlipidemia, pre-diabetes, GERD who presented to the ED on 05/15/2023 for chest pain. Cardiology was consulted for further evaluation.   #Threatened STEMI #Coronary artery disease s/p DES to LAD (2008), LCx (2022) With episodes of indigestion over the last 2 days.  Sudden onset chest pain 1 AM today with associated nausea and diaphoresis.  Troponins trended 11 > 78.  EKG with acute ST-T changes. -Trend troponins -Patient will be taken urgently to Cath Lab with Dr. Juliann Pares. -Heparin infusion. -Will defer nitroglycerin due to patient's history of reaction with hypotension and syncope. -Discussed the risks and benefits of proceeding with LHC for further evaluation with the patient.  He is agreeable to  proceed.  NPO until LHC (10/30) with Dr. Juliann Pares.  Written consent will be obtained.  Further recommendations following LHC.    #Hypertension #Hyperlipidemia -Will plan to continue home atorvastatin 80 mg daily, Zetia 10 mg daily, aspirin 81 mg daily.  DAPT recommendations pending LHC. -Plan to continue home metoprolol succinate 50 mg daily, losartan 25 mg daily.  This patient's plan of care was discussed and created with Dr. Corky Sing and he is in agreement.  Signed: Gale Journey, PA-C  05/15/2023, 8:19 AM [facility] Cardiology

## 2023-05-15 NOTE — Progress Notes (Signed)
   05/15/23 1000  Spiritual Encounters  Type of Visit Follow up  Care provided to: Pt and family  OnCall Visit Yes   Chaplain provided follow-up care after Code STEMI for patient and family.

## 2023-05-15 NOTE — ED Notes (Signed)
Cardiologist [place] at bedside

## 2023-05-15 NOTE — ED Triage Notes (Addendum)
Pt arrives EMS with chest pain that  began tonight just prior to arrival after going to the bathroom. Pt reports sweating at that time. Pt has hx of MI and stent placement. Pt reports mild chest pain currently, denies other symptoms. Pt received 324 ASA prior to arrival.

## 2023-05-15 NOTE — CV Procedure (Signed)
Brief post cath note  Asked to evaluate patient by general cardiology team there was concern about borderline questionable EKG patient was brought in late last night with borderline STEMI EKG he was treated medically for non-STEMI patient was having continued pain so the cardiology team asked to have the patient evaluated for possible emergent cath.  After reviewing EKGs and evaluate the patient who is still having ongoing pain we elected to bring the patient to the Cath Lab for urgent cath for evaluation of possible questionable STEMI  Cardiac cath showed mild reduced left ventricular function 45 to 50% with anterolateral hypokinesis mild LVE EF around 45 to 50%  Coronaries Left main with mild to moderate disease 25 to 50% ostial LAD large widely patent proximal to mid stent Diagonal 1 medium sized occluded proximally jailed by LAD stent TIMI 0 flow Circumflex large widely patent mid stent RCA 100% occluded proximally CTO extensive collaterals left to right  PCI Successful PCI of diagonal with balloon angioplasty 1.5 x 12 mm up to 14 atm Lesion reduced from 0 down to 25 TIMI-3 flow restored from TIMI 0  Unable to deploy stent across the lesion  IVUS of the left main suggested area of 6.3 so further intervention was deferred for the left main treated medically  Patient maintained on aspirin Plavix Mynx deployed to the right femoral artery Patient transferred to ICU for recovery  Patient tolerated procedure well No complications  Conclusion Successful PCI of medium size diagonal 100% occlusion proximally reduced to 25% with balloon angioplasty TIMI-3 flow restored from TIMI 0

## 2023-05-15 NOTE — Progress Notes (Signed)
*  PRELIMINARY RESULTS* Echocardiogram 2D Echocardiogram has been performed.  Carolyne Fiscal 05/15/2023, 3:10 PM

## 2023-05-15 NOTE — Significant Event (Signed)
[facility] STEMI Evaluation Note  Date: 05/15/23  Time: 4:22 AM  I was contacted by Dr. Katrinka Blazing to review Mr. Derrick Stone EKG for possible STEMI.  The patent presented this morning with sudden onset of chest pain and diaphoresis.  Thus far, he has received GI cocktail without relief and has refused nitroglycerin because it has precipitated hypotension in the past.  EKG's show normal sinus rhythm with anterolateral and inferior ST depressions and less than 1 mm ST elevation in 1 and aVL on initial EKG that has improved on repeat tracing.  EKG does not meet STEMI criteria.  However, given that most recent PCI in 2022 was to LCx, I recommend performing right-sided and/or posterior EKG to evaluate for posterior STEMI.  Emergent cardiac catheterization is not indicated at this time.  I recommend encouraging the patient to try low-dose IV nitroglycerin, which would be less prone to causing hypotension.  IV opioid could also be considered for pain control.  If pain cannot be controlled with medical therapy and/or serial EKG's show developing ST elevation, please contact me or the on-call STEMI doctor for re-evaluation.  Consultation with Dr. Juliann Pares (the patient's primary cardiologist) or the covering provider for [facility] Cardiology is also recommended.  In the meantime, administration of aspirin (if not already taken at home) and IV heparin should be considered.  These findings and recommendations were discussed with Dr. Katrinka Blazing by phone.  Yvonne Kendall, MD [facility]

## 2023-05-15 NOTE — Progress Notes (Signed)
   05/15/23 0800  Spiritual Encounters  Type of Visit Initial  Care provided to: Family  Referral source Code page  Reason for visit Code  OnCall Visit Yes  Interventions  Spiritual Care Interventions Made Compassionate presence;Prayer   Chaplain responded to Code STEMI. Patient in Cath Lab and family encountered in waiting area. Chaplain provided comfort through compassionate presence and prayer.

## 2023-05-16 ENCOUNTER — Encounter: Payer: Self-pay | Admitting: Internal Medicine

## 2023-05-16 DIAGNOSIS — I214 Non-ST elevation (NSTEMI) myocardial infarction: Secondary | ICD-10-CM

## 2023-05-16 LAB — CBC
HCT: 38.4 % — ABNORMAL LOW (ref 39.0–52.0)
Hemoglobin: 13.4 g/dL (ref 13.0–17.0)
MCH: 30.9 pg (ref 26.0–34.0)
MCHC: 34.9 g/dL (ref 30.0–36.0)
MCV: 88.7 fL (ref 80.0–100.0)
Platelets: 118 10*3/uL — ABNORMAL LOW (ref 150–400)
RBC: 4.33 MIL/uL (ref 4.22–5.81)
RDW: 12.6 % (ref 11.5–15.5)
WBC: 11.4 10*3/uL — ABNORMAL HIGH (ref 4.0–10.5)
nRBC: 0 % (ref 0.0–0.2)

## 2023-05-16 LAB — BASIC METABOLIC PANEL
Anion gap: 6 (ref 5–15)
BUN: 14 mg/dL (ref 8–23)
CO2: 25 mmol/L (ref 22–32)
Calcium: 8.6 mg/dL — ABNORMAL LOW (ref 8.9–10.3)
Chloride: 105 mmol/L (ref 98–111)
Creatinine, Ser: 0.97 mg/dL (ref 0.61–1.24)
GFR, Estimated: >60 mL/min (ref >60)
Glucose, Bld: 118 mg/dL — ABNORMAL HIGH (ref 70–99)
Potassium: 3.9 mmol/L (ref 3.5–5.1)
Sodium: 136 mmol/L (ref 135–145)

## 2023-05-16 LAB — TROPONIN I (HIGH SENSITIVITY)
Troponin I (High Sensitivity): 16158 ng/L (ref <18)
Troponin I (High Sensitivity): 21950 ng/L (ref <18)

## 2023-05-16 MED ORDER — MELATONIN 5 MG PO TABS
5.0000 mg | ORAL_TABLET | Freq: Every day | ORAL | Status: DC
  Administered 2023-05-16: 5 mg via ORAL
  Filled 2023-05-16: qty 1

## 2023-05-16 MED ORDER — HEPARIN (PORCINE) IN NACL 1000-0.9 UT/500ML-% IV SOLN
INTRAVENOUS | Status: DC | PRN
  Administered 2023-05-15: 1000 mL

## 2023-05-16 NOTE — Plan of Care (Signed)
  Problem: Education: Goal: Understanding of cardiac disease, CV risk reduction, and recovery process will improve Outcome: Progressing   Problem: Activity: Goal: Ability to tolerate increased activity will improve Outcome: Progressing   Problem: Cardiac: Goal: Ability to achieve and maintain adequate cardiovascular perfusion will improve Outcome: Progressing   Problem: Health Behavior/Discharge Planning: Goal: Ability to safely manage health-related needs after discharge will improve Outcome: Progressing   Problem: Education: Goal: Knowledge of General Education information will improve Description: Including pain rating scale, medication(s)/side effects and non-pharmacologic comfort measures Outcome: Progressing   Problem: Health Behavior/Discharge Planning: Goal: Ability to manage health-related needs will improve Outcome: Progressing   Problem: Clinical Measurements: Goal: Diagnostic test results will improve Outcome: Progressing Goal: Cardiovascular complication will be avoided Outcome: Progressing   Problem: Activity: Goal: Risk for activity intolerance will decrease Outcome: Progressing   Problem: Pain Management: Goal: General experience of comfort will improve Outcome: Progressing   Problem: Safety: Goal: Ability to remain free from injury will improve Outcome: Progressing   Problem: Skin Integrity: Goal: Risk for impaired skin integrity will decrease Outcome: Progressing   Problem: Education: Goal: Understanding of CV disease, CV risk reduction, and recovery process will improve Outcome: Progressing   Problem: Activity: Goal: Ability to return to baseline activity level will improve Outcome: Progressing   Problem: Cardiovascular: Goal: Ability to achieve and maintain adequate cardiovascular perfusion will improve Outcome: Progressing Goal: Vascular access site(s) Level 0-1 will be maintained Outcome: Progressing

## 2023-05-16 NOTE — Hospital Course (Signed)
HPI:  Derrick Stone is a 72 y.o. male with medical history significant of NSTEMI/CAD status post stenting x 3 with most recent stenting in 2022, HTN, HLD, presented with new onset of chest pains.   Hospital course / significant events:  10/30: to ED. Heparin gtt, underwent cardiac cath w/ balloon angioplasty but unable to deploy stent. Medical mgt w/ ASA/Plavix recommended 10/31: remains stable, per cardio if still doing well tomorrow can anticipate d/c home   Consultants:  Cardiology  Procedures/Surgeries: 10/30 cardiac catheterization w/ occluded first diagonal --> PCI balloon angioplasty successful      ASSESSMENT & PLAN:   Threatened STEMI Coronary artery disease s/p DES to LAD (2008), LCx (2022) Ischemic cardiomyopathy S/p PCI with balloon angioplasty to D1.  Echo this admission with EF 45-50%.  DAPT with aspirin and Plavix  metoprolol and losartan High potency statin    Hypertension Hyperlipidemia BP remained well controlled throughout admission.  Most recent Lipid panel with TC 151 and LDL 87. Continue atorvastatin 80 mg daily and zetia 10 mg daily.  Continue home metoprolol succinate 50 mg daily, losartan 25 mg daily.    overweight based on BMI: Body mass index is 29.64 kg/m.  Underweight - under 18.5  normal weight - 18.5 to 24.9 overweight - 25 to 29.9 obese - 30 or more   DVT prophylaxis: ambulation  IV fluids: no continuous IV fluids  Nutrition: cardiac Central lines / invasive devices: none  Code Status: FULL CODE ACP documentation reviewed: none on file in [facility]  TOC needs: none Barriers to dispo / significant pending items: cardiac clearance priot to d/c

## 2023-05-16 NOTE — Progress Notes (Signed)
[name]INIC CARDIOLOGY PROGRESS NOTE       Patient ID: Derrick Stone MRN: [id number] DOB/AGE: 07-27-1950 72 y.o.  Admit date: 05/15/2023 Referring Physician Dr. Deneen Harts Primary Physician Sherrie Mustache, Demetrios Isaacs, MD  Primary Cardiologist Dr. Dorothyann Peng Reason for Consultation chest pain  HPI: Derrick Stone is a 71 y.o. male  with a past medical history of coronary artery disease s/p DES to LAD in 2008 and to mid circumflex 09/2020, hypertension, hyperlipidemia, pre-diabetes, GERD who presented to the ED on 05/15/2023 for chest pain. Cardiology was consulted for further evaluation.   Interval history: -Patient reports he feels well. Eager to get out of bed. -Denies CP, SOB. HR and BP remain stable on tele.  -Troponins peaked at 21,000 and downtrending.  Review of systems complete and found to be negative unless listed above    Past Medical History:  Diagnosis Date   CAD (coronary artery disease)    History of chickenpox    History of mumps    Hyperlipidemia    Hypertension    NSTEMI (non-ST elevated myocardial infarction) (HCC) 09/19/2020    Past Surgical History:  Procedure Laterality Date   CORONARY STENT INTERVENTION N/A 09/20/2020   Procedure: CORONARY STENT INTERVENTION;  Surgeon: Alwyn Pea, MD;  Location: [facility] INVASIVE CV LAB;  Service: Cardiovascular;  Laterality: N/A;   CORONARY ULTRASOUND/IVUS N/A 05/15/2023   Procedure: Coronary Ultrasound/IVUS;  Surgeon: Alwyn Pea, MD;  Location: [facility] INVASIVE CV LAB;  Service: Cardiovascular;  Laterality: N/A;   Heart artery stent  2008   x2   HERNIA REPAIR  2016   LEFT HEART CATH AND CORONARY ANGIOGRAPHY N/A 09/20/2020   Procedure: LEFT HEART CATH AND CORONARY ANGIOGRAPHY and possible PCI and stent;  Surgeon: Alwyn Pea, MD;  Location: [facility] INVASIVE CV LAB;  Service: Cardiovascular;  Laterality: N/A;   LEFT HEART CATH AND CORONARY ANGIOGRAPHY N/A 05/15/2023   Procedure: LEFT HEART CATH AND CORONARY  ANGIOGRAPHY;  Surgeon: Alwyn Pea, MD;  Location: [facility] INVASIVE CV LAB;  Service: Cardiovascular;  Laterality: N/A;    Medications Prior to Admission  Medication Sig Dispense Refill Last Dose   aspirin EC 81 MG tablet Take 81 mg by mouth daily.      atorvastatin (LIPITOR) 80 MG tablet Take 1 tablet (80 mg total) by mouth daily. 90 tablet 1    ciclopirox (PENLAC) 8 % solution Apply topically at bedtime. Apply over nail and surrounding skin. Apply daily over previous coat. After seven (7) days, may remove with alcohol and continue cycle. 6.6 mL 0    clopidogrel (PLAVIX) 75 MG tablet Take 75 mg by mouth daily.      escitalopram (LEXAPRO) 10 MG tablet TAKE 1 TABLET (10 MG TOTAL) BY MOUTH ONCE DAILY.  1    ezetimibe (ZETIA) 10 MG tablet Take 1 tablet (10 mg total) by mouth daily. 90 tablet 3    fluorouracil (EFUDEX) 5 % cream After Thanksgiving apply the forehead, temples, and crown scalp BID x 7 days. 30 g 0    losartan (COZAAR) 25 MG tablet Take 1 tablet (25 mg total) by mouth daily. 30 tablet 1    metoprolol succinate (TOPROL-XL) 50 MG 24 hr tablet Take 1 tablet by mouth daily.      Omega-3 Fatty Acids (FISH OIL PO) Take 1 capsule by mouth daily.      omeprazole (PRILOSEC OTC) 20 MG tablet Take 1 tablet by mouth daily.      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  Tobacco Use   Smoking status: Never   Smokeless tobacco: Never  Vaping Use   Vaping status: Never Used  Substance and Sexual Activity   Alcohol use: No   Drug use: No   Sexual activity: Not on file  Other Topics Concern   Not on file  Social History Narrative   Not on file   Social Determinants of Health   Financial Resource Strain: Low Risk  (12/25/2022)    Overall Financial Resource Strain (CARDIA)    Difficulty of Paying Living Expenses: Not hard at all  Food Insecurity: No Food Insecurity (05/15/2023)   Hunger Vital Sign    Worried About Running Out of Food in the Last Year: Never true    Ran Out of Food in the Last Year: Never true  Transportation Needs: No Transportation Needs (05/15/2023)   PRAPARE - Administrator, Civil Service (Medical): No    Lack of Transportation (Non-Medical): No  Physical Activity: Sufficiently Active (12/25/2022)   Exercise Vital Sign    Days of Exercise per Week: 3 days    Minutes of Exercise per Session: 60 min  Stress: No Stress Concern Present (12/25/2022)   [employer] of Occupational Health - Occupational Stress Questionnaire    Feeling of Stress : Not at all  Social Connections: Moderately Integrated (12/25/2022)   Social Connection and Isolation Panel [NHANES]    Frequency of Communication with Friends and Family: More than three times a week    Frequency of Social Gatherings with Friends and Family: More than three times a week    Attends Religious Services: Never    Database administrator or Organizations: Yes    Attends Engineer, structural: More than 4 times per year    Marital Status: Married  Catering manager Violence: Not At Risk (05/15/2023)   Humiliation, Afraid, Rape, and Kick questionnaire    Fear of Current or Ex-Partner: No    Emotionally Abused: No    Physically Abused: No    Sexually Abused: No    Family History  Problem Relation Age of Onset   Melanoma Mother    Diabetes Father    Bone cancer Father    Testicular cancer Brother    Heart disease Paternal Grandfather      Vitals:   05/16/23 0400 05/16/23 0600 05/16/23 0700 05/16/23 0800  BP: 119/78 112/81 122/77 107/69  Pulse: 76 80 74 70  Resp:  15 12 11   Temp: 98.5 F (36.9 C)   98.1 F (36.7 C)  TempSrc: Oral   Oral  SpO2: 96% 95% 96% 97%  Weight:      Height:        PHYSICAL  EXAM General: Well-appearing, well nourished, in no acute distress laying at a slight incline in hospital bed. HEENT: Normocephalic and atraumatic. Neck: No JVD.  Lungs: Normal respiratory effort on room air. Clear bilaterally to auscultation. No wheezes, crackles, rhonchi.  Heart: HRRR. Normal S1 and S2 without gallops or murmurs.  Abdomen: Non-distended appearing.  Msk: Normal strength and tone for age. Extremities: Warm and well perfused. No clubbing, cyanosis.  No edema.  Neuro: Alert and oriented X 3.  Psych: Answers questions appropriately.   Labs: Basic Metabolic Panel: Recent Labs    05/15/23 0302 05/16/23 0437  NA 135 136  K 3.8 3.9  CL 103 105  CO2 22 25  GLUCOSE 134* 118*  BUN 18 14  CREATININE 1.08 0.97  CALCIUM 8.8* 8.6*  MG 2.2  --    Liver Function Tests: No results for input(s): "AST", "ALT", "ALKPHOS", "BILITOT", "PROT", "ALBUMIN" in the last 72 hours. No results for input(s): "LIPASE", "AMYLASE" in the last 72 hours. CBC: Recent Labs    05/15/23 0302 05/16/23 0437  WBC 10.9* 11.4*  NEUTROABS 4.8  --   HGB 14.7 13.4  HCT 43.6 38.4*  MCV 91.0 88.7  PLT 112* 118*   Cardiac Enzymes: Recent Labs    05/15/23 0509 05/16/23 0004 05/16/23 0437  TROPONINIHS 78* 21,950* 16,158*   BNP: Recent Labs    05/15/23 0302  BNP 27.7   D-Dimer: No results for input(s): "DDIMER" in the last 72 hours. Hemoglobin A1C: No results for input(s): "HGBA1C" in the last 72 hours. Fasting Lipid Panel: No results for input(s): "CHOL", "HDL", "LDLCALC", "TRIG", "CHOLHDL", "LDLDIRECT" in the last 72 hours. Thyroid Function Tests: No results for input(s): "TSH", "T4TOTAL", "T3FREE", "THYROIDAB" in the last 72 hours.  Invalid input(s): "FREET3" Anemia Panel: No results for input(s): "VITAMINB12", "FOLATE", "FERRITIN", "TIBC", "IRON", "RETICCTPCT" in the last 72 hours.   Radiology: ECHOCARDIOGRAM COMPLETE  Result Date: 05/15/2023    ECHOCARDIOGRAM REPORT   Patient  Name:   ELIJUAH YECK Date of Exam: 05/15/2023 Medical Rec #:  [id number]     Height:       73.0 in Accession #:    [id number]    Weight:       224.6 lb Date of Birth:  [birth year]    BSA:          2.261 m Patient Age:    71 years      BP:           125/71 mmHg Patient Gender: M             HR:           93 bpm. Exam Location:  [facility] Procedure: 2D Echo, Cardiac Doppler, Color Doppler, 3D Echo and Strain Analysis Indications:     NSTEMI  History:         Patient has prior history of Echocardiogram examinations, most                  recent 09/21/2020. Acute MI, Previous Myocardial Infarction and                  CAD; Risk Factors:Hypertension and Dyslipidemia.  Sonographer:     Mikki Harbor Referring Phys:  [id number] Shaina Gullatt Diagnosing Phys: Windell Norfolk  Sonographer Comments: Image acquisition challenging due to respiratory motion. Global longitudinal strain was attempted. IMPRESSIONS  1. Left ventricular ejection fraction by 2D MOD biplane is 50.2 %. The left ventricle has mildly decreased function. The left ventricle demonstrates regional wall motion abnormalities (see scoring diagram/findings for description). There is mild left ventricular hypertrophy. Left ventricular diastolic parameters are consistent with Grade I diastolic dysfunction (impaired relaxation).  2. Right ventricular systolic function is normal. The right ventricular size is normal.  3. The mitral valve is normal in structure. Trivial mitral valve regurgitation.  4. The aortic valve is tricuspid. Aortic valve regurgitation is not visualized.  5. The inferior vena cava is normal in size with greater than  50% respiratory variability, suggesting right atrial pressure of 3 mmHg. FINDINGS  Left Ventricle: Left ventricular ejection fraction by 2D MOD biplane is 50.2 %. The left ventricle has mildly decreased function. The left ventricle demonstrates regional wall motion abnormalities. The left ventricular internal cavity size was normal in   size. There is mild left ventricular hypertrophy. Left ventricular diastolic parameters are consistent with Grade I diastolic dysfunction (impaired relaxation).  LV Wall Scoring: The mid and distal anterior wall, apical lateral segment, and mid anterolateral segment are hypokinetic. The entire septum, entire inferior wall, posterior wall, basal anterolateral segment, basal anterior segment, and apex are normal. Right Ventricle: The right ventricular size is normal. No increase in right ventricular wall thickness. Right ventricular systolic function is normal. Left Atrium: Left atrial size was normal in size. Right Atrium: Right atrial size was normal in size. Pericardium: There is no evidence of pericardial effusion. Mitral Valve: The mitral valve is normal in structure. Trivial mitral valve regurgitation. MV peak gradient, 4.2 mmHg. The mean mitral valve gradient is 1.0 mmHg. Tricuspid Valve: The tricuspid valve is normal in structure. Tricuspid valve regurgitation is trivial. Aortic Valve: The aortic valve is tricuspid. Aortic valve regurgitation is not visualized. Aortic valve mean gradient measures 1.0 mmHg. Aortic valve peak gradient measures 3.2 mmHg. Aortic valve area, by VTI measures 3.99 cm. Pulmonic Valve: The pulmonic valve was not well visualized. Pulmonic valve regurgitation is trivial. Aorta: The aortic root and ascending aorta are structurally normal, with no evidence of dilitation. Venous: The inferior vena cava is normal in size with greater than 50% respiratory variability, suggesting right atrial pressure of 3 mmHg. IAS/Shunts: The interatrial septum was not assessed.  LEFT VENTRICLE PLAX 2D                        Biplane EF (MOD) LVIDd:         5.60 cm         LV Biplane EF:   Left LVIDs:         3.80 cm                          ventricular LV PW:         1.20 cm                          ejection LV IVS:        1.30 cm                          fraction by LVOT diam:     2.20 cm                           2D MOD LV SV:         72                               biplane is LV SV Index:   32                               50.2 %. LVOT Area:     3.80 cm  Diastology                                LV e' medial:    6.09 cm/s LV Volumes (MOD)               LV E/e' medial:  9.4 LV vol d, MOD    76.3 ml       LV e' lateral:   5.44 cm/s A2C:                           LV E/e' lateral: 10.5 LV vol d, MOD    83.0 ml A4C: LV vol s, MOD    43.7 ml A2C: LV vol s, MOD    40.5 ml A4C: LV SV MOD A2C:   32.6 ml LV SV MOD A4C:   83.0 ml LV SV MOD BP:    42.7 ml RIGHT VENTRICLE RV Basal diam:  3.95 cm RV Mid diam:    4.00 cm RV S prime:     20.30 cm/s LEFT ATRIUM             Index        RIGHT ATRIUM           Index LA diam:        4.00 cm 1.77 cm/m   RA Area:     14.40 cm LA Vol (A2C):   32.2 ml 14.24 ml/m  RA Volume:   37.00 ml  16.37 ml/m LA Vol (A4C):   36.5 ml 16.15 ml/m LA Biplane Vol: 33.2 ml 14.69 ml/m  AORTIC VALVE                    PULMONIC VALVE AV Area (Vmax):    3.53 cm     PV Vmax:       0.93 m/s AV Area (Vmean):   3.77 cm     PV Peak grad:  3.5 mmHg AV Area (VTI):     3.99 cm AV Vmax:           89.80 cm/s AV Vmean:          53.000 cm/s AV VTI:            0.181 m AV Peak Grad:      3.2 mmHg AV Mean Grad:      1.0 mmHg LVOT Vmax:         83.40 cm/s LVOT Vmean:        52.600 cm/s LVOT VTI:          0.190 m LVOT/AV VTI ratio: 1.05  AORTA Ao Root diam: 3.70 cm Ao Asc diam:  3.40 cm MITRAL VALVE MV Area (PHT): 4.39 cm    SHUNTS MV Area VTI:   4.17 cm    Systemic VTI:  0.19 m MV Peak grad:  4.2 mmHg    Systemic Diam: 2.20 cm MV Mean grad:  1.0 mmHg MV Vmax:       1.03 m/s MV Vmean:      54.2 cm/s MV Decel Time: 173 msec MV E velocity: 57.30 cm/s MV A velocity: 92.60 cm/s MV E/A ratio:  0.62 Windell Norfolk Electronically signed by Windell Norfolk Signature Date/Time: 05/15/2023/4:47:13 PM    Final    CARDIAC CATHETERIZATION  Result Date: 05/15/2023   Prox LAD to Mid LAD lesion is  10% stenosed.   Dist LAD lesion  is 25% stenosed.   Prox RCA to Mid RCA lesion is 100% stenosed.   1st Diag-1 lesion is 50% stenosed.   1st Diag-2 lesion is 100% stenosed.   Non-stenotic Mid Cx lesion was previously treated.   Balloon angioplasty was performed using a BALLN MINITREK RX 1.5X12.   Post intervention, there is a 25% residual stenosis.   There is mild left ventricular systolic dysfunction.   LV end diastolic pressure is mildly elevated.   The left ventricular ejection fraction is 45-50% by visual estimate.   Anticipated discharge date to be determined.   Recommend uninterrupted dual antiplatelet therapy with Aspirin 81mg  daily and Clopidogrel 75mg  daily for a minimum of 12 months (ACS-Class I recommendation). Conclusion Urgent diagnostic cath with abnormal EKG concerning for possible STEMI.  Patient had slight ST elevation in 1 and now reciprocal depression in 2 3 and F concerning for lateral STEMI/acute coronary syndrome Patient was seen by me you still having chest pain so we elected to bring the patient to the cardiac Cath Lab Right groin approach Left ventriculogram Mildly reduced left ventricular function EF around 45 to 50% with anterior lateral hypokinesis Left ventriculogram Coronaries Left main large 25 to 50% ostial lesion TIMI-3 flow LAD large widely patent proximal to mid stent minor irregularities Diagonal 1 moderate in size 100% occluded proximally IRA TIMI 0 flow jailed by the LAD stent IRA Circumflex large widely patent mid stent only minor irregularities RCA 100% occluded proximally CTO moderate collaterals left to right Intervention Successful PCI of diagonal 1 balloon 1.5 x 12 mm to 14 atm Lesion reduced from 100 down to 25% TIMI 0 to TIMI-3 flow Left main evaluated by IVUS found to have area of 6.3 which was thought to be nonsignificant Unable to successfully deploy stent Patient placed on aspirin Plavix long-term Have the patient follow-up with cardiology as an outpatient  DG Chest  Portable 1 View  Result Date: 05/15/2023 CLINICAL DATA:  Chest pain, resolved EXAM: PORTABLE CHEST 1 VIEW COMPARISON:  09/19/2020 FINDINGS: Normal heart size and mediastinal contours. No acute infiltrate or edema. No effusion or pneumothorax. No acute osseous findings. IMPRESSION: Negative low volume chest. Electronically Signed   By: Tiburcio Pea M.D.   On: 05/15/2023 05:06    ECHO as above  TELEMETRY reviewed by me 05/16/2023: sinus rhythm rate 70s  EKG reviewed by me: Normal sinus rhythm with elevation in leads I, aVL with reciprocal changes  Data reviewed by me 05/16/2023: last 24h vitals tele labs imaging I/O hospitalist progress note, CV procedure note  Principal Problem:   NSTEMI (non-ST elevated myocardial infarction) (HCC)    ASSESSMENT AND PLAN:  SATISH GONSALEZ is a 72 y.o. male  with a past medical history of coronary artery disease s/p DES to LAD in 2008 and to mid circumflex 09/2020, hypertension, hyperlipidemia, pre-diabetes, GERD who presented to the ED on 05/15/2023 for chest pain. Cardiology was consulted for further evaluation.   #Threatened STEMI #Coronary artery disease s/p DES to LAD (2008), LCx (2022) #Ischemic cardiomyopathy With episodes of indigestion over the last 2 days.  Sudden onset chest pain 1 AM today with associated nausea and diaphoresis.  Troponins trended 11 > 78 > 21000 > 16000.  EKG with acute ST-T changes. S/p PCI with balloon angioplasty to D1. Echo this admission with EF 45-50%.  -Plan for DAPT with aspirin and Plavix  -Transfer out of ICU today to med/tele.  -Recommend OOB today.  -Continue metoprolol and losartan as below.   #  Hypertension #Hyperlipidemia BP remained well controlled throughout admission. Most recent Lipid panel with TC 151 and LDL 87. -Continue atorvastatin 80 mg daily and zetia 10 mg daily.  -Continue home metoprolol succinate 50 mg daily, losartan 25 mg daily.  This patient's plan of care was discussed and created with  Dr. Corky Sing and he is in agreement.  Signed: Gale Journey, PA-C  05/16/2023, 9:50 AM [facility] Cardiology

## 2023-05-16 NOTE — Progress Notes (Signed)
PROGRESS NOTE    Derrick Stone   ZOX:[id number] DOB: [birth year]  DOA: 05/15/2023 Date of Service: 05/16/23 which is hospital day 1  PCP: Malva Limes, MD    HPI:  Derrick Stone is a 72 y.o. male with medical history significant of NSTEMI/CAD status post stenting x 3 with most recent stenting in 2022, HTN, HLD, presented with new onset of chest pains.   Hospital course / significant events:  10/30: to ED. Heparin gtt, underwent cardiac cath w/ balloon angioplasty but unable to deploy stent. Medical mgt w/ ASA/Plavix recommended 10/31: remains stable, per cardio if still doing well tomorrow can anticipate d/c home   Consultants:  Cardiology  Procedures/Surgeries: 10/30 cardiac catheterization w/ occluded first diagonal --> PCI balloon angioplasty successful      ASSESSMENT & PLAN:   Threatened STEMI Coronary artery disease s/p DES to LAD (2008), LCx (2022) Ischemic cardiomyopathy S/p PCI with balloon angioplasty to D1.  Echo this admission with EF 45-50%.  DAPT with aspirin and Plavix  metoprolol and losartan High potency statin    Hypertension Hyperlipidemia BP remained well controlled throughout admission.  Most recent Lipid panel with TC 151 and LDL 87. Continue atorvastatin 80 mg daily and zetia 10 mg daily.  Continue home metoprolol succinate 50 mg daily, losartan 25 mg daily.    overweight based on BMI: Body mass index is 29.64 kg/m.  Underweight - under 18.5  normal weight - 18.5 to 24.9 overweight - 25 to 29.9 obese - 30 or more   DVT prophylaxis: ambulation  IV fluids: no continuous IV fluids  Nutrition: cardiac Central lines / invasive devices: none  Code Status: FULL CODE ACP documentation reviewed: none on file in [facility]  TOC needs: none Barriers to dispo / significant pending items: cardiac clearance priot to d/c              Subjective / Brief ROS:  Patient reports no concerns at this time  Denies CP/SOB.  Pain  controlled.  Denies new weakness.  Tolerating diet.  Reports no concerns w/ urination/defecation.   Family Communication: wife at bedside on rounds     Objective Findings:  Vitals:   05/16/23 1200 05/16/23 1300 05/16/23 1400 05/16/23 1500  BP: 114/71 101/79 100/64 114/73  Pulse: 70 71 75 87  Resp: 19 18 15  (!) 31  Temp:      TempSrc:      SpO2: 95% 97% 95% 98%  Weight:      Height:        Intake/Output Summary (Last 24 hours) at 05/16/2023 1508 Last data filed at 05/16/2023 1500 Gross per 24 hour  Intake 150 ml  Output 2475 ml  Net -2325 ml   Filed Weights   05/15/23 0256 05/15/23 1000  Weight: 102.5 kg 101.9 kg    Examination:  Physical Exam Constitutional:      General: He is not in acute distress. Cardiovascular:     Rate and Rhythm: Normal rate and regular rhythm.  Pulmonary:     Breath sounds: Normal breath sounds.  Musculoskeletal:     Right lower leg: No edema.     Left lower leg: No edema.  Neurological:     General: No focal deficit present.     Mental Status: He is oriented to person, place, and time.  Psychiatric:        Mood and Affect: Mood normal.        Behavior: Behavior normal.  Scheduled Medications:   aspirin  81 mg Oral Daily   atorvastatin  80 mg Oral Daily   Chlorhexidine Gluconate Cloth  6 each Topical Daily   ciclopirox   Topical QHS   clopidogrel  75 mg Oral Q breakfast   escitalopram  10 mg Oral Daily   ezetimibe  10 mg Oral Daily   fluorouracil   Topical BID   influenza vaccine adjuvanted  0.5 mL Intramuscular Tomorrow-1000   losartan  25 mg Oral Daily   melatonin  2.5 mg Oral QHS   metoprolol succinate  50 mg Oral Daily   pantoprazole  40 mg Oral Daily    Continuous Infusions:   PRN Medications:  acetaminophen, Heparin (Porcine) in NaCl, morphine injection, nitroGLYCERIN, ondansetron (ZOFRAN) IV, mouth rinse  Antimicrobials from admission:  Anti-infectives (From admission, onward)    None            Data Reviewed:  I have personally reviewed the following...  CBC: Recent Labs  Lab 05/15/23 0302 05/16/23 0437  WBC 10.9* 11.4*  NEUTROABS 4.8  --   HGB 14.7 13.4  HCT 43.6 38.4*  MCV 91.0 88.7  PLT 112* 118*   Basic Metabolic Panel: Recent Labs  Lab 05/15/23 0302 05/16/23 0437  NA 135 136  K 3.8 3.9  CL 103 105  CO2 22 25  GLUCOSE 134* 118*  BUN 18 14  CREATININE 1.08 0.97  CALCIUM 8.8* 8.6*  MG 2.2  --    GFR: Estimated Creatinine Clearance: 90.2 mL/min (by C-G formula based on SCr of 0.97 mg/dL). Liver Function Tests: No results for input(s): "AST", "ALT", "ALKPHOS", "BILITOT", "PROT", "ALBUMIN" in the last 168 hours. No results for input(s): "LIPASE", "AMYLASE" in the last 168 hours. No results for input(s): "AMMONIA" in the last 168 hours. Coagulation Profile: Recent Labs  Lab 05/15/23 0302  INR 1.0   Cardiac Enzymes: No results for input(s): "CKTOTAL", "CKMB", "CKMBINDEX", "TROPONINI" in the last 168 hours. BNP (last 3 results) No results for input(s): "PROBNP" in the last 8760 hours. HbA1C: No results for input(s): "HGBA1C" in the last 72 hours. CBG: Recent Labs  Lab 05/15/23 1007  GLUCAP 124*   Lipid Profile: No results for input(s): "CHOL", "HDL", "LDLCALC", "TRIG", "CHOLHDL", "LDLDIRECT" in the last 72 hours. Thyroid Function Tests: No results for input(s): "TSH", "T4TOTAL", "FREET4", "T3FREE", "THYROIDAB" in the last 72 hours. Anemia Panel: No results for input(s): "VITAMINB12", "FOLATE", "FERRITIN", "TIBC", "IRON", "RETICCTPCT" in the last 72 hours. Most Recent Urinalysis On File:  No results found for: "COLORURINE", "APPEARANCEUR", "LABSPEC", "PHURINE", "GLUCOSEU", "HGBUR", "BILIRUBINUR", "KETONESUR", "PROTEINUR", "UROBILINOGEN", "NITRITE", "LEUKOCYTESUR" Sepsis Labs: @LABRCNTIP (procalcitonin:4,lacticidven:4) Microbiology: Recent Results (from the past 240 hour(s))  MRSA Next Gen by PCR, Nasal     Status: None   Collection  Time: 05/15/23 10:09 AM   Specimen: Nasal Mucosa; Nasal Swab  Result Value Ref Range Status   MRSA by PCR Next Gen NOT DETECTED NOT DETECTED Final    Comment: (NOTE) The GeneXpert MRSA Assay (FDA approved for NASAL specimens only), is one component of a comprehensive MRSA colonization surveillance program. It is not intended to diagnose MRSA infection nor to guide or monitor treatment for MRSA infections. Test performance is not FDA approved in patients less than 20 years old. Performed at [facility], [address], [place], [place] [address]       Radiology Studies last 3 days: ECHOCARDIOGRAM COMPLETE  Result Date: 05/15/2023    ECHOCARDIOGRAM REPORT   Patient Name:   [facility]  Johnnette Barrios Date of Exam: 05/15/2023 Medical Rec #:  [id number]     Height:       73.0 in Accession #:    [id number]    Weight:       224.6 lb Date of Birth:  [birth year]    BSA:          2.261 m Patient Age:    71 years      BP:           125/71 mmHg Patient Gender: M             HR:           93 bpm. Exam Location:  [facility] Procedure: 2D Echo, Cardiac Doppler, Color Doppler, 3D Echo and Strain Analysis Indications:     NSTEMI  History:         Patient has prior history of Echocardiogram examinations, most                  recent 09/21/2020. Acute MI, Previous Myocardial Infarction and                  CAD; Risk Factors:Hypertension and Dyslipidemia.  Sonographer:     Mikki Harbor Referring Phys:  [id number] CARALYN HUDSON Diagnosing Phys: Windell Norfolk  Sonographer Comments: Image acquisition challenging due to respiratory motion. Global longitudinal strain was attempted. IMPRESSIONS  1. Left ventricular ejection fraction by 2D MOD biplane is 50.2 %. The left ventricle has mildly decreased function. The left ventricle demonstrates regional wall motion abnormalities (see scoring diagram/findings for description). There is mild left ventricular hypertrophy. Left ventricular diastolic parameters are consistent with Grade  I diastolic dysfunction (impaired relaxation).  2. Right ventricular systolic function is normal. The right ventricular size is normal.  3. The mitral valve is normal in structure. Trivial mitral valve regurgitation.  4. The aortic valve is tricuspid. Aortic valve regurgitation is not visualized.  5. The inferior vena cava is normal in size with greater than 50% respiratory variability, suggesting right atrial pressure of 3 mmHg. FINDINGS  Left Ventricle: Left ventricular ejection fraction by 2D MOD biplane is 50.2 %. The left ventricle has mildly decreased function. The left ventricle demonstrates regional wall motion abnormalities. The left ventricular internal cavity size was normal in  size. There is mild left ventricular hypertrophy. Left ventricular diastolic parameters are consistent with Grade I diastolic dysfunction (impaired relaxation).  LV Wall Scoring: The mid and distal anterior wall, apical lateral segment, and mid anterolateral segment are hypokinetic. The entire septum, entire inferior wall, posterior wall, basal anterolateral segment, basal anterior segment, and apex are normal. Right Ventricle: The right ventricular size is normal. No increase in right ventricular wall thickness. Right ventricular systolic function is normal. Left Atrium: Left atrial size was normal in size. Right Atrium: Right atrial size was normal in size. Pericardium: There is no evidence of pericardial effusion. Mitral Valve: The mitral valve is normal in structure. Trivial mitral valve regurgitation. MV peak gradient, 4.2 mmHg. The mean mitral valve gradient is 1.0 mmHg. Tricuspid Valve: The tricuspid valve is normal in structure. Tricuspid valve regurgitation is trivial. Aortic Valve: The aortic valve is tricuspid. Aortic valve regurgitation is not visualized. Aortic valve mean gradient measures 1.0 mmHg. Aortic valve peak gradient measures 3.2 mmHg. Aortic valve area, by VTI measures 3.99 cm. Pulmonic Valve: The pulmonic  valve was not well visualized. Pulmonic valve regurgitation is trivial. Aorta: The aortic root and ascending aorta are structurally normal, with no evidence of  dilitation. Venous: The inferior vena cava is normal in size with greater than 50% respiratory variability, suggesting right atrial pressure of 3 mmHg. IAS/Shunts: The interatrial septum was not assessed.  LEFT VENTRICLE PLAX 2D                        Biplane EF (MOD) LVIDd:         5.60 cm         LV Biplane EF:   Left LVIDs:         3.80 cm                          ventricular LV PW:         1.20 cm                          ejection LV IVS:        1.30 cm                          fraction by LVOT diam:     2.20 cm                          2D MOD LV SV:         72                               biplane is LV SV Index:   32                               50.2 %. LVOT Area:     3.80 cm                                Diastology                                LV e' medial:    6.09 cm/s LV Volumes (MOD)               LV E/e' medial:  9.4 LV vol d, MOD    76.3 ml       LV e' lateral:   5.44 cm/s A2C:                           LV E/e' lateral: 10.5 LV vol d, MOD    83.0 ml A4C: LV vol s, MOD    43.7 ml A2C: LV vol s, MOD    40.5 ml A4C: LV SV MOD A2C:   32.6 ml LV SV MOD A4C:   83.0 ml LV SV MOD BP:    42.7 ml RIGHT VENTRICLE RV Basal diam:  3.95 cm RV Mid diam:    4.00 cm RV S prime:     20.30 cm/s LEFT ATRIUM             Index        RIGHT ATRIUM           Index LA diam:        4.00 cm 1.77 cm/m   RA  Area:     14.40 cm LA Vol (A2C):   32.2 ml 14.24 ml/m  RA Volume:   37.00 ml  16.37 ml/m LA Vol (A4C):   36.5 ml 16.15 ml/m LA Biplane Vol: 33.2 ml 14.69 ml/m  AORTIC VALVE                    PULMONIC VALVE AV Area (Vmax):    3.53 cm     PV Vmax:       0.93 m/s AV Area (Vmean):   3.77 cm     PV Peak grad:  3.5 mmHg AV Area (VTI):     3.99 cm AV Vmax:           89.80 cm/s AV Vmean:          53.000 cm/s AV VTI:            0.181 m AV Peak Grad:      3.2 mmHg AV  Mean Grad:      1.0 mmHg LVOT Vmax:         83.40 cm/s LVOT Vmean:        52.600 cm/s LVOT VTI:          0.190 m LVOT/AV VTI ratio: 1.05  AORTA Ao Root diam: 3.70 cm Ao Asc diam:  3.40 cm MITRAL VALVE MV Area (PHT): 4.39 cm    SHUNTS MV Area VTI:   4.17 cm    Systemic VTI:  0.19 m MV Peak grad:  4.2 mmHg    Systemic Diam: 2.20 cm MV Mean grad:  1.0 mmHg MV Vmax:       1.03 m/s MV Vmean:      54.2 cm/s MV Decel Time: 173 msec MV E velocity: 57.30 cm/s MV A velocity: 92.60 cm/s MV E/A ratio:  0.62 Windell Norfolk Electronically signed by Windell Norfolk Signature Date/Time: 05/15/2023/4:47:13 PM    Final    CARDIAC CATHETERIZATION  Result Date: 05/15/2023   Prox LAD to Mid LAD lesion is 10% stenosed.   Dist LAD lesion is 25% stenosed.   Prox RCA to Mid RCA lesion is 100% stenosed.   1st Diag-1 lesion is 50% stenosed.   1st Diag-2 lesion is 100% stenosed.   Non-stenotic Mid Cx lesion was previously treated.   Balloon angioplasty was performed using a BALLN MINITREK RX 1.5X12.   Post intervention, there is a 25% residual stenosis.   There is mild left ventricular systolic dysfunction.   LV end diastolic pressure is mildly elevated.   The left ventricular ejection fraction is 45-50% by visual estimate.   Anticipated discharge date to be determined.   Recommend uninterrupted dual antiplatelet therapy with Aspirin 81mg  daily and Clopidogrel 75mg  daily for a minimum of 12 months (ACS-Class I recommendation). Conclusion Urgent diagnostic cath with abnormal EKG concerning for possible STEMI.  Patient had slight ST elevation in 1 and now reciprocal depression in 2 3 and F concerning for lateral STEMI/acute coronary syndrome Patient was seen by me you still having chest pain so we elected to bring the patient to the cardiac Cath Lab Right groin approach Left ventriculogram Mildly reduced left ventricular function EF around 45 to 50% with anterior lateral hypokinesis Left ventriculogram Coronaries Left main large 25 to 50%  ostial lesion TIMI-3 flow LAD large widely patent proximal to mid stent minor irregularities Diagonal 1 moderate in size 100% occluded proximally IRA TIMI 0 flow jailed by the LAD stent IRA Circumflex large widely patent mid stent  only minor irregularities RCA 100% occluded proximally CTO moderate collaterals left to right Intervention Successful PCI of diagonal 1 balloon 1.5 x 12 mm to 14 atm Lesion reduced from 100 down to 25% TIMI 0 to TIMI-3 flow Left main evaluated by IVUS found to have area of 6.3 which was thought to be nonsignificant Unable to successfully deploy stent Patient placed on aspirin Plavix long-term Have the patient follow-up with cardiology as an outpatient  DG Chest Portable 1 View  Result Date: 05/15/2023 CLINICAL DATA:  Chest pain, resolved EXAM: PORTABLE CHEST 1 VIEW COMPARISON:  09/19/2020 FINDINGS: Normal heart size and mediastinal contours. No acute infiltrate or edema. No effusion or pneumothorax. No acute osseous findings. IMPRESSION: Negative low volume chest. Electronically Signed   By: Tiburcio Pea M.D.   On: 05/15/2023 05:06          Sunnie Nielsen, DO [facility] 05/16/2023, 3:08 PM    Dictation software may have been used to generate the above note. Typos may occur and escape review in typed/dictated notes. Please contact Dr Lyn Hollingshead directly for clarity if needed.  Staff may message me via secure chat in Epic  but this may not receive an immediate response,  please page me for urgent matters!  If 7PM-7AM, please contact night coverage [email or web address]

## 2023-05-16 NOTE — Progress Notes (Signed)
Patient had a small episode of nausea (pt states he's only had nausea three times in his life and each of those was with his previous MI's). Administered prn zofran and did an EKG, pt complaining of no other symptom, notified Cardiology MD as well as attending, no new orders placed. Will continue to monitor

## 2023-05-17 DIAGNOSIS — I214 Non-ST elevation (NSTEMI) myocardial infarction: Secondary | ICD-10-CM | POA: Diagnosis not present

## 2023-05-17 LAB — BASIC METABOLIC PANEL
Anion gap: 9 (ref 5–15)
BUN: 17 mg/dL (ref 8–23)
CO2: 24 mmol/L (ref 22–32)
Calcium: 9 mg/dL (ref 8.9–10.3)
Chloride: 102 mmol/L (ref 98–111)
Creatinine, Ser: 1.09 mg/dL (ref 0.61–1.24)
GFR, Estimated: >60 mL/min (ref >60)
Glucose, Bld: 110 mg/dL — ABNORMAL HIGH (ref 70–99)
Potassium: 4 mmol/L (ref 3.5–5.1)
Sodium: 135 mmol/L (ref 135–145)

## 2023-05-17 LAB — LIPOPROTEIN A (LPA): Lipoprotein (a): <8.4 nmol/L (ref <75.0)

## 2023-05-17 MED ORDER — NITROGLYCERIN 0.4 MG SL SUBL: 0.4000 mg | SUBLINGUAL_TABLET | SUBLINGUAL | 0 refills | Status: DC | PRN

## 2023-05-17 MED ORDER — CLOPIDOGREL BISULFATE 75 MG PO TABS: 75.0000 mg | ORAL_TABLET | Freq: Every day | ORAL | 0 refills | Status: AC

## 2023-05-17 NOTE — Plan of Care (Signed)
  Problem: Activity: Goal: Ability to tolerate increased activity will improve Outcome: Progressing   Problem: Cardiac: Goal: Ability to achieve and maintain adequate cardiovascular perfusion will improve Outcome: Progressing   Problem: Health Behavior/Discharge Planning: Goal: Ability to safely manage health-related needs after discharge will improve Outcome: Progressing   Problem: Education: Goal: Knowledge of General Education information will improve Description: Including pain rating scale, medication(s)/side effects and non-pharmacologic comfort measures Outcome: Progressing   Problem: Health Behavior/Discharge Planning: Goal: Ability to manage health-related needs will improve Outcome: Progressing

## 2023-05-17 NOTE — Care Management Important Message (Signed)
Important Message  Patient Details  Name: Derrick Stone MRN: [id number] Date of Birth: 1951-04-20   Important Message Given:  N/A - LOS <3 / Initial given by admissions     Derrick Stone A Derrick Stone 05/17/2023, 8:31 AM

## 2023-05-17 NOTE — Progress Notes (Signed)
[facility] Cardiology  CARDIOLOGY PROGRESS NOTE  Patient ID: Derrick Stone MRN: [id number] DOB/AGE: 1950-10-31 72 y.o.  Admit date: 05/15/2023 Referring Physician Dr. Deneen Harts Primary Physician Sherrie Mustache, Demetrios Isaacs, MD  Primary Cardiologist Dr. Dorothyann Peng Reason for Consultation chest pain, MI  HPI: 72 year old male who presented with chest pain, noted to have threatened STEMI, cath with occluded first diagonal, successful balloon angioplasty.  Patient doing well this morning without any complaint.  No complaints of chest pain or shortness of breath.  Review of systems complete and found to be negative unless listed above     Past Medical History:  Diagnosis Date   CAD (coronary artery disease)    History of chickenpox    History of mumps    Hyperlipidemia    Hypertension    NSTEMI (non-ST elevated myocardial infarction) (HCC) 09/19/2020    Past Surgical History:  Procedure Laterality Date   CORONARY STENT INTERVENTION N/A 09/20/2020   Procedure: CORONARY STENT INTERVENTION;  Surgeon: Alwyn Pea, MD;  Location: [facility] INVASIVE CV LAB;  Service: Cardiovascular;  Laterality: N/A;   CORONARY ULTRASOUND/IVUS N/A 05/15/2023   Procedure: Coronary Ultrasound/IVUS;  Surgeon: Alwyn Pea, MD;  Location: [facility] INVASIVE CV LAB;  Service: Cardiovascular;  Laterality: N/A;   Heart artery stent  2008   x2   HERNIA REPAIR  2016   LEFT HEART CATH AND CORONARY ANGIOGRAPHY N/A 09/20/2020   Procedure: LEFT HEART CATH AND CORONARY ANGIOGRAPHY and possible PCI and stent;  Surgeon: Alwyn Pea, MD;  Location: [facility] INVASIVE CV LAB;  Service: Cardiovascular;  Laterality: N/A;   LEFT HEART CATH AND CORONARY ANGIOGRAPHY N/A 05/15/2023   Procedure: LEFT HEART CATH AND CORONARY ANGIOGRAPHY;  Surgeon: Alwyn Pea, MD;  Location: [facility] INVASIVE CV LAB;  Service: Cardiovascular;  Laterality: N/A;    Medications Prior to Admission  Medication Sig Dispense Refill Last Dose   aspirin EC  81 MG tablet Take 81 mg by mouth daily.      atorvastatin (LIPITOR) 80 MG tablet Take 1 tablet (80 mg total) by mouth daily. 90 tablet 1    ciclopirox (PENLAC) 8 % solution Apply topically at bedtime. Apply over nail and surrounding skin. Apply daily over previous coat. After seven (7) days, may remove with alcohol and continue cycle. 6.6 mL 0    clopidogrel (PLAVIX) 75 MG tablet Take 75 mg by mouth daily.      escitalopram (LEXAPRO) 10 MG tablet TAKE 1 TABLET (10 MG TOTAL) BY MOUTH ONCE DAILY.  1    ezetimibe (ZETIA) 10 MG tablet Take 1 tablet (10 mg total) by mouth daily. 90 tablet 3    fluorouracil (EFUDEX) 5 % cream After Thanksgiving apply the forehead, temples, and crown scalp BID x 7 days. 30 g 0    losartan (COZAAR) 25 MG tablet Take 1 tablet (25 mg total) by mouth daily. 30 tablet 1    metoprolol succinate (TOPROL-XL) 50 MG 24 hr tablet Take 1 tablet by mouth daily.      Omega-3 Fatty Acids (FISH OIL PO) Take 1 capsule by mouth daily.      omeprazole (PRILOSEC OTC) 20 MG tablet Take 1 tablet by mouth daily.      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  Tobacco Use   Smoking status: Never   Smokeless tobacco: Never  Vaping Use   Vaping status: Never Used  Substance and Sexual Activity   Alcohol use: No   Drug use: No   Sexual activity: Not on file  Other Topics Concern   Not on file  Social History Narrative   Not on file   Social Determinants of Health   Financial Resource Strain: Low Risk  (12/25/2022)   Overall Financial Resource Strain (CARDIA)    Difficulty of Paying Living Expenses: Not hard at all  Food Insecurity: No Food Insecurity (05/15/2023)   Hunger Vital Sign    Worried About Running Out of Food in the  Last Year: Never true    Ran Out of Food in the Last Year: Never true  Transportation Needs: No Transportation Needs (05/15/2023)   PRAPARE - Administrator, Civil Service (Medical): No    Lack of Transportation (Non-Medical): No  Physical Activity: Sufficiently Active (12/25/2022)   Exercise Vital Sign    Days of Exercise per Week: 3 days    Minutes of Exercise per Session: 60 min  Stress: No Stress Concern Present (12/25/2022)   [employer] of Occupational Health - Occupational Stress Questionnaire    Feeling of Stress : Not at all  Social Connections: Moderately Integrated (12/25/2022)   Social Connection and Isolation Panel [NHANES]    Frequency of Communication with Friends and Family: More than three times a week    Frequency of Social Gatherings with Friends and Family: More than three times a week    Attends Religious Services: Never    Database administrator or Organizations: Yes    Attends Engineer, structural: More than 4 times per year    Marital Status: Married  Catering manager Violence: Not At Risk (05/15/2023)   Humiliation, Afraid, Rape, and Kick questionnaire    Fear of Current or Ex-Partner: No    Emotionally Abused: No    Physically Abused: No    Sexually Abused: No    Family History  Problem Relation Age of Onset   Melanoma Mother    Diabetes Father    Bone cancer Father    Testicular cancer Brother    Heart disease Paternal Grandfather       Review of systems complete and found to be negative unless listed above      PHYSICAL EXAM  General: Well developed, well nourished, in no acute distress HEENT:  Normocephalic and atramatic Neck:  No JVD.  Lungs: Clear bilaterally to auscultation and percussion. Heart: HRRR . Normal S1 and S2 without gallops or murmurs.  Extremities: No clubbing, cyanosis or edema.  Bruise noted in the groin at cath site.  No hematoma or swelling.  No tenderness Neuro: Alert and oriented X  3.  Labs:   Lab Results  Component Value Date   WBC 11.4 (H) 05/16/2023   HGB 13.4 05/16/2023   HCT 38.4 (L) 05/16/2023   MCV 88.7 05/16/2023   PLT 118 (L) 05/16/2023    Recent Labs  Lab 05/17/23 0241  NA 135  K 4.0  CL 102  CO2 24  BUN 17  CREATININE 1.09  CALCIUM 9.0  GLUCOSE 110*   No results found for: "CKTOTAL", "CKMB", "CKMBINDEX", "TROPONINI"  Lab Results  Component Value Date   CHOL 151 01/30/2023   CHOL 154 12/06/2021   CHOL 128 10/25/2020   Lab Results  Component Value Date   HDL 45 01/30/2023   HDL 40 12/06/2021   HDL 33 (L) 10/25/2020   Lab Results  Component Value Date   LDLCALC 87 01/30/2023   LDLCALC 92 12/06/2021   LDLCALC 72 10/25/2020   Lab Results  Component Value Date   TRIG 105 01/30/2023   TRIG 123 12/06/2021   TRIG 127 10/25/2020   Lab Results  Component Value Date   CHOLHDL 3.4 01/30/2023   CHOLHDL 3.9 12/06/2021   CHOLHDL 3.9 10/25/2020   No results found for: "LDLDIRECT"    Radiology: ECHOCARDIOGRAM COMPLETE  Result Date: 05/15/2023    ECHOCARDIOGRAM REPORT   Patient Name:   Derrick Stone Date of Exam: 05/15/2023 Medical Rec #:  [id number]     Height:       73.0 in Accession #:    [id number]    Weight:       224.6 lb Date of Birth:  1951/05/07    BSA:          2.261 m Patient Age:    71 years      BP:           125/71 mmHg Patient Gender: M             HR:           93 bpm. Exam Location:  [facility] Procedure: 2D Echo, Cardiac Doppler, Color Doppler, 3D Echo and Strain Analysis Indications:     NSTEMI  History:         Patient has prior history of Echocardiogram examinations, most                  recent 09/21/2020. Acute MI, Previous Myocardial Infarction and                  CAD; Risk Factors:Hypertension and Dyslipidemia.  Sonographer:     Mikki Harbor Referring Phys:  [id number] CARALYN HUDSON Diagnosing Phys: Windell Norfolk  Sonographer Comments: Image acquisition challenging due to respiratory motion. Global longitudinal strain was  attempted. IMPRESSIONS  1. Left ventricular ejection fraction by 2D MOD biplane is 50.2 %. The left ventricle has mildly decreased function. The left ventricle demonstrates regional wall motion abnormalities (see scoring diagram/findings for description). There is mild left ventricular hypertrophy. Left ventricular diastolic parameters are consistent with Grade I diastolic dysfunction (impaired relaxation).  2. Right ventricular systolic function is normal. The right ventricular size is normal.  3. The mitral valve is normal in structure. Trivial mitral valve regurgitation.  4. The aortic valve is tricuspid. Aortic valve regurgitation is not visualized.  5. The inferior vena cava is normal in size with greater than 50% respiratory variability, suggesting right atrial pressure of 3 mmHg. FINDINGS  Left Ventricle: Left ventricular ejection fraction by 2D MOD biplane is 50.2 %. The left ventricle has mildly decreased function. The left ventricle demonstrates regional wall motion abnormalities. The left ventricular internal cavity size was normal in  size. There is mild left ventricular hypertrophy. Left ventricular diastolic parameters are consistent with Grade I diastolic dysfunction (impaired relaxation).  LV Wall Scoring: The mid and distal anterior wall, apical lateral segment, and mid anterolateral segment are hypokinetic. The entire septum, entire inferior wall, posterior wall, basal anterolateral segment, basal anterior segment, and apex are normal. Right Ventricle: The right ventricular size is normal. No increase in right ventricular wall thickness. Right ventricular systolic function is normal. Left Atrium: Left atrial size was normal in size. Right Atrium: Right atrial  size was normal in size. Pericardium: There is no evidence of pericardial effusion. Mitral Valve: The mitral valve is normal in structure. Trivial mitral valve regurgitation. MV peak gradient, 4.2 mmHg. The mean mitral valve gradient is 1.0  mmHg. Tricuspid Valve: The tricuspid valve is normal in structure. Tricuspid valve regurgitation is trivial. Aortic Valve: The aortic valve is tricuspid. Aortic valve regurgitation is not visualized. Aortic valve mean gradient measures 1.0 mmHg. Aortic valve peak gradient measures 3.2 mmHg. Aortic valve area, by VTI measures 3.99 cm. Pulmonic Valve: The pulmonic valve was not well visualized. Pulmonic valve regurgitation is trivial. Aorta: The aortic root and ascending aorta are structurally normal, with no evidence of dilitation. Venous: The inferior vena cava is normal in size with greater than 50% respiratory variability, suggesting right atrial pressure of 3 mmHg. IAS/Shunts: The interatrial septum was not assessed.  LEFT VENTRICLE PLAX 2D                        Biplane EF (MOD) LVIDd:         5.60 cm         LV Biplane EF:   Left LVIDs:         3.80 cm                          ventricular LV PW:         1.20 cm                          ejection LV IVS:        1.30 cm                          fraction by LVOT diam:     2.20 cm                          2D MOD LV SV:         72                               biplane is LV SV Index:   32                               50.2 %. LVOT Area:     3.80 cm                                Diastology                                LV e' medial:    6.09 cm/s LV Volumes (MOD)               LV E/e' medial:  9.4 LV vol d, MOD    76.3 ml       LV e' lateral:   5.44 cm/s A2C:                           LV E/e' lateral: 10.5 LV vol d, MOD    83.0 ml A4C: LV vol s, MOD  43.7 ml A2C: LV vol s, MOD    40.5 ml A4C: LV SV MOD A2C:   32.6 ml LV SV MOD A4C:   83.0 ml LV SV MOD BP:    42.7 ml RIGHT VENTRICLE RV Basal diam:  3.95 cm RV Mid diam:    4.00 cm RV S prime:     20.30 cm/s LEFT ATRIUM             Index        RIGHT ATRIUM           Index LA diam:        4.00 cm 1.77 cm/m   RA Area:     14.40 cm LA Vol (A2C):   32.2 ml 14.24 ml/m  RA Volume:   37.00 ml  16.37 ml/m LA Vol (A4C):    36.5 ml 16.15 ml/m LA Biplane Vol: 33.2 ml 14.69 ml/m  AORTIC VALVE                    PULMONIC VALVE AV Area (Vmax):    3.53 cm     PV Vmax:       0.93 m/s AV Area (Vmean):   3.77 cm     PV Peak grad:  3.5 mmHg AV Area (VTI):     3.99 cm AV Vmax:           89.80 cm/s AV Vmean:          53.000 cm/s AV VTI:            0.181 m AV Peak Grad:      3.2 mmHg AV Mean Grad:      1.0 mmHg LVOT Vmax:         83.40 cm/s LVOT Vmean:        52.600 cm/s LVOT VTI:          0.190 m LVOT/AV VTI ratio: 1.05  AORTA Ao Root diam: 3.70 cm Ao Asc diam:  3.40 cm MITRAL VALVE MV Area (PHT): 4.39 cm    SHUNTS MV Area VTI:   4.17 cm    Systemic VTI:  0.19 m MV Peak grad:  4.2 mmHg    Systemic Diam: 2.20 cm MV Mean grad:  1.0 mmHg MV Vmax:       1.03 m/s MV Vmean:      54.2 cm/s MV Decel Time: 173 msec MV E velocity: 57.30 cm/s MV A velocity: 92.60 cm/s MV E/A ratio:  0.62 Windell Norfolk Electronically signed by Windell Norfolk Signature Date/Time: 05/15/2023/4:47:13 PM    Final    CARDIAC CATHETERIZATION  Result Date: 05/15/2023   Prox LAD to Mid LAD lesion is 10% stenosed.   Dist LAD lesion is 25% stenosed.   Prox RCA to Mid RCA lesion is 100% stenosed.   1st Diag-1 lesion is 50% stenosed.   1st Diag-2 lesion is 100% stenosed.   Non-stenotic Mid Cx lesion was previously treated.   Balloon angioplasty was performed using a BALLN MINITREK RX 1.5X12.   Post intervention, there is a 25% residual stenosis.   There is mild left ventricular systolic dysfunction.   LV end diastolic pressure is mildly elevated.   The left ventricular ejection fraction is 45-50% by visual estimate.   Anticipated discharge date to be determined.   Recommend uninterrupted dual antiplatelet therapy with Aspirin 81mg  daily and Clopidogrel 75mg  daily for a minimum of 12 months (ACS-Class I recommendation). Conclusion Urgent diagnostic cath with abnormal EKG concerning for  possible STEMI.  Patient had slight ST elevation in 1 and now reciprocal depression in 2  3 and F concerning for lateral STEMI/acute coronary syndrome Patient was seen by me you still having chest pain so we elected to bring the patient to the cardiac Cath Lab Right groin approach Left ventriculogram Mildly reduced left ventricular function EF around 45 to 50% with anterior lateral hypokinesis Left ventriculogram Coronaries Left main large 25 to 50% ostial lesion TIMI-3 flow LAD large widely patent proximal to mid stent minor irregularities Diagonal 1 moderate in size 100% occluded proximally IRA TIMI 0 flow jailed by the LAD stent IRA Circumflex large widely patent mid stent only minor irregularities RCA 100% occluded proximally CTO moderate collaterals left to right Intervention Successful PCI of diagonal 1 balloon 1.5 x 12 mm to 14 atm Lesion reduced from 100 down to 25% TIMI 0 to TIMI-3 flow Left main evaluated by IVUS found to have area of 6.3 which was thought to be nonsignificant Unable to successfully deploy stent Patient placed on aspirin Plavix long-term Have the patient follow-up with cardiology as an outpatient  DG Chest Portable 1 View  Result Date: 05/15/2023 CLINICAL DATA:  Chest pain, resolved EXAM: PORTABLE CHEST 1 VIEW COMPARISON:  09/19/2020 FINDINGS: Normal heart size and mediastinal contours. No acute infiltrate or edema. No effusion or pneumothorax. No acute osseous findings. IMPRESSION: Negative low volume chest. Electronically Signed   By: Tiburcio Pea M.D.   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 Continue high-dose statin, Zetia. Continue Toprol-XL and losartan. Cardiology follow-up as outpatient.  Signed: Kathryne Gin MD,PhD, [facility] 05/17/2023, 7:17 AM

## 2023-05-17 NOTE — Discharge Summary (Signed)
Physician Discharge Summary   Patient: Derrick Stone MRN: [id number]  DOB: 1950/09/25   Admit:     Date of Admission: 05/15/2023 Admitted from: home   Discharge: Date of discharge: 05/17/23 Disposition: Home Condition at discharge: good  CODE STATUS: FULL CODE     Discharge Physician: Sunnie Nielsen, DO [facility]     PCP: Malva Limes, MD  Recommendations for Outpatient Follow-up:  Follow up with PCP Malva Limes, MD in 1-2 weeks Follow as directed w/ cardiology   Discharge Instructions     AMB Referral to Cardiac Rehabilitation - Phase II   Complete by: As directed    Diagnosis:  STEMI Coronary Stents     After initial evaluation and assessments completed: Virtual Based Care may be provided alone or in conjunction with Phase 2 Cardiac Rehab based on patient barriers.: Yes         Discharge Diagnoses: Principal Problem:   NSTEMI (non-ST elevated myocardial infarction) [facility])       Hospital Course: HPI:  Derrick Stone is a 72 y.o. male with medical history significant of NSTEMI/CAD status post stenting x 3 with most recent stenting in 2022, HTN, HLD, presented with new onset of chest pains.   Hospital course / significant events:  10/30: to ED. Heparin gtt, underwent cardiac cath w/ balloon angioplasty but unable to deploy stent. Medical mgt w/ ASA/Plavix recommended 10/31: remains stable, per cardio if still doing well tomorrow can anticipate d/c home  11/01: stable, ok for discharge   Consultants:  Cardiology  Procedures/Surgeries: 10/30 cardiac catheterization w/ occluded first diagonal --> PCI balloon angioplasty successful      ASSESSMENT & PLAN:   Threatened STEMI Coronary artery disease s/p DES to LAD (2008), LCx (2022) Ischemic cardiomyopathy S/p PCI with balloon angioplasty to D1.  Echo this admission with EF 45-50%.  DAPT with aspirin and Plavix  metoprolol  losartan High potency statin     Hypertension Hyperlipidemia BP remained well controlled throughout admission.  Most recent Lipid panel with TC 151 and LDL 87. Meds as above    overweight based on BMI: Body mass index is 29.64 kg/m.  Underweight - under 18.5  normal weight - 18.5 to 24.9 overweight - 25 to 29.9 obese - 30 or more             Discharge Instructions  Allergies as of 05/17/2023       Reactions   Gold-containing Drug Products    Nitroglycerin Other (See Comments)   Passed out when given to him once        Medication List     TAKE these medications    aspirin EC 81 MG tablet Take 81 mg by mouth daily.   atorvastatin 80 MG tablet Commonly known as: LIPITOR Take 1 tablet (80 mg total) by mouth daily.   ciclopirox 8 % solution Commonly known as: PENLAC Apply topically at bedtime. Apply over nail and surrounding skin. Apply daily over previous coat. After seven (7) days, may remove with alcohol and continue cycle.   clopidogrel 75 MG tablet Commonly known as: PLAVIX Take 1 tablet (75 mg total) by mouth daily.   escitalopram 10 MG tablet Commonly known as: LEXAPRO TAKE 1 TABLET (10 MG TOTAL) BY MOUTH ONCE DAILY.   ezetimibe 10 MG tablet Commonly known as: Zetia Take 1 tablet (10 mg total) by mouth daily.   FISH OIL PO Take 1 capsule by mouth daily.   fluorouracil 5 %  cream Commonly known as: EFUDEX After Thanksgiving apply the forehead, temples, and crown scalp BID x 7 days.   losartan 25 MG tablet Commonly known as: COZAAR Take 1 tablet (25 mg total) by mouth daily.   metoprolol succinate 50 MG 24 hr tablet Commonly known as: TOPROL-XL Take 1 tablet by mouth daily.   nitroGLYCERIN 0.4 MG SL tablet Commonly known as: NITROSTAT Place 1 tablet (0.4 mg total) under the tongue every 5 (five) minutes as needed for chest pain.   omeprazole 20 MG tablet Commonly known as: PRILOSEC OTC Take 1 tablet by mouth daily.         Follow-up Information      Alwyn Pea, MD. Go in 1 week(s).   Specialties: Cardiology, Internal Medicine Contact information: [address] [place] [address] [phone]                 Allergies  Allergen Reactions   Gold-Containing Drug Products    Nitroglycerin Other (See Comments)    Passed out when given to him once     Subjective: pt reports feelign well this morning, no chest pain, no SOB   Discharge Exam: BP 110/77 (BP Location: Right Arm)   Pulse 77   Temp 98.2 F (36.8 C) (Oral)   Resp 16   Ht 6\' 1"  (1.854 m)   Wt 101.9 kg   SpO2 99%   BMI 29.64 kg/m  General: Pt is alert, awake, not in acute distress Cardiovascular: RRR, S1/S2 +, no rubs, no gallops Respiratory: CTA bilaterally, no wheezing, no rhonchi Abdominal: Soft, NT, ND, bowel sounds + Extremities: no edema, no cyanosis     The results of significant diagnostics from this hospitalization (including imaging, microbiology, ancillary and laboratory) are listed below for reference.     Microbiology: Recent Results (from the past 240 hour(s))  MRSA Next Gen by PCR, Nasal     Status: None   Collection Time: 05/15/23 10:09 AM   Specimen: Nasal Mucosa; Nasal Swab  Result Value Ref Range Status   MRSA by PCR Next Gen NOT DETECTED NOT DETECTED Final    Comment: (NOTE) The GeneXpert MRSA Assay (FDA approved for NASAL specimens only), is one component of a comprehensive MRSA colonization surveillance program. It is not intended to diagnose MRSA infection nor to guide or monitor treatment for MRSA infections. Test performance is not FDA approved in patients less than 28 years old. Performed at [facility], [address] [place], [place] [address]      Labs: BNP (last 3 results) Recent Labs    05/15/23 0302  BNP 27.7   Basic Metabolic Panel: Recent Labs  Lab 05/15/23 0302 05/16/23 0437 05/17/23 0241  NA 135 136 135  K 3.8 3.9 4.0  CL 103 105 102  CO2 22 25 24   GLUCOSE 134*  118* 110*  BUN 18 14 17   CREATININE 1.08 0.97 1.09  CALCIUM 8.8* 8.6* 9.0  MG 2.2  --   --    Liver Function Tests: No results for input(s): "AST", "ALT", "ALKPHOS", "BILITOT", "PROT", "ALBUMIN" in the last 168 hours. No results for input(s): "LIPASE", "AMYLASE" in the last 168 hours. No results for input(s): "AMMONIA" in the last 168 hours. CBC: Recent Labs  Lab 05/15/23 0302 05/16/23 0437  WBC 10.9* 11.4*  NEUTROABS 4.8  --   HGB 14.7 13.4  HCT 43.6 38.4*  MCV 91.0 88.7  PLT 112* 118*   Cardiac Enzymes: No results for input(s): "CKTOTAL", "  CKMB", "CKMBINDEX", "TROPONINI" in the last 168 hours. BNP: Invalid input(s): "POCBNP" CBG: Recent Labs  Lab 05/15/23 1007  GLUCAP 124*   D-Dimer No results for input(s): "DDIMER" in the last 72 hours. Hgb A1c No results for input(s): "HGBA1C" in the last 72 hours. Lipid Profile No results for input(s): "CHOL", "HDL", "LDLCALC", "TRIG", "CHOLHDL", "LDLDIRECT" in the last 72 hours. Thyroid function studies No results for input(s): "TSH", "T4TOTAL", "T3FREE", "THYROIDAB" in the last 72 hours.  Invalid input(s): "FREET3" Anemia work up No results for input(s): "VITAMINB12", "FOLATE", "FERRITIN", "TIBC", "IRON", "RETICCTPCT" in the last 72 hours. Urinalysis No results found for: "COLORURINE", "APPEARANCEUR", "LABSPEC", "PHURINE", "GLUCOSEU", "HGBUR", "BILIRUBINUR", "KETONESUR", "PROTEINUR", "UROBILINOGEN", "NITRITE", "LEUKOCYTESUR" Sepsis Labs Recent Labs  Lab 05/15/23 0302 05/16/23 0437  WBC 10.9* 11.4*   Microbiology Recent Results (from the past 240 hour(s))  MRSA Next Gen by PCR, Nasal     Status: None   Collection Time: 05/15/23 10:09 AM   Specimen: Nasal Mucosa; Nasal Swab  Result Value Ref Range Status   MRSA by PCR Next Gen NOT DETECTED NOT DETECTED Final    Comment: (NOTE) The GeneXpert MRSA Assay (FDA approved for NASAL specimens only), is one component of a comprehensive MRSA colonization surveillance program.  It is not intended to diagnose MRSA infection nor to guide or monitor treatment for MRSA infections. Test performance is not FDA approved in patients less than 71 years old. Performed at [facility], [address] [place], [place] [address]    Imaging ECHOCARDIOGRAM COMPLETE  Result Date: 05/15/2023    ECHOCARDIOGRAM REPORT   Patient Name:   Derrick Stone Date of Exam: 05/15/2023 Medical Rec #:  [id number]     Height:       73.0 in Accession #:    [id number]    Weight:       224.6 lb Date of Birth:  Sep 16, 1950    BSA:          2.261 m Patient Age:    71 years      BP:           125/71 mmHg Patient Gender: M             HR:           93 bpm. Exam Location:  [facility] Procedure: 2D Echo, Cardiac Doppler, Color Doppler, 3D Echo and Strain Analysis Indications:     NSTEMI  History:         Patient has prior history of Echocardiogram examinations, most                  recent 09/21/2020. Acute MI, Previous Myocardial Infarction and                  CAD; Risk Factors:Hypertension and Dyslipidemia.  Sonographer:     Mikki Harbor Referring Phys:  [id number] CARALYN HUDSON Diagnosing Phys: Windell Norfolk  Sonographer Comments: Image acquisition challenging due to respiratory motion. Global longitudinal strain was attempted. IMPRESSIONS  1. Left ventricular ejection fraction by 2D MOD biplane is 50.2 %. The left ventricle has mildly decreased function. The left ventricle demonstrates regional wall motion abnormalities (see scoring diagram/findings for description). There is mild left ventricular hypertrophy. Left ventricular diastolic parameters are consistent with Grade I diastolic dysfunction (impaired relaxation).  2. Right ventricular systolic function is normal. The right ventricular size is normal.  3. The mitral valve is normal in structure. Trivial mitral valve regurgitation.  4. The aortic valve is tricuspid.  Aortic valve regurgitation is not visualized.  5. The inferior vena cava is normal in size  with greater than 50% respiratory variability, suggesting right atrial pressure of 3 mmHg. FINDINGS  Left Ventricle: Left ventricular ejection fraction by 2D MOD biplane is 50.2 %. The left ventricle has mildly decreased function. The left ventricle demonstrates regional wall motion abnormalities. The left ventricular internal cavity size was normal in  size. There is mild left ventricular hypertrophy. Left ventricular diastolic parameters are consistent with Grade I diastolic dysfunction (impaired relaxation).  LV Wall Scoring: The mid and distal anterior wall, apical lateral segment, and mid anterolateral segment are hypokinetic. The entire septum, entire inferior wall, posterior wall, basal anterolateral segment, basal anterior segment, and apex are normal. Right Ventricle: The right ventricular size is normal. No increase in right ventricular wall thickness. Right ventricular systolic function is normal. Left Atrium: Left atrial size was normal in size. Right Atrium: Right atrial size was normal in size. Pericardium: There is no evidence of pericardial effusion. Mitral Valve: The mitral valve is normal in structure. Trivial mitral valve regurgitation. MV peak gradient, 4.2 mmHg. The mean mitral valve gradient is 1.0 mmHg. Tricuspid Valve: The tricuspid valve is normal in structure. Tricuspid valve regurgitation is trivial. Aortic Valve: The aortic valve is tricuspid. Aortic valve regurgitation is not visualized. Aortic valve mean gradient measures 1.0 mmHg. Aortic valve peak gradient measures 3.2 mmHg. Aortic valve area, by VTI measures 3.99 cm. Pulmonic Valve: The pulmonic valve was not well visualized. Pulmonic valve regurgitation is trivial. Aorta: The aortic root and ascending aorta are structurally normal, with no evidence of dilitation. Venous: The inferior vena cava is normal in size with greater than 50% respiratory variability, suggesting right atrial pressure of 3 mmHg. IAS/Shunts: The interatrial  septum was not assessed.  LEFT VENTRICLE PLAX 2D                        Biplane EF (MOD) LVIDd:         5.60 cm         LV Biplane EF:   Left LVIDs:         3.80 cm                          ventricular LV PW:         1.20 cm                          ejection LV IVS:        1.30 cm                          fraction by LVOT diam:     2.20 cm                          2D MOD LV SV:         72                               biplane is LV SV Index:   32                               50.2 %. LVOT Area:     3.80  cm                                Diastology                                LV e' medial:    6.09 cm/s LV Volumes (MOD)               LV E/e' medial:  9.4 LV vol d, MOD    76.3 ml       LV e' lateral:   5.44 cm/s A2C:                           LV E/e' lateral: 10.5 LV vol d, MOD    83.0 ml A4C: LV vol s, MOD    43.7 ml A2C: LV vol s, MOD    40.5 ml A4C: LV SV MOD A2C:   32.6 ml LV SV MOD A4C:   83.0 ml LV SV MOD BP:    42.7 ml RIGHT VENTRICLE RV Basal diam:  3.95 cm RV Mid diam:    4.00 cm RV S prime:     20.30 cm/s LEFT ATRIUM             Index        RIGHT ATRIUM           Index LA diam:        4.00 cm 1.77 cm/m   RA Area:     14.40 cm LA Vol (A2C):   32.2 ml 14.24 ml/m  RA Volume:   37.00 ml  16.37 ml/m LA Vol (A4C):   36.5 ml 16.15 ml/m LA Biplane Vol: 33.2 ml 14.69 ml/m  AORTIC VALVE                    PULMONIC VALVE AV Area (Vmax):    3.53 cm     PV Vmax:       0.93 m/s AV Area (Vmean):   3.77 cm     PV Peak grad:  3.5 mmHg AV Area (VTI):     3.99 cm AV Vmax:           89.80 cm/s AV Vmean:          53.000 cm/s AV VTI:            0.181 m AV Peak Grad:      3.2 mmHg AV Mean Grad:      1.0 mmHg LVOT Vmax:         83.40 cm/s LVOT Vmean:        52.600 cm/s LVOT VTI:          0.190 m LVOT/AV VTI ratio: 1.05  AORTA Ao Root diam: 3.70 cm Ao Asc diam:  3.40 cm MITRAL VALVE MV Area (PHT): 4.39 cm    SHUNTS MV Area VTI:   4.17 cm    Systemic VTI:  0.19 m MV Peak grad:  4.2 mmHg    Systemic Diam: 2.20 cm MV  Mean grad:  1.0 mmHg MV Vmax:       1.03 m/s MV Vmean:      54.2 cm/s MV Decel Time: 173 msec MV E velocity: 57.30 cm/s MV A velocity: 92.60 cm/s MV E/A ratio:  0.62 Windell Norfolk Electronically signed by Windell Norfolk Signature  Date/Time: 05/15/2023/4:47:13 PM    Final    CARDIAC CATHETERIZATION  Result Date: 05/15/2023   Prox LAD to Mid LAD lesion is 10% stenosed.   Dist LAD lesion is 25% stenosed.   Prox RCA to Mid RCA lesion is 100% stenosed.   1st Diag-1 lesion is 50% stenosed.   1st Diag-2 lesion is 100% stenosed.   Non-stenotic Mid Cx lesion was previously treated.   Balloon angioplasty was performed using a BALLN MINITREK RX 1.5X12.   Post intervention, there is a 25% residual stenosis.   There is mild left ventricular systolic dysfunction.   LV end diastolic pressure is mildly elevated.   The left ventricular ejection fraction is 45-50% by visual estimate.   Anticipated discharge date to be determined.   Recommend uninterrupted dual antiplatelet therapy with Aspirin 81mg  daily and Clopidogrel 75mg  daily for a minimum of 12 months (ACS-Class I recommendation). Conclusion Urgent diagnostic cath with abnormal EKG concerning for possible STEMI.  Patient had slight ST elevation in 1 and now reciprocal depression in 2 3 and F concerning for lateral STEMI/acute coronary syndrome Patient was seen by me you still having chest pain so we elected to bring the patient to the cardiac Cath Lab Right groin approach Left ventriculogram Mildly reduced left ventricular function EF around 45 to 50% with anterior lateral hypokinesis Left ventriculogram Coronaries Left main large 25 to 50% ostial lesion TIMI-3 flow LAD large widely patent proximal to mid stent minor irregularities Diagonal 1 moderate in size 100% occluded proximally IRA TIMI 0 flow jailed by the LAD stent IRA Circumflex large widely patent mid stent only minor irregularities RCA 100% occluded proximally CTO moderate collaterals left to right Intervention  Successful PCI of diagonal 1 balloon 1.5 x 12 mm to 14 atm Lesion reduced from 100 down to 25% TIMI 0 to TIMI-3 flow Left main evaluated by IVUS found to have area of 6.3 which was thought to be nonsignificant Unable to successfully deploy stent Patient placed on aspirin Plavix long-term Have the patient follow-up with cardiology as an outpatient  DG Chest Portable 1 View  Result Date: 05/15/2023 CLINICAL DATA:  Chest pain, resolved EXAM: PORTABLE CHEST 1 VIEW COMPARISON:  09/19/2020 FINDINGS: Normal heart size and mediastinal contours. No acute infiltrate or edema. No effusion or pneumothorax. No acute osseous findings. IMPRESSION: Negative low volume chest. Electronically Signed   By: Tiburcio Pea M.D.   On: 05/15/2023 05:06      Time coordinating discharge: over 30 minutes  SIGNED:  Sunnie Nielsen DO [facility]

## 2023-05-22 DIAGNOSIS — R7303 Prediabetes: Secondary | ICD-10-CM | POA: Diagnosis not present

## 2023-05-22 DIAGNOSIS — I2510 Atherosclerotic heart disease of native coronary artery without angina pectoris: Secondary | ICD-10-CM | POA: Diagnosis not present

## 2023-05-22 DIAGNOSIS — R5383 Other fatigue: Secondary | ICD-10-CM | POA: Diagnosis not present

## 2023-05-22 DIAGNOSIS — E782 Mixed hyperlipidemia: Secondary | ICD-10-CM | POA: Diagnosis not present

## 2023-05-22 DIAGNOSIS — I10 Essential (primary) hypertension: Secondary | ICD-10-CM | POA: Diagnosis not present

## 2023-05-22 DIAGNOSIS — K219 Gastro-esophageal reflux disease without esophagitis: Secondary | ICD-10-CM | POA: Diagnosis not present

## 2023-05-22 DIAGNOSIS — Z955 Presence of coronary angioplasty implant and graft: Secondary | ICD-10-CM | POA: Diagnosis not present

## 2023-05-22 DIAGNOSIS — I214 Non-ST elevation (NSTEMI) myocardial infarction: Secondary | ICD-10-CM | POA: Diagnosis not present

## 2023-06-05 ENCOUNTER — Encounter: Payer: Self-pay | Admitting: Dermatology

## 2023-06-05 ENCOUNTER — Ambulatory Visit: Payer: Medicare Other | Admitting: Dermatology

## 2023-06-05 DIAGNOSIS — W908XXA Exposure to other nonionizing radiation, initial encounter: Secondary | ICD-10-CM | POA: Diagnosis not present

## 2023-06-05 DIAGNOSIS — M71371 Other bursal cyst, right ankle and foot: Secondary | ICD-10-CM | POA: Diagnosis not present

## 2023-06-05 DIAGNOSIS — D492 Neoplasm of unspecified behavior of bone, soft tissue, and skin: Secondary | ICD-10-CM

## 2023-06-05 DIAGNOSIS — D1801 Hemangioma of skin and subcutaneous tissue: Secondary | ICD-10-CM

## 2023-06-05 DIAGNOSIS — Z1283 Encounter for screening for malignant neoplasm of skin: Secondary | ICD-10-CM | POA: Diagnosis not present

## 2023-06-05 DIAGNOSIS — L821 Other seborrheic keratosis: Secondary | ICD-10-CM

## 2023-06-05 DIAGNOSIS — L814 Other melanin hyperpigmentation: Secondary | ICD-10-CM

## 2023-06-05 DIAGNOSIS — L578 Other skin changes due to chronic exposure to nonionizing radiation: Secondary | ICD-10-CM | POA: Diagnosis not present

## 2023-06-05 DIAGNOSIS — L820 Inflamed seborrheic keratosis: Secondary | ICD-10-CM | POA: Diagnosis not present

## 2023-06-05 DIAGNOSIS — L570 Actinic keratosis: Secondary | ICD-10-CM

## 2023-06-05 DIAGNOSIS — D229 Melanocytic nevi, unspecified: Secondary | ICD-10-CM

## 2023-06-05 DIAGNOSIS — L918 Other hypertrophic disorders of the skin: Secondary | ICD-10-CM

## 2023-06-05 MED ORDER — MUPIROCIN 2 % EX OINT: TOPICAL_OINTMENT | CUTANEOUS | 1 refills | Status: DC

## 2023-06-05 NOTE — Patient Instructions (Addendum)
Cryotherapy Aftercare  Wash gently with soap and water everyday.   Apply Vaseline and Band-Aid daily until healed.   Wound Care Instructions  Cleanse wound gently with soap and water once a day then pat dry with clean gauze. Apply a thin coat of Petrolatum (petroleum jelly, "Vaseline") over the wound (unless you have an allergy to this). We recommend that you use a new, sterile tube of Vaseline. Do not pick or remove scabs. Do not remove the yellow or white "healing tissue" from the base of the wound.  Cover the wound with fresh, clean, nonstick gauze and secure with paper tape. You may use Band-Aids in place of gauze and tape if the wound is small enough, but would recommend trimming much of the tape off as there is often too much. Sometimes Band-Aids can irritate the skin.  You should call the office for your biopsy report after 1 week if you have not already been contacted.  If you experience any problems, such as abnormal amounts of bleeding, swelling, significant bruising, significant pain, or evidence of infection, please call the office immediately.  FOR ADULT SURGERY PATIENTS: If you need something for pain relief you may take 1 extra strength Tylenol (acetaminophen) AND 2 Ibuprofen (200mg  each) together every 4 hours as needed for pain. (do not take these if you are allergic to them or if you have a reason you should not take them.) Typically, you may only need pain medication for 1 to 3 days.      Recommend daily broad spectrum sunscreen SPF 30+ to sun-exposed areas, reapply every 2 hours as needed. Call for new or changing lesions.  Staying in the shade or wearing long sleeves, sun glasses (UVA+UVB protection) and wide brim hats (4-inch brim around the entire circumference of the hat) are also recommended for sun protection.    Melanoma ABCDEs  Melanoma is the most dangerous type of skin cancer, and is the leading cause of death from skin disease.  You are more likely to develop  melanoma if you: Have light-colored skin, light-colored eyes, or red or blond hair Spend a lot of time in the sun Tan regularly, either outdoors or in a tanning bed Have had blistering sunburns, especially during childhood Have a close family member who has had a melanoma Have atypical moles or large birthmarks  Early detection of melanoma is key since treatment is typically straightforward and cure rates are extremely high if we catch it early.   The first sign of melanoma is often a change in a mole or a new dark spot.  The ABCDE system is a way of remembering the signs of melanoma.  A for asymmetry:  The two halves do not match. B for border:  The edges of the growth are irregular. C for color:  A mixture of colors are present instead of an even brown color. D for diameter:  Melanomas are usually (but not always) greater than 6mm - the size of a pencil eraser. E for evolution:  The spot keeps changing in size, shape, and color.  Please check your skin once per month between visits. You can use a small mirror in front and a large mirror behind you to keep an eye on the back side or your body.   If you see any new or changing lesions before your next follow-up, please call to schedule a visit.  Please continue daily skin protection including broad spectrum sunscreen SPF 30+ to sun-exposed areas, reapplying every 2 hours  as needed when you're outdoors.   Staying in the shade or wearing long sleeves, sun glasses (UVA+UVB protection) and wide brim hats (4-inch brim around the entire circumference of the hat) are also recommended for sun protection.    Due to recent changes in healthcare laws, you may see results of your pathology and/or laboratory studies on MyChart before the doctors have had a chance to review them. We understand that in some cases there may be results that are confusing or concerning to you. Please understand that not all results are received at the same time and often the  doctors may need to interpret multiple results in order to provide you with the best plan of care or course of treatment. Therefore, we ask that you please give [place] 2 business days to thoroughly review all your results before contacting the office for clarification. Should we see a critical lab result, you will be contacted sooner.   If You Need Anything After Your Visit  If you have any questions or concerns for your doctor, please call our main line at [phone] and press option 4 to reach your doctor's medical assistant. If no one answers, please leave a voicemail as directed and we will return your call as soon as possible. Messages left after 4 pm will be answered the following business day.   You may also send [place] a message via MyChart. We typically respond to MyChart messages within 1-2 business days.  For prescription refills, please ask your pharmacy to contact our office. Our fax number is [phone].  If you have an urgent issue when the clinic is closed that cannot wait until the next business day, you can page your doctor at the number below.    Please note that while we do our best to be available for urgent issues outside of office hours, we are not available 24/7.   If you have an urgent issue and are unable to reach [place], you may choose to seek medical care at your doctor's office, retail clinic, urgent care center, or emergency room.  If you have a medical emergency, please immediately call 911 or go to the emergency department.  Pager Numbers  - Dr. Gwen Pounds: [phone]  - Dr. Roseanne Reno: [phone]  - Dr. Katrinka Blazing: [phone]   In the event of inclement weather, please call our main line at [phone] for an update on the status of any delays or closures.  Dermatology Medication Tips: Please keep the boxes that topical medications come in in order to help keep track of the instructions about where and how to use these. Pharmacies typically print the medication  instructions only on the boxes and not directly on the medication tubes.   If your medication is too expensive, please contact our office at [phone] option 4 or send [place] a message through MyChart.   We are unable to tell what your co-pay for medications will be in advance as this is different depending on your insurance coverage. However, we may be able to find a substitute medication at lower cost or fill out paperwork to get insurance to cover a needed medication.   If a prior authorization is required to get your medication covered by your insurance company, please allow [place] 1-2 business days to complete this process.  Drug prices often vary depending on where the prescription is filled and some pharmacies may offer cheaper prices.  The website www.goodrx.com contains coupons for medications through different pharmacies. The prices here do not account for  what the cost may be with help from insurance (it may be cheaper with your insurance), but the website can give you the price if you did not use any insurance.  - You can print the associated coupon and take it with your prescription to the pharmacy.  - You may also stop by our office during regular business hours and pick up a GoodRx coupon card.  - If you need your prescription sent electronically to a different pharmacy, notify our office through [facility] or by phone at [phone] option 4.     Si Usted Necesita Algo Despus de Su Visita  Tambin puede enviarnos un mensaje a travs de Clinical cytogeneticist. Por lo general respondemos a los mensajes de MyChart en el transcurso de 1 a 2 das hbiles.  Para renovar recetas, por favor pida a su farmacia que se ponga en contacto con nuestra oficina. Annie Sable de fax es [place] [phone].  Si tiene un asunto urgente cuando la clnica est cerrada y que no puede esperar hasta el siguiente da hbil, puede llamar/localizar a su doctor(a) al nmero que aparece a continuacin.   Por  favor, tenga en cuenta que aunque hacemos todo lo posible para estar disponibles para asuntos urgentes fuera del horario de [place], no estamos disponibles las 24 horas del da, los 7 [address]  [address] de la [place].   Si tiene un problema urgente y no puede comunicarse con nosotros, puede optar por buscar atencin mdica  en el consultorio de su doctor(a), en una clnica privada, en un centro de atencin urgente o en una sala de emergencias.  Si tiene Engineer, drilling, por favor llame inmediatamente al 911 o vaya a la sala de emergencias.  Nmeros de bper  - Dr. Gwen Pounds: [phone]  - Dra. Roseanne Reno: [phone]  - Dr. Katrinka Blazing: [phone]   En caso de inclemencias del tiempo, por favor llame a Lacy Duverney principal al [phone] para una actualizacin sobre el [place] de cualquier retraso o cierre.  Consejos para la medicacin en dermatologa: Por favor, guarde las cajas en las que vienen los medicamentos de uso tpico para ayudarle a seguir las instrucciones sobre dnde y cmo usarlos. Las farmacias generalmente imprimen las instrucciones del medicamento slo en las cajas y no directamente en los tubos del [place].   Si su medicamento es muy caro, por favor, pngase en contacto con Rolm Gala llamando al [phone] y presione la opcin 4 o envenos un mensaje a travs de Clinical cytogeneticist.   No podemos decirle cul ser su copago por los medicamentos por adelantado ya que esto es diferente dependiendo de la cobertura de su seguro. Sin embargo, es posible que podamos encontrar un medicamento sustituto a Audiological scientist un formulario para que el seguro cubra el medicamento que se considera necesario.   Si se requiere una autorizacin previa para que su compaa de seguros [place] su medicamento, por favor permtanos de 1 a 2 das hbiles para completar [address]  Los precios de los medicamentos varan con frecuencia dependiendo del Environmental consultant de dnde se surte la receta y alguna farmacias  pueden ofrecer precios ms baratos.  El sitio web www.goodrx.com tiene cupones para medicamentos de Health and safety inspector. Los precios aqu no tienen en cuenta lo que podra costar con la ayuda del seguro (puede ser ms barato con su seguro), pero el sitio web puede darle el precio si no utiliz Tourist information centre manager.  - Puede imprimir el cupn correspondiente y llevarlo con su receta a la farmacia.  Laroy Apple  puede pasar por nuestra oficina durante el horario de atencin regular y Education officer, museum una tarjeta de cupones de GoodRx.  - Si necesita que su receta se enve electrnicamente a una farmacia diferente, informe a nuestra oficina a travs de MyChart de [place] o por telfono llamando al [phone] y presione la opcin 4.

## 2023-06-05 NOTE — Progress Notes (Signed)
Follow-Up Visit   Subjective  STORY BRAKEMAN is a 72 y.o. male who presents for the following: Skin Cancer Screening and Full Body Skin Exam. No personal hx of skin cancer or dysplastic nevi.   The patient presents for Total-Body Skin Exam (TBSE) for skin cancer screening and mole check. The patient has spots, moles and lesions to be evaluated, some may be new or changing and the patient may have concern these could be cancer.  The following portions of the chart were reviewed this encounter and updated as appropriate: medications, allergies, medical history  Review of Systems:  No other skin or systemic complaints except as noted in HPI or Assessment and Plan.  Objective  Well appearing patient in no apparent distress; mood and affect are within normal limits.  A full examination was performed including scalp, head, eyes, ears, nose, lips, neck, chest, axillae, abdomen, back, buttocks, bilateral upper extremities, bilateral lower extremities, hands, feet, fingers, toes, fingernails, and toenails. All findings within normal limits unless otherwise noted below.   Relevant physical exam findings are noted in the Assessment and Plan.  Right Lower Back x1, R forearm x1 (2) Erythematous keratotic or waxy stuck-on papule or plaque.  Scalp and face x8 (8) Erythematous thin papules/macules with gritty scale.   Right medial great toe 1.1 cm cystic nodule with central erosion        Assessment & Plan   SKIN CANCER SCREENING PERFORMED TODAY.  ACTINIC DAMAGE - Chronic condition, secondary to cumulative UV/sun exposure - diffuse scaly erythematous macules with underlying dyspigmentation - Recommend daily broad spectrum sunscreen SPF 30+ to sun-exposed areas, reapply every 2 hours as needed.  - Staying in the shade or wearing long sleeves, sun glasses (UVA+UVB protection) and wide brim hats (4-inch brim around the entire circumference of the hat) are also recommended for sun protection.   - Call for new or changing lesions.  LENTIGINES, SEBORRHEIC KERATOSES, HEMANGIOMAS - Benign normal skin lesions - Benign-appearing - Call for any changes  MELANOCYTIC NEVI - Tan-brown and/or pink-flesh-colored symmetric macules and papules - Benign appearing on exam today - Observation - Call clinic for new or changing moles - Recommend daily use of broad spectrum spf 30+ sunscreen to sun-exposed areas.   Acrochordons (Skin Tags) - Fleshy, skin-colored pedunculated papules - Benign appearing.  - Observe. - If desired, they can be removed with an in office procedure that is not covered by insurance. - Please call the clinic if you notice any new or changing lesions.   Inflamed seborrheic keratosis (2) Right Lower Back x1, R forearm x1  Symptomatic, irritating, patient would like treated.  Destruction of lesion - Right Lower Back x1, R forearm x1 (2) Complexity: simple   Destruction method: cryotherapy   Informed consent: discussed and consent obtained   Timeout:  patient name, date of birth, surgical site, and procedure verified Lesion destroyed using liquid nitrogen: Yes   Region frozen until ice ball extended beyond lesion: Yes   Outcome: patient tolerated procedure well with no complications   Post-procedure details: wound care instructions given   Additional details:  Prior to procedure, discussed risks of blister formation, small wound, skin dyspigmentation, or rare scar following cryotherapy. Recommend Vaseline ointment to treated areas while healing.   AK (actinic keratosis) (8) Scalp and face x8  Actinic keratoses are precancerous spots that appear secondary to cumulative UV radiation exposure/sun exposure over time. They are chronic with expected duration over 1 year. A portion of actinic keratoses will  progress to squamous cell carcinoma of the skin. It is not possible to reliably predict which spots will progress to skin cancer and so treatment is recommended to  prevent development of skin cancer.  Recommend daily broad spectrum sunscreen SPF 30+ to sun-exposed areas, reapply every 2 hours as needed.  Recommend staying in the shade or wearing long sleeves, sun glasses (UVA+UVB protection) and wide brim hats (4-inch brim around the entire circumference of the hat). Call for new or changing lesions.  Destruction of lesion - Scalp and face x8 (8) Complexity: simple   Destruction method: cryotherapy   Informed consent: discussed and consent obtained   Timeout:  patient name, date of birth, surgical site, and procedure verified Lesion destroyed using liquid nitrogen: Yes   Region frozen until ice ball extended beyond lesion: Yes   Outcome: patient tolerated procedure well with no complications   Post-procedure details: wound care instructions given    Neoplasm of skin Right medial great toe  mupirocin ointment (BACTROBAN) 2 % Apply daily with bandage change  Skin excision  Lesion length (cm):  1.1 Total excision diameter (cm):  1.1 Informed consent: discussed and consent obtained   Timeout: patient name, date of birth, surgical site, and procedure verified   Procedure prep:  Patient was prepped and draped in usual sterile fashion Prep type:  Isopropyl alcohol and povidone-iodine Anesthesia: the lesion was anesthetized in a standard fashion   Anesthetic:  1% lidocaine w/ epinephrine 1-100,000 buffered w/ 8.4% NaHCO3 Instrument used: #15 blade   Hemostasis achieved with: pressure and electrodesiccation   Outcome: patient tolerated procedure well with no complications   Post-procedure details: sterile dressing applied and wound care instructions given   Dressing type: bandage and pressure dressing (mupirocin)    Specimen 1 - Surgical pathology Differential Diagnosis: Digital mucous cyst vs other  Check Margins: No  Start mupirocin ointment daily with bandage change.   Return in about 1 year (around 06/04/2024) for TBSE, HxAKs .  I,  Lawson Radar, CMA, am acting as scribe for Armida Sans, MD.   Documentation: I have reviewed the above documentation for accuracy and completeness, and I agree with the above.  Armida Sans, MD

## 2023-06-06 ENCOUNTER — Encounter: Payer: Medicare Other | Attending: Internal Medicine | Admitting: *Deleted

## 2023-06-06 DIAGNOSIS — Z48812 Encounter for surgical aftercare following surgery on the circulatory system: Secondary | ICD-10-CM | POA: Insufficient documentation

## 2023-06-06 DIAGNOSIS — Z955 Presence of coronary angioplasty implant and graft: Secondary | ICD-10-CM | POA: Insufficient documentation

## 2023-06-06 DIAGNOSIS — Z713 Dietary counseling and surveillance: Secondary | ICD-10-CM | POA: Insufficient documentation

## 2023-06-06 DIAGNOSIS — I213 ST elevation (STEMI) myocardial infarction of unspecified site: Secondary | ICD-10-CM | POA: Insufficient documentation

## 2023-06-06 NOTE — Progress Notes (Signed)
Initial phone call completed. Diagnosis can be found in [facility] 10/30. EP Orientation scheduled for Monday 11/25 at 10:30am.

## 2023-06-07 LAB — SURGICAL PATHOLOGY

## 2023-06-10 ENCOUNTER — Telehealth: Payer: Self-pay

## 2023-06-10 VITALS — Ht 73.4 in | Wt 219.4 lb

## 2023-06-10 DIAGNOSIS — I213 ST elevation (STEMI) myocardial infarction of unspecified site: Secondary | ICD-10-CM | POA: Diagnosis not present

## 2023-06-10 DIAGNOSIS — Z955 Presence of coronary angioplasty implant and graft: Secondary | ICD-10-CM | POA: Diagnosis not present

## 2023-06-10 DIAGNOSIS — Z48812 Encounter for surgical aftercare following surgery on the circulatory system: Secondary | ICD-10-CM | POA: Diagnosis not present

## 2023-06-10 DIAGNOSIS — Z713 Dietary counseling and surveillance: Secondary | ICD-10-CM | POA: Diagnosis not present

## 2023-06-10 NOTE — Patient Instructions (Signed)
Patient Instructions  Patient Details  Name: Derrick Stone MRN: [id number] Date of Birth: 01-03-1951 Referring Provider:  Alwyn Pea, MD  Below are your personal goals for exercise, nutrition, and risk factors. Our goal is to help you stay on track towards obtaining and maintaining these goals. We will be discussing your progress on these goals with you throughout the program.  Initial Exercise Prescription:  Initial Exercise Prescription - 06/10/23 1100       Date of Initial Exercise RX and Referring Provider   Date 06/10/23    Referring Provider Dr. Dorothyann Peng      Oxygen   Maintain Oxygen Saturation 88% or higher      Treadmill   MPH 2.8    Grade 1    Minutes 15    METs 3.53      Elliptical   Level 1    Speed 1    Minutes 15    METs 3.11      REL-XR   Level 3    Speed 50    Minutes 15    METs 3.11      Prescription Details   Duration Progress to 30 minutes of continuous aerobic without signs/symptoms of physical distress      Intensity   THRR 40-80% of Max Heartrate 110-136    Ratings of Perceived Exertion 11-13    Perceived Dyspnea 0-4      Progression   Progression Continue to progress workloads to maintain intensity without signs/symptoms of physical distress.      Resistance Training   Training Prescription Yes    Weight 5 lb    Reps 10-15             Exercise Goals: Frequency: Be able to perform aerobic exercise two to three times per week in program working toward 2-5 days per week of home exercise.  Intensity: Work with a perceived exertion of 11 (fairly light) - 15 (hard) while following your exercise prescription.  We will make changes to your prescription with you as you progress through the program.   Duration: Be able to do 30 to 45 minutes of continuous aerobic exercise in addition to a 5 minute warm-up and a 5 minute cool-down routine.   Nutrition Goals: Your personal nutrition goals will be established when you do your  nutrition analysis with the dietician.  The following are general nutrition guidelines to follow: Cholesterol < 200mg /day Sodium < 1500mg /day Fiber: Men over 50 yrs - 30 grams per day  Personal Goals:  Personal Goals and Risk Factors at Admission - 06/10/23 1155       Core Components/Risk Factors/Patient Goals on Admission    Weight Management Weight Loss    Hypertension Yes    Intervention Provide education on lifestyle modifcations including regular physical activity/exercise, weight management, moderate sodium restriction and increased consumption of fresh fruit, vegetables, and low fat dairy, alcohol moderation, and smoking cessation.;Monitor prescription use compliance.    Expected Outcomes Short Term: Continued assessment and intervention until BP is < 140/51mm HG in hypertensive participants. < 130/61mm HG in hypertensive participants with diabetes, heart failure or chronic kidney disease.;Long Term: Maintenance of blood pressure at goal levels.    Lipids Yes    Intervention Provide education and support for participant on nutrition & aerobic/resistive exercise along with prescribed medications to achieve LDL 70mg , HDL >40mg .    Expected Outcomes Short Term: Participant states understanding of desired cholesterol values and is compliant with medications prescribed. Participant is  following exercise prescription and nutrition guidelines.;Long Term: Cholesterol controlled with medications as prescribed, with individualized exercise RX and with personalized nutrition plan. Value goals: LDL < 70mg , HDL > 40 mg.             Exercise Goals and Review:  Exercise Goals     Row Name 06/10/23 1154             Exercise Goals   Increase Physical Activity Yes       Expected Outcomes Long Term: Exercising regularly at least 3-5 days a week.;Long Term: Add in home exercise to make exercise part of routine and to increase amount of physical activity.;Short Term: Attend rehab on a regular  basis to increase amount of physical activity.       Increase Strength and Stamina Yes       Intervention Provide advice, education, support and counseling about physical activity/exercise needs.;Develop an individualized exercise prescription for aerobic and resistive training based on initial evaluation findings, risk stratification, comorbidities and participant's personal goals.       Expected Outcomes Long Term: Improve cardiorespiratory fitness, muscular endurance and strength as measured by increased METs and functional capacity ( );Short Term: Perform resistance training exercises routinely during rehab and add in resistance training at home;Short Term: Increase workloads from initial exercise prescription for resistance, speed, and METs.       Able to understand and use rate of perceived exertion (RPE) scale Yes       Intervention Provide education and explanation on how to use RPE scale       Expected Outcomes Long Term:  Able to use RPE to guide intensity level when exercising independently;Short Term: Able to use RPE daily in rehab to express subjective intensity level       Able to understand and use Dyspnea scale Yes       Intervention Provide education and explanation on how to use Dyspnea scale       Expected Outcomes Long Term: Able to use Dyspnea scale to guide intensity level when exercising independently;Short Term: Able to use Dyspnea scale daily in rehab to express subjective sense of shortness of breath during exertion       Knowledge and understanding of Target Heart Rate Range (THRR) Yes       Intervention Provide education and explanation of THRR including how the numbers were predicted and where they are located for reference       Expected Outcomes Long Term: Able to use THRR to govern intensity when exercising independently;Short Term: Able to use daily as guideline for intensity in rehab;Short Term: Able to state/look up THRR       Able to check pulse independently Yes        Intervention Review the importance of being able to check your own pulse for safety during independent exercise;Provide education and demonstration on how to check pulse in carotid and radial arteries.       Expected Outcomes Long Term: Able to check pulse independently and accurately;Short Term: Able to explain why pulse checking is important during independent exercise       Understanding of Exercise Prescription Yes       Intervention Provide education, explanation, and written materials on patient's individual exercise prescription       Expected Outcomes Long Term: Able to explain home exercise prescription to exercise independently;Short Term: Able to explain program exercise prescription                Copy  of goals given to participant.

## 2023-06-10 NOTE — Progress Notes (Signed)
Assessment start time: 11:22 AM  Digestive issues/concerns: no known food allergies   24-hours Recall: B: [place] sausage egg and cheese, english muffin, orange juice L: banana sandwich D: grilled chicken sandwich  Beverages 1 sprite per day, 4-5 bottles of water per day, 1 orange juice per day Alcohol none Caffeine none  Supplements none Intake Patterns doesn't eat much, sometimes with just have a fruit for lunch. Does like sweets.  Pt eats away from home 4 times a week.   Education r/t nutrition plan Patient drinking adequate water. ~64oz per day. He was drinking sprite and orange juice as well. After speaking with him he will quit drinking sugary beverages in favor of plain water. Educated him on types of carbs and quantity control with recommended portions of ~30-60g each meal. He reports sometimes he is not very hungry. Explained the importance of protein carb pairing. Brainstormed some smaller easy to make meals and snacks for him to try. Reviewed Mediterranean diet handout, educated on types of fats, sources, and how to read facts label. Built out several meals and snacks with foods he likes and will eat. Went over several of his preferred foods and recommended changes he could try to implement to improve his heart health.     Goal 1: Eat 15-30gProtein and 30-60gCarbs at each meal. Goal 2: Drink 64oz of water, cut out sugary beverages Goal 3: Reduce saturated fat, less than 12g per day. Replace bad fats for more heart healthy fats.   End time 12:34 AM

## 2023-06-10 NOTE — Telephone Encounter (Addendum)
Called and discussed bx results with patient. He verbalized understanding and denied further questions.    ----- Message from Armida Sans sent at 06/10/2023  4:53 PM EST ----- FINAL DIAGNOSIS        1. Skin, right medial great toe :       DIGITAL MUCOUS CYST, CRUSTED   Benign digital mucous cyst As suspected May recur

## 2023-06-10 NOTE — Progress Notes (Signed)
Cardiac Individual Treatment Plan  Patient Details  Name: Derrick Stone MRN: [id number] Date of Birth: [birth year] Referring Provider:   Flowsheet Row Cardiac Rehab from 06/10/2023 in [facility] Cardiac [facility] Rehab  Referring Provider Dr. Dorothyann Peng       Initial Encounter Date:  Flowsheet Row Cardiac Rehab from 06/10/2023 in [facility] Cardiac and Pulmonary Rehab  Date 06/10/23       Visit Diagnosis: ST elevation myocardial infarction (STEMI), unspecified artery (HCC)  Patient's Home Medications on Admission:  Current Outpatient Medications:    aspirin EC 81 MG tablet, Take 81 mg by mouth daily., Disp: , Rfl:    atorvastatin (LIPITOR) 80 MG tablet, Take 1 tablet (80 mg total) by mouth daily., Disp: 90 tablet, Rfl: 1   clopidogrel (PLAVIX) 75 MG tablet, Take 1 tablet (75 mg total) by mouth daily., Disp: 30 tablet, Rfl: 0   escitalopram (LEXAPRO) 10 MG tablet, TAKE 1 TABLET (10 MG TOTAL) BY MOUTH ONCE DAILY., Disp: , Rfl: 1   ezetimibe (ZETIA) 10 MG tablet, Take 1 tablet (10 mg total) by mouth daily., Disp: 90 tablet, Rfl: 3   losartan (COZAAR) 25 MG tablet, Take 1 tablet (25 mg total) by mouth daily. (Patient taking differently: Take 12.5 mg by mouth daily.), Disp: 30 tablet, Rfl: 1   metoprolol succinate (TOPROL-XL) 25 MG 24 hr tablet, Take 1 tablet by mouth daily., Disp: , Rfl:    mupirocin ointment (BACTROBAN) 2 %, Apply daily with bandage change, Disp: 22 g, Rfl: 1   Omega-3 Fatty Acids (FISH OIL PO), Take 1 capsule by mouth daily., Disp: , Rfl:    omeprazole (PRILOSEC OTC) 20 MG tablet, Take 1 tablet by mouth daily., Disp: , Rfl:   Past Medical History: Past Medical History:  Diagnosis Date   CAD (coronary artery disease)    History of chickenpox    History of mumps    Hyperlipidemia    Hypertension    NSTEMI (non-ST elevated myocardial infarction) (HCC) 09/19/2020    Tobacco Use: Social History   Tobacco Use  Smoking Status Never  Smokeless Tobacco Never     Labs: Review Flowsheet  More data exists      Latest Ref Rng & Units 09/20/2020 09/21/2020 10/25/2020 12/06/2021 01/30/2023  Labs for ITP Cardiac and Pulmonary Rehab  Cholestrol 100 - 199 mg/dL 852  - 778  242  353   LDL (calc) 0 - 99 mg/dL 90  - 72  92  87   HDL-C >39 mg/dL 36  - 33  40  45   Trlycerides 0 - 149 mg/dL 92  - 614  431  540   Hemoglobin A1c 4.8 - 5.6 % - 6.1  - 6.1  6.0     Details             Exercise Target Goals: Exercise Program Goal: Individual exercise prescription set using results from initial 6 min walk test and THRR while considering  patient's activity barriers and safety.   Exercise Prescription Goal: Initial exercise prescription builds to 30-45 minutes a day of aerobic activity, 2-3 days per week.  Home exercise guidelines will be given to patient during program as part of exercise prescription that the participant will acknowledge.   Education: Aerobic Exercise: - Group verbal and visual presentation on the components of exercise prescription. Introduces F.I.T.T principle from ACSM for exercise prescriptions.  Reviews F.I.T.T. principles of aerobic exercise including progression. Written material given at graduation.   Education: Resistance Exercise: -  Group verbal and visual presentation on the components of exercise prescription. Introduces F.I.T.T principle from ACSM for exercise prescriptions  Reviews F.I.T.T. principles of resistance exercise including progression. Written material given at graduation.    Education: Exercise & Equipment Safety: - Individual verbal instruction and demonstration of equipment use and safety with use of the equipment.   Education: Exercise Physiology & General Exercise Guidelines: - Group verbal and written instruction with models to review the exercise physiology of the cardiovascular system and associated critical values. Provides general exercise guidelines with specific guidelines to those with heart or lung  disease.    Education: Flexibility, Balance, Mind/Body Relaxation: - Group verbal and visual presentation with interactive activity on the components of exercise prescription. Introduces F.I.T.T principle from ACSM for exercise prescriptions. Reviews F.I.T.T. principles of flexibility and balance exercise training including progression. Also discusses the mind body connection.  Reviews various relaxation techniques to help reduce and manage stress (i.e. Deep breathing, progressive muscle relaxation, and visualization). Balance handout provided to take home. Written material given at graduation.   Activity Barriers & Risk Stratification:  Activity Barriers & Cardiac Risk Stratification - 06/10/23 1149       Activity Barriers & Cardiac Risk Stratification   Activity Barriers Other (comment)    Comments 10 years ago- fell off ladder- sometimes left foot gets aggravated    Cardiac Risk Stratification High             6 Minute Walk:  6 Minute Walk     Row Name 06/10/23 1146         6 Minute Walk   Phase Initial     Distance 1470 feet     Walk Time 6 minutes     # of Rest Breaks 0     MPH 2.8     METS 3.11     RPE 7     Perceived Dyspnea  0     VO2 Peak 10.89     Symptoms No     Resting HR 84 bpm     Resting BP 116/66     Resting Oxygen Saturation  98 %     Exercise Oxygen Saturation  during 6 min walk 98 %     Max Ex. HR 98 bpm     Max Ex. BP 120/62     2 Minute Post BP 116/64              Oxygen Initial Assessment:   Oxygen Re-Evaluation:   Oxygen Discharge (Final Oxygen Re-Evaluation):   Initial Exercise Prescription:  Initial Exercise Prescription - 06/10/23 1100       Date of Initial Exercise RX and Referring Provider   Date 06/10/23    Referring Provider Dr. Dorothyann Peng      Oxygen   Maintain Oxygen Saturation 88% or higher      Treadmill   MPH 2.8    Grade 1    Minutes 15    METs 3.53      Elliptical   Level 1    Speed 1     Minutes 15    METs 3.11      REL-XR   Level 3    Speed 50    Minutes 15    METs 3.11      Prescription Details   Duration Progress to 30 minutes of continuous aerobic without signs/symptoms of physical distress      Intensity   THRR 40-80% of Max Heartrate 110-136  Ratings of Perceived Exertion 11-13    Perceived Dyspnea 0-4      Progression   Progression Continue to progress workloads to maintain intensity without signs/symptoms of physical distress.      Resistance Training   Training Prescription Yes    Weight 5 lb    Reps 10-15             Perform Capillary Blood Glucose checks as needed.  Exercise Prescription Changes:   Exercise Prescription Changes     Row Name 06/10/23 1100             Response to Exercise   Blood Pressure (Admit) 116/66       Blood Pressure (Exercise) 120/62       Blood Pressure (Exit) 116/64       Heart Rate (Admit) 84 bpm       Heart Rate (Exercise) 98 bpm       Heart Rate (Exit) 86 bpm       Oxygen Saturation (Admit) 98 %       Oxygen Saturation (Exercise) 98 %       Oxygen Saturation (Exit) 98 %       Rating of Perceived Exertion (Exercise) 7       Perceived Dyspnea (Exercise) 0       Symptoms none       Comments results         Progression   Average METs 3.11                Exercise Comments:   Exercise Goals and Review:   Exercise Goals     Row Name 06/10/23 1154             Exercise Goals   Increase Physical Activity Yes       Expected Outcomes Long Term: Exercising regularly at least 3-5 days a week.;Long Term: Add in home exercise to make exercise part of routine and to increase amount of physical activity.;Short Term: Attend rehab on a regular basis to increase amount of physical activity.       Increase Strength and Stamina Yes       Intervention Provide advice, education, support and counseling about physical activity/exercise needs.;Develop an individualized exercise prescription for  aerobic and resistive training based on initial evaluation findings, risk stratification, comorbidities and participant's personal goals.       Expected Outcomes Long Term: Improve cardiorespiratory fitness, muscular endurance and strength as measured by increased METs and functional capacity ( );Short Term: Perform resistance training exercises routinely during rehab and add in resistance training at home;Short Term: Increase workloads from initial exercise prescription for resistance, speed, and METs.       Able to understand and use rate of perceived exertion (RPE) scale Yes       Intervention Provide education and explanation on how to use RPE scale       Expected Outcomes Long Term:  Able to use RPE to guide intensity level when exercising independently;Short Term: Able to use RPE daily in rehab to express subjective intensity level       Able to understand and use Dyspnea scale Yes       Intervention Provide education and explanation on how to use Dyspnea scale       Expected Outcomes Long Term: Able to use Dyspnea scale to guide intensity level when exercising independently;Short Term: Able to use Dyspnea scale daily in rehab to express subjective sense of shortness of breath during exertion  Knowledge and understanding of Target Heart Rate Range (THRR) Yes       Intervention Provide education and explanation of THRR including how the numbers were predicted and where they are located for reference       Expected Outcomes Long Term: Able to use THRR to govern intensity when exercising independently;Short Term: Able to use daily as guideline for intensity in rehab;Short Term: Able to state/look up THRR       Able to check pulse independently Yes       Intervention Review the importance of being able to check your own pulse for safety during independent exercise;Provide education and demonstration on how to check pulse in carotid and radial arteries.       Expected Outcomes Long Term: Able  to check pulse independently and accurately;Short Term: Able to explain why pulse checking is important during independent exercise       Understanding of Exercise Prescription Yes       Intervention Provide education, explanation, and written materials on patient's individual exercise prescription       Expected Outcomes Long Term: Able to explain home exercise prescription to exercise independently;Short Term: Able to explain program exercise prescription                Exercise Goals Re-Evaluation :   Discharge Exercise Prescription (Final Exercise Prescription Changes):  Exercise Prescription Changes - 06/10/23 1100       Response to Exercise   Blood Pressure (Admit) 116/66    Blood Pressure (Exercise) 120/62    Blood Pressure (Exit) 116/64    Heart Rate (Admit) 84 bpm    Heart Rate (Exercise) 98 bpm    Heart Rate (Exit) 86 bpm    Oxygen Saturation (Admit) 98 %    Oxygen Saturation (Exercise) 98 %    Oxygen Saturation (Exit) 98 %    Rating of Perceived Exertion (Exercise) 7    Perceived Dyspnea (Exercise) 0    Symptoms none    Comments results      Progression   Average METs 3.11             Nutrition:  Target Goals: Understanding of nutrition guidelines, daily intake of sodium 1500mg , cholesterol 200mg , calories 30% from fat and 7% or less from saturated fats, daily to have 5 or more servings of fruits and vegetables.  Education: All About Nutrition: -Group instruction provided by verbal, written material, interactive activities, discussions, models, and posters to present general guidelines for heart healthy nutrition including fat, fiber, MyPlate, the role of sodium in heart healthy nutrition, utilization of the nutrition label, and utilization of this knowledge for meal planning. Follow up email sent as well. Written material given at graduation.   Biometrics:  Pre Biometrics - 06/10/23 1154       Pre Biometrics   Height 6' 1.4" (1.864 m)     Weight 219 lb 6.4 oz (99.5 kg)    Waist Circumference 42 inches    Hip Circumference 41 inches    Waist to Hip Ratio 1.02 %    BMI (Calculated) 28.64    Single Leg Stand 30 seconds              Nutrition Therapy Plan and Nutrition Goals:   Nutrition Assessments:  MEDIFICTS Score Key: >=70 Need to make dietary changes  40-70 Heart Healthy Diet <= 40 Therapeutic Level Cholesterol Diet   Picture Your Plate Scores: <40 Unhealthy dietary pattern with much room for improvement. 41-50 Dietary  pattern unlikely to meet recommendations for good health and room for improvement. 51-60 More healthful dietary pattern, with some room for improvement.  >60 Healthy dietary pattern, although there may be some specific behaviors that could be improved.    Nutrition Goals Re-Evaluation:   Nutrition Goals Discharge (Final Nutrition Goals Re-Evaluation):   Psychosocial: Target Goals: Acknowledge presence or absence of significant depression and/or stress, maximize coping skills, provide positive support system. Participant is able to verbalize types and ability to use techniques and skills needed for reducing stress and depression.   Education: Stress, Anxiety, and Depression - Group verbal and visual presentation to define topics covered.  Reviews how body is impacted by stress, anxiety, and depression.  Also discusses healthy ways to reduce stress and to treat/manage anxiety and depression.  Written material given at graduation.   Education: Sleep Hygiene -Provides group verbal and written instruction about how sleep can affect your health.  Define sleep hygiene, discuss sleep cycles and impact of sleep habits. Review good sleep hygiene tips.    Initial Review & Psychosocial Screening:  Initial Psych Review & Screening - 06/06/23 1340       Initial Review   Current issues with Current Stress Concerns    Source of Stress Concerns Chronic Illness    Comments 3rd MI      Family  Dynamics   Good Support System? Yes   wife, family     Barriers   Psychosocial barriers to participate in program There are no identifiable barriers or psychosocial needs.      Screening Interventions   Interventions Encouraged to exercise;Provide feedback about the scores to participant;To provide support and resources with identified psychosocial needs    Expected Outcomes Short Term goal: Utilizing psychosocial counselor, staff and physician to assist with identification of specific Stressors or current issues interfering with healing process. Setting desired goal for each stressor or current issue identified.;Long Term Goal: Stressors or current issues are controlled or eliminated.;Short Term goal: Identification and review with participant of any Quality of Life or Depression concerns found by scoring the questionnaire.;Long Term goal: The participant improves quality of Life and PHQ9 Scores as seen by post scores and/or verbalization of changes             Quality of Life Scores:   Scores of 19 and below usually indicate a poorer quality of life in these areas.  A difference of  2-3 points is a clinically meaningful difference.  A difference of 2-3 points in the total score of the Quality of Life Index has been associated with significant improvement in overall quality of life, self-image, physical symptoms, and general health in studies assessing change in quality of life.  PHQ-9: Review Flowsheet  More data exists      06/10/2023 01/30/2023 12/25/2022 12/06/2021 10/24/2020  Depression screen PHQ 2/9  Decreased Interest 0 0 0 0 0  Down, Depressed, Hopeless 0 0 0 0 0  PHQ - 2 Score 0 0 0 0 0  Altered sleeping 0 - - 0 0  Tired, decreased energy 1 - - 0 0  Change in appetite 0 - - 0 0  Feeling bad or failure about yourself  0 - - 0 0  Trouble concentrating 0 - - 0 0  Moving slowly or fidgety/restless 0 - - 0 0  Suicidal thoughts 0 - - 0 0  PHQ-9 Score 1 - - 0 0  Difficult doing  work/chores Not difficult at all - - Not difficult  at all Not difficult at all    Details           Interpretation of Total Score  Total Score Depression Severity:  1-4 = Minimal depression, 5-9 = Mild depression, 10-14 = Moderate depression, 15-19 = Moderately severe depression, 20-27 = Severe depression   Psychosocial Evaluation and Intervention:  Psychosocial Evaluation - 06/06/23 1349       Psychosocial Evaluation & Interventions   Interventions Encouraged to exercise with the program and follow exercise prescription;Stress management education    Comments Kyeon is coming to cardiac rehab after his third MI. He and his wife are concerned about his health and he is very motivated to participate in the program. He enjoys golfing and spending time with his family. His wife and family are his main support system. He does take Tylenol pm at night to help him sleep and states this is still working for him, but he has been a restless sleeper for a while now. He is ready to get started to learn more about heart healthy living.    Expected Outcomes Short: attend cardiac rehab for education and exercise. Long: develop and maintain positive self care habits    Continue Psychosocial Services  Follow up required by staff             Psychosocial Re-Evaluation:   Psychosocial Discharge (Final Psychosocial Re-Evaluation):   Vocational Rehabilitation: Provide vocational rehab assistance to qualifying candidates.   Vocational Rehab Evaluation & Intervention:  Vocational Rehab - 06/06/23 1338       Initial Vocational Rehab Evaluation & Intervention   Assessment shows need for Vocational Rehabilitation No             Education: Education Goals: Education classes will be provided on a variety of topics geared toward better understanding of heart health and risk factor modification. Participant will state understanding/return demonstration of topics presented as noted by education  test scores.  Learning Barriers/Preferences:  Learning Barriers/Preferences - 06/06/23 1338       Learning Barriers/Preferences   Learning Barriers None    Learning Preferences None             General Cardiac Education Topics:  AED/CPR: - Group verbal and written instruction with the use of models to demonstrate the basic use of the AED with the basic ABC's of resuscitation.   Anatomy and Cardiac Procedures: - Group verbal and visual presentation and models provide information about basic cardiac anatomy and function. Reviews the testing methods done to diagnose heart disease and the outcomes of the test results. Describes the treatment choices: Medical Management, Angioplasty, or Coronary Bypass Surgery for treating various heart conditions including Myocardial Infarction, Angina, Valve Disease, and Cardiac Arrhythmias.  Written material given at graduation.   Medication Safety: - Group verbal and visual instruction to review commonly prescribed medications for heart and lung disease. Reviews the medication, class of the drug, and side effects. Includes the steps to properly store meds and maintain the prescription regimen.  Written material given at graduation.   Intimacy: - Group verbal instruction through game format to discuss how heart and lung disease can affect sexual intimacy. Written material given at graduation..   Know Your Numbers and Heart Failure: - Group verbal and visual instruction to discuss disease risk factors for cardiac and pulmonary disease and treatment options.  Reviews associated critical values for Overweight/Obesity, Hypertension, Cholesterol, and Diabetes.  Discusses basics of heart failure: signs/symptoms and treatments.  Introduces Heart Failure Zone chart for  action plan for heart failure.  Written material given at graduation.   Infection Prevention: - Provides verbal and written material to individual with discussion of infection control  including proper hand washing and proper equipment cleaning during exercise session.   Falls Prevention: - Provides verbal and written material to individual with discussion of falls prevention and safety.   Other: -Provides group and verbal instruction on various topics (see comments)   Knowledge Questionnaire Score:   Core Components/Risk Factors/Patient Goals at Admission:  Personal Goals and Risk Factors at Admission - 06/10/23 1155       Core Components/Risk Factors/Patient Goals on Admission    Weight Management Weight Loss    Hypertension Yes    Intervention Provide education on lifestyle modifcations including regular physical activity/exercise, weight management, moderate sodium restriction and increased consumption of fresh fruit, vegetables, and low fat dairy, alcohol moderation, and smoking cessation.;Monitor prescription use compliance.    Expected Outcomes Short Term: Continued assessment and intervention until BP is < 140/52mm HG in hypertensive participants. < 130/5mm HG in hypertensive participants with diabetes, heart failure or chronic kidney disease.;Long Term: Maintenance of blood pressure at goal levels.    Lipids Yes    Intervention Provide education and support for participant on nutrition & aerobic/resistive exercise along with prescribed medications to achieve LDL 70mg , HDL >40mg .    Expected Outcomes Short Term: Participant states understanding of desired cholesterol values and is compliant with medications prescribed. Participant is following exercise prescription and nutrition guidelines.;Long Term: Cholesterol controlled with medications as prescribed, with individualized exercise RX and with personalized nutrition plan. Value goals: LDL < 70mg , HDL > 40 mg.             Education:Diabetes - Individual verbal and written instruction to review signs/symptoms of diabetes, desired ranges of glucose level fasting, after meals and with exercise. Acknowledge  that pre and post exercise glucose checks will be done for 3 sessions at entry of program.   Core Components/Risk Factors/Patient Goals Review:    Core Components/Risk Factors/Patient Goals at Discharge (Final Review):    ITP Comments:  ITP Comments     Row Name 06/06/23 1346 06/10/23 1146         ITP Comments Initial phone call completed. Diagnosis can be found in [facility] 10/30. EP Orientation scheduled for Monday 11/25 at 10:30am. Completed and gym orientation. Initial ITP created and sent for review to Dr. Bethann Punches, Medical Director.               Comments: Initial ITP

## 2023-06-17 ENCOUNTER — Encounter: Payer: Medicare Other | Attending: Internal Medicine | Admitting: *Deleted

## 2023-06-17 DIAGNOSIS — Z955 Presence of coronary angioplasty implant and graft: Secondary | ICD-10-CM | POA: Diagnosis not present

## 2023-06-17 DIAGNOSIS — I252 Old myocardial infarction: Secondary | ICD-10-CM | POA: Insufficient documentation

## 2023-06-17 DIAGNOSIS — Z48812 Encounter for surgical aftercare following surgery on the circulatory system: Secondary | ICD-10-CM | POA: Diagnosis not present

## 2023-06-17 DIAGNOSIS — I213 ST elevation (STEMI) myocardial infarction of unspecified site: Secondary | ICD-10-CM

## 2023-06-17 NOTE — Progress Notes (Signed)
Daily Session Note  Patient Details  Name: Derrick Stone MRN: [id number] Date of Birth: 06-16-1951 Referring Provider:   Flowsheet Row Cardiac Rehab from 06/10/2023 in [facility] Cardiac and Pulmonary Rehab  Referring Provider Dr. Dorothyann Peng       Encounter Date: 06/17/2023  Check In:  Session Check In - 06/17/23 0932       Check-In   Supervising physician immediately available to respond to emergencies See telemetry face sheet for immediately available ER MD    Location [facility]-Cardiac & Pulmonary Rehab    Staff Present Balinda Quails, RDN, LDN;Laureen Manson Passey, BS, RRT, CPFT;Kelly Madilyn Fireman, BS, ACSM CEP, Exercise Physiologist;Amika Tassin Katrinka Blazing, RN, ADN    Virtual Visit No    Medication changes reported     No    Fall or balance concerns reported    No    Warm-up and Cool-down Performed on first and last piece of equipment    Resistance Training Performed Yes    VAD Patient? No    PAD/SET Patient? No      Pain Assessment   Currently in Pain? No/denies                Social History   Tobacco Use  Smoking Status Never  Smokeless Tobacco Never    Goals Met:  Independence with exercise equipment Exercise tolerated well No report of concerns or symptoms today Strength training completed today  Goals Unmet:  Not Applicable  Comments: First full day of exercise!  Patient was oriented to gym and equipment including functions, settings, policies, and procedures.  Patient's individual exercise prescription and treatment plan were reviewed.  All starting workloads were established based on the results of the 6 minute walk test done at initial orientation visit.  The plan for exercise progression was also introduced and progression will be customized based on patient's performance and goals.    Dr. Bethann Punches is Medical Director for [facility] Cardiac Rehabilitation.  Dr. Vida Rigger is Medical Director for [facility] Pulmonary Rehabilitation.

## 2023-06-19 ENCOUNTER — Encounter: Payer: Medicare Other | Admitting: *Deleted

## 2023-06-19 DIAGNOSIS — Z48812 Encounter for surgical aftercare following surgery on the circulatory system: Secondary | ICD-10-CM | POA: Diagnosis not present

## 2023-06-19 DIAGNOSIS — I252 Old myocardial infarction: Secondary | ICD-10-CM | POA: Diagnosis not present

## 2023-06-19 DIAGNOSIS — I213 ST elevation (STEMI) myocardial infarction of unspecified site: Secondary | ICD-10-CM

## 2023-06-19 DIAGNOSIS — Z955 Presence of coronary angioplasty implant and graft: Secondary | ICD-10-CM

## 2023-06-19 NOTE — Progress Notes (Signed)
Daily Session Note  Patient Details  Name: Derrick Stone MRN: [id number] Date of Birth: October 26, 1950 Referring Provider:   Flowsheet Row Cardiac Rehab from 06/10/2023 in [facility] Cardiac and Pulmonary Rehab  Referring Provider Dr. Dorothyann Peng       Encounter Date: 06/19/2023  Check In:  Session Check In - 06/19/23 0755       Check-In   Supervising physician immediately available to respond to emergencies See telemetry face sheet for immediately available ER MD    Location [facility]-Cardiac & Pulmonary Rehab    Staff Present [name], MS, Exercise Physiologist;Joseph Reino Kent, RCP,RRT,BSRT;Maxon [place] BS, , Exercise Physiologist;Camisha Srey Katrinka Blazing, RN, ADN    Virtual Visit No    Medication changes reported     No    Fall or balance concerns reported    No    Warm-up and Cool-down Performed on first and last piece of equipment    Resistance Training Performed Yes    VAD Patient? No    PAD/SET Patient? No      Pain Assessment   Currently in Pain? No/denies                Social History   Tobacco Use  Smoking Status Never  Smokeless Tobacco Never    Goals Met:  Independence with exercise equipment Exercise tolerated well No report of concerns or symptoms today Strength training completed today  Goals Unmet:  Not Applicable  Comments: Pt able to follow exercise prescription today without complaint.  Will continue to monitor for progression.    Dr. Bethann Punches is Medical Director for [facility] Cardiac Rehabilitation.  Dr. Vida Rigger is Medical Director for [facility] Pulmonary Rehabilitation.

## 2023-06-21 ENCOUNTER — Encounter: Payer: Medicare Other | Admitting: *Deleted

## 2023-06-21 DIAGNOSIS — Z955 Presence of coronary angioplasty implant and graft: Secondary | ICD-10-CM | POA: Diagnosis not present

## 2023-06-21 DIAGNOSIS — I252 Old myocardial infarction: Secondary | ICD-10-CM | POA: Diagnosis not present

## 2023-06-21 DIAGNOSIS — I213 ST elevation (STEMI) myocardial infarction of unspecified site: Secondary | ICD-10-CM

## 2023-06-21 DIAGNOSIS — Z48812 Encounter for surgical aftercare following surgery on the circulatory system: Secondary | ICD-10-CM | POA: Diagnosis not present

## 2023-06-21 NOTE — Progress Notes (Signed)
Daily Session Note  Patient Details  Name: Derrick Stone MRN: [id number] Date of Birth: May 29, 1951 Referring Provider:   Flowsheet Row Cardiac Rehab from 06/10/2023 in [facility] Cardiac and Pulmonary Rehab  Referring Provider Dr. Dorothyann Peng       Encounter Date: 06/21/2023  Check In:  Session Check In - 06/21/23 0933       Check-In   Supervising physician immediately available to respond to emergencies See telemetry face sheet for immediately available ER MD    Location [facility]-Cardiac & Pulmonary Rehab    Staff Present Cora Collum, RN, BSN, CCRP;Joseph Hood, RCP,RRT,BSRT;Noah Tickle, [place], Exercise Physiologist    Virtual Visit No    Medication changes reported     No    Fall or balance concerns reported    No    Warm-up and Cool-down Performed on first and last piece of equipment    Resistance Training Performed Yes    VAD Patient? No    PAD/SET Patient? No      Pain Assessment   Currently in Pain? No/denies                Social History   Tobacco Use  Smoking Status Never  Smokeless Tobacco Never    Goals Met:  Independence with exercise equipment Exercise tolerated well No report of concerns or symptoms today  Goals Unmet:  Not Applicable  Comments: Pt able to follow exercise prescription today without complaint.  Will continue to monitor for progression.    Dr. Bethann Punches is Medical Director for [facility] Cardiac Rehabilitation.  Dr. Vida Rigger is Medical Director for [facility] Pulmonary Rehabilitation.

## 2023-06-24 ENCOUNTER — Encounter: Payer: Medicare Other | Admitting: *Deleted

## 2023-06-24 DIAGNOSIS — Z955 Presence of coronary angioplasty implant and graft: Secondary | ICD-10-CM

## 2023-06-24 DIAGNOSIS — I213 ST elevation (STEMI) myocardial infarction of unspecified site: Secondary | ICD-10-CM

## 2023-06-24 DIAGNOSIS — Z48812 Encounter for surgical aftercare following surgery on the circulatory system: Secondary | ICD-10-CM | POA: Diagnosis not present

## 2023-06-24 DIAGNOSIS — I252 Old myocardial infarction: Secondary | ICD-10-CM | POA: Diagnosis not present

## 2023-06-24 NOTE — Progress Notes (Signed)
Daily Session Note  Patient Details  Name: Derrick Stone MRN: [id number] Date of Birth: 09-11-1950 Referring Provider:   Flowsheet Row Cardiac Rehab from 06/10/2023 in [facility] Cardiac and Pulmonary Rehab  Referring Provider Dr. Dorothyann Peng       Encounter Date: 06/24/2023  Check In:  Session Check In - 06/24/23 0756       Check-In   Supervising physician immediately available to respond to emergencies See telemetry face sheet for immediately available ER MD    Location [facility]-Cardiac & Pulmonary Rehab    Staff Present Cora Collum, RN, BSN, CCRP;Jason Wallace Cullens, RDN, LDN;Joseph Reino Kent, Guinevere Ferrari, RN, [place]    Virtual Visit No    Medication changes reported     No    Fall or balance concerns reported    No    Warm-up and Cool-down Performed on first and last piece of equipment    Resistance Training Performed Yes    VAD Patient? No    PAD/SET Patient? No      Pain Assessment   Currently in Pain? No/denies                Social History   Tobacco Use  Smoking Status Never  Smokeless Tobacco Never    Goals Met:  Independence with exercise equipment Exercise tolerated well No report of concerns or symptoms today Strength training completed today  Goals Unmet:  Not Applicable  Comments: Pt able to follow exercise prescription today without complaint.  Will continue to monitor for progression.    Dr. Bethann Punches is Medical Director for [facility] Cardiac Rehabilitation.  Dr. Vida Rigger is Medical Director for [facility] Pulmonary Rehabilitation.

## 2023-06-26 ENCOUNTER — Encounter: Payer: Medicare Other | Admitting: *Deleted

## 2023-06-26 DIAGNOSIS — Z48812 Encounter for surgical aftercare following surgery on the circulatory system: Secondary | ICD-10-CM | POA: Diagnosis not present

## 2023-06-26 DIAGNOSIS — I213 ST elevation (STEMI) myocardial infarction of unspecified site: Secondary | ICD-10-CM

## 2023-06-26 DIAGNOSIS — Z955 Presence of coronary angioplasty implant and graft: Secondary | ICD-10-CM | POA: Diagnosis not present

## 2023-06-26 DIAGNOSIS — I252 Old myocardial infarction: Secondary | ICD-10-CM | POA: Diagnosis not present

## 2023-06-26 NOTE — Progress Notes (Signed)
Daily Session Note  Patient Details  Name: Derrick Stone MRN: [id number] Date of Birth: 09-Jan-1951 Referring Provider:   Flowsheet Row Cardiac Rehab from 06/10/2023 in [facility] Cardiac and Pulmonary Rehab  Referring Provider Dr. Dorothyann Peng       Encounter Date: 06/26/2023  Check In:  Session Check In - 06/26/23 0743       Check-In   Supervising physician immediately available to respond to emergencies See telemetry face sheet for immediately available ER MD    Location [facility]-Cardiac & Pulmonary Rehab    Staff Present Cora Collum, RN, BSN, CCRP;Noah Tickle, BS, Exercise Physiologist;Maxon Conetta BS, , Exercise Physiologist;Oliviagrace Crisanti Katrinka Blazing, RN, ADN    Virtual Visit No    Medication changes reported     No    Fall or balance concerns reported    No    Warm-up and Cool-down Performed on first and last piece of equipment    Resistance Training Performed Yes    VAD Patient? No    PAD/SET Patient? No      Pain Assessment   Currently in Pain? No/denies                Social History   Tobacco Use  Smoking Status Never  Smokeless Tobacco Never    Goals Met:  Independence with exercise equipment Exercise tolerated well No report of concerns or symptoms today Strength training completed today  Goals Unmet:  Not Applicable  Comments: Pt able to follow exercise prescription today without complaint.  Will continue to monitor for progression.    Dr. Bethann Punches is Medical Director for [facility] Cardiac Rehabilitation.  Dr. Vida Rigger is Medical Director for [facility] Pulmonary Rehabilitation.

## 2023-06-28 ENCOUNTER — Encounter: Payer: Medicare Other | Admitting: *Deleted

## 2023-06-28 DIAGNOSIS — Z955 Presence of coronary angioplasty implant and graft: Secondary | ICD-10-CM | POA: Diagnosis not present

## 2023-06-28 DIAGNOSIS — Z48812 Encounter for surgical aftercare following surgery on the circulatory system: Secondary | ICD-10-CM | POA: Diagnosis not present

## 2023-06-28 DIAGNOSIS — I252 Old myocardial infarction: Secondary | ICD-10-CM | POA: Diagnosis not present

## 2023-06-28 DIAGNOSIS — I213 ST elevation (STEMI) myocardial infarction of unspecified site: Secondary | ICD-10-CM

## 2023-06-28 NOTE — Progress Notes (Signed)
Daily Session Note  Patient Details  Name: NEKHI OEN MRN: [id number] Date of Birth: 1951-07-03 Referring Provider:   Flowsheet Row Cardiac Rehab from 06/10/2023 in [facility] Cardiac and Pulmonary Rehab  Referring Provider Dr. Dorothyann Peng       Encounter Date: 06/28/2023  Check In:  Session Check In - 06/28/23 0836       Check-In   Supervising physician immediately available to respond to emergencies See telemetry face sheet for immediately available ER MD    Location [facility]-Cardiac & Pulmonary Rehab    Staff Present Ronette Deter, BS, Exercise Physiologist;Joseph Shelbie Proctor, RN, [place]    Virtual Visit No    Medication changes reported     No    Fall or balance concerns reported    No    Warm-up and Cool-down Performed on first and last piece of equipment    Resistance Training Performed Yes    VAD Patient? No    PAD/SET Patient? No      Pain Assessment   Currently in Pain? No/denies                Social History   Tobacco Use  Smoking Status Never  Smokeless Tobacco Never    Goals Met:  Independence with exercise equipment Exercise tolerated well No report of concerns or symptoms today Strength training completed today  Goals Unmet:  Not Applicable  Comments: Pt able to follow exercise prescription today without complaint.  Will continue to monitor for progression.    Dr. Bethann Punches is Medical Director for [facility] Cardiac Rehabilitation.  Dr. Vida Rigger is Medical Director for [facility] Pulmonary Rehabilitation.

## 2023-07-01 ENCOUNTER — Encounter: Payer: Medicare Other | Admitting: *Deleted

## 2023-07-01 DIAGNOSIS — Z48812 Encounter for surgical aftercare following surgery on the circulatory system: Secondary | ICD-10-CM | POA: Diagnosis not present

## 2023-07-01 DIAGNOSIS — I252 Old myocardial infarction: Secondary | ICD-10-CM | POA: Diagnosis not present

## 2023-07-01 DIAGNOSIS — Z955 Presence of coronary angioplasty implant and graft: Secondary | ICD-10-CM | POA: Diagnosis not present

## 2023-07-01 DIAGNOSIS — I213 ST elevation (STEMI) myocardial infarction of unspecified site: Secondary | ICD-10-CM

## 2023-07-01 NOTE — Progress Notes (Signed)
Daily Session Note  Patient Details  Name: Derrick Stone MRN: [id number] Date of Birth: 1951-05-25 Referring Provider:   Flowsheet Row Cardiac Rehab from 06/10/2023 in [facility] Cardiac and Pulmonary Rehab  Referring Provider Dr. Dorothyann Peng       Encounter Date: 07/01/2023  Check In:  Session Check In - 07/01/23 0757       Check-In   Supervising physician immediately available to respond to emergencies See telemetry face sheet for immediately available ER MD    Location [facility]-Cardiac & Pulmonary Rehab    Staff Present Balinda Quails, RDN, LDN;Joseph Reino Kent, RCP,RRT,BSRT;Kelly Madilyn Fireman, BS, ACSM CEP, Exercise Physiologist;Tajai Suder Katrinka Blazing, RN, ADN    Virtual Visit No    Medication changes reported     No    Fall or balance concerns reported    No    Warm-up and Cool-down Performed on first and last piece of equipment    Resistance Training Performed No    VAD Patient? No    PAD/SET Patient? No      Pain Assessment   Currently in Pain? No/denies                Social History   Tobacco Use  Smoking Status Never  Smokeless Tobacco Never    Goals Met:  Independence with exercise equipment Exercise tolerated well No report of concerns or symptoms today Strength training completed today  Goals Unmet:  Not Applicable  Comments: Pt able to follow exercise prescription today without complaint.  Will continue to monitor for progression.    Dr. Bethann Punches is Medical Director for [facility] Cardiac Rehabilitation.  Dr. Vida Rigger is Medical Director for [facility] Pulmonary Rehabilitation.

## 2023-07-03 ENCOUNTER — Encounter: Payer: Self-pay | Admitting: *Deleted

## 2023-07-03 ENCOUNTER — Encounter: Payer: Medicare Other | Admitting: *Deleted

## 2023-07-03 DIAGNOSIS — Z955 Presence of coronary angioplasty implant and graft: Secondary | ICD-10-CM | POA: Diagnosis not present

## 2023-07-03 DIAGNOSIS — I213 ST elevation (STEMI) myocardial infarction of unspecified site: Secondary | ICD-10-CM

## 2023-07-03 DIAGNOSIS — I252 Old myocardial infarction: Secondary | ICD-10-CM | POA: Diagnosis not present

## 2023-07-03 DIAGNOSIS — Z48812 Encounter for surgical aftercare following surgery on the circulatory system: Secondary | ICD-10-CM | POA: Diagnosis not present

## 2023-07-03 NOTE — Progress Notes (Signed)
Cardiac Individual Treatment Plan  Patient Details  Name: Derrick Stone MRN: [id number] Date of Birth: 1951/01/29 Referring Provider:   Flowsheet Row Cardiac Rehab from 06/10/2023 in [facility] Cardiac and Pulmonary Rehab  Referring Provider Dr. Dorothyann Peng       Initial Encounter Date:  Flowsheet Row Cardiac Rehab from 06/10/2023 in [facility] Cardiac and Pulmonary Rehab  Date 06/10/23       Visit Diagnosis: ST elevation myocardial infarction (STEMI), unspecified artery [facility])  Status post coronary artery stent placement  Patient's Home Medications on Admission:  Current Outpatient Medications:    aspirin EC 81 MG tablet, Take 81 mg by mouth daily., Disp: , Rfl:    atorvastatin (LIPITOR) 80 MG tablet, Take 1 tablet (80 mg total) by mouth daily., Disp: 90 tablet, Rfl: 1   clopidogrel (PLAVIX) 75 MG tablet, Take 1 tablet (75 mg total) by mouth daily., Disp: 30 tablet, Rfl: 0   escitalopram (LEXAPRO) 10 MG tablet, TAKE 1 TABLET (10 MG TOTAL) BY MOUTH ONCE DAILY., Disp: , Rfl: 1   ezetimibe (ZETIA) 10 MG tablet, Take 1 tablet (10 mg total) by mouth daily., Disp: 90 tablet, Rfl: 3   losartan (COZAAR) 25 MG tablet, Take 1 tablet (25 mg total) by mouth daily. (Patient taking differently: Take 12.5 mg by mouth daily.), Disp: 30 tablet, Rfl: 1   metoprolol succinate (TOPROL-XL) 25 MG 24 hr tablet, Take 1 tablet by mouth daily., Disp: , Rfl:    mupirocin ointment (BACTROBAN) 2 %, Apply daily with bandage change, Disp: 22 g, Rfl: 1   Omega-3 Fatty Acids (FISH OIL PO), Take 1 capsule by mouth daily., Disp: , Rfl:    omeprazole (PRILOSEC OTC) 20 MG tablet, Take 1 tablet by mouth daily., Disp: , Rfl:   Past Medical History: Past Medical History:  Diagnosis Date   CAD (coronary artery disease)    History of chickenpox    History of mumps    Hyperlipidemia    Hypertension    NSTEMI (non-ST elevated myocardial infarction) (HCC) 09/19/2020    Tobacco Use: Social History   Tobacco Use  Smoking  Status Never  Smokeless Tobacco Never    Labs: Review Flowsheet  More data exists      Latest Ref Rng & Units 09/20/2020 09/21/2020 10/25/2020 12/06/2021 01/30/2023  Labs for ITP Cardiac and Pulmonary Rehab  Cholestrol 100 - 199 mg/dL 409  - 811  914  782   LDL (calc) 0 - 99 mg/dL 90  - 72  92  87   HDL-C >39 mg/dL 36  - 33  40  45   Trlycerides 0 - 149 mg/dL 92  - 956  213  086   Hemoglobin A1c 4.8 - 5.6 % - 6.1  - 6.1  6.0      Exercise Target Goals: Exercise Program Goal: Individual exercise prescription set using results from initial 6 min walk test and THRR while considering  patient's activity barriers and safety.   Exercise Prescription Goal: Initial exercise prescription builds to 30-45 minutes a day of aerobic activity, 2-3 days per week.  Home exercise guidelines will be given to patient during program as part of exercise prescription that the participant will acknowledge.   Education: Aerobic Exercise: - Group verbal and visual presentation on the components of exercise prescription. Introduces F.I.T.T principle from ACSM for exercise prescriptions.  Reviews F.I.T.T. principles of aerobic exercise including progression. Written material given at graduation. Flowsheet Row Cardiac Rehab from 07/03/2023 in [facility] Cardiac and Pulmonary  Rehab  Education need identified 06/17/23       Education: Resistance Exercise: - Group verbal and visual presentation on the components of exercise prescription. Introduces F.I.T.T principle from ACSM for exercise prescriptions  Reviews F.I.T.T. principles of resistance exercise including progression. Written material given at graduation.    Education: Exercise & Equipment Safety: - Individual verbal instruction and demonstration of equipment use and safety with use of the equipment. Flowsheet Row Cardiac Rehab from 07/03/2023 in [facility] Cardiac and Pulmonary Rehab  Date 06/17/23  Educator [facility]  Instruction Review Code 1- Verbalizes Understanding        Education: Exercise Physiology & General Exercise Guidelines: - Group verbal and written instruction with models to review the exercise physiology of the cardiovascular system and associated critical values. Provides general exercise guidelines with specific guidelines to those with heart or lung disease.  Flowsheet Row Cardiac Rehab from 07/03/2023 in [facility] Cardiac and Pulmonary Rehab  Date 06/17/23  Educator [facility]  Instruction Review Code 1- [employer] Understanding       Education: Flexibility, Balance, Mind/Body Relaxation: - Group verbal and visual presentation with interactive activity on the components of exercise prescription. Introduces F.I.T.T principle from ACSM for exercise prescriptions. Reviews F.I.T.T. principles of flexibility and balance exercise training including progression. Also discusses the mind body connection.  Reviews various relaxation techniques to help reduce and manage stress (i.e. Deep breathing, progressive muscle relaxation, and visualization). Balance handout provided to take home. Written material given at graduation.   Activity Barriers & Risk Stratification:  Activity Barriers & Cardiac Risk Stratification - 06/10/23 1149       Activity Barriers & Cardiac Risk Stratification   Activity Barriers Other (comment)    Comments 10 years ago- fell off ladder- sometimes left foot gets aggravated    Cardiac Risk Stratification High             6 Minute Walk:  6 Minute Walk     Row Name 06/10/23 1146         6 Minute Walk   Phase Initial     Distance 1470 feet     Walk Time 6 minutes     # of Rest Breaks 0     MPH 2.8     METS 3.11     RPE 7     Perceived Dyspnea  0     VO2 Peak 10.89     Symptoms No     Resting HR 84 bpm     Resting BP 116/66     Resting Oxygen Saturation  98 %     Exercise Oxygen Saturation  during 6 min walk 98 %     Max Ex. HR 98 bpm     Max Ex. BP 120/62     2 Minute Post BP 116/64              Oxygen  Initial Assessment:   Oxygen Re-Evaluation:   Oxygen Discharge (Final Oxygen Re-Evaluation):   Initial Exercise Prescription:  Initial Exercise Prescription - 06/10/23 1100       Date of Initial Exercise RX and Referring Provider   Date 06/10/23    Referring Provider Dr. Dorothyann Peng      Oxygen   Maintain Oxygen Saturation 88% or higher      Treadmill   MPH 2.8    Grade 1    Minutes 15    METs 3.53      Elliptical   Level 1    Speed  1    Minutes 15    METs 3.11      REL-XR   Level 3    Speed 50    Minutes 15    METs 3.11      Prescription Details   Duration Progress to 30 minutes of continuous aerobic without signs/symptoms of physical distress      Intensity   THRR 40-80% of Max Heartrate 110-136    Ratings of Perceived Exertion 11-13    Perceived Dyspnea 0-4      Progression   Progression Continue to progress workloads to maintain intensity without signs/symptoms of physical distress.      Resistance Training   Training Prescription Yes    Weight 5 lb    Reps 10-15             Perform Capillary Blood Glucose checks as needed.  Exercise Prescription Changes:   Exercise Prescription Changes     Row Name 06/10/23 1100 06/25/23 1500           Response to Exercise   Blood Pressure (Admit) 116/66 118/56      Blood Pressure (Exercise) 120/62 132/58      Blood Pressure (Exit) 116/64 102/56      Heart Rate (Admit) 84 bpm 75 bpm      Heart Rate (Exercise) 98 bpm 130 bpm      Heart Rate (Exit) 86 bpm 89 bpm      Oxygen Saturation (Admit) 98 % --      Oxygen Saturation (Exercise) 98 % --      Oxygen Saturation (Exit) 98 % --      Rating of Perceived Exertion (Exercise) 7 15      Perceived Dyspnea (Exercise) 0 0      Symptoms none none      Comments results --      Duration -- Progress to 30 minutes of  aerobic without signs/symptoms of physical distress      Intensity -- THRR unchanged        Progression   Progression -- Continue  to progress workloads to maintain intensity without signs/symptoms of physical distress.      Average METs 3.11 3.8        Resistance Training   Training Prescription -- Yes      Weight -- 5 lb      Reps -- 10-15        Interval Training   Interval Training -- No        Treadmill   MPH -- 3.2      Grade -- 3      Minutes -- 15      METs -- 4.77        Elliptical   Level -- 2      Speed -- 1      Minutes -- 15      METs -- 3.5        Oxygen   Maintain Oxygen Saturation -- 88% or higher               Exercise Comments:   Exercise Comments     Row Name 06/17/23 0932           Exercise Comments First full day of exercise!  Patient was oriented to gym and equipment including functions, settings, policies, and procedures.  Patient's individual exercise prescription and treatment plan were reviewed.  All starting workloads were established based on the results of the 6 minute walk  test done at initial orientation visit.  The plan for exercise progression was also introduced and progression will be customized based on patient's performance and goals.                Exercise Goals and Review:   Exercise Goals     Row Name 06/10/23 1154             Exercise Goals   Increase Physical Activity Yes       Expected Outcomes Long Term: Exercising regularly at least 3-5 days a week.;Long Term: Add in home exercise to make exercise part of routine and to increase amount of physical activity.;Short Term: Attend rehab on a regular basis to increase amount of physical activity.       Increase Strength and Stamina Yes       Intervention Provide advice, education, support and counseling about physical activity/exercise needs.;Develop an individualized exercise prescription for aerobic and resistive training based on initial evaluation findings, risk stratification, comorbidities and participant's personal goals.       Expected Outcomes Long Term: Improve cardiorespiratory  fitness, muscular endurance and strength as measured by increased METs and functional capacity ( );Short Term: Perform resistance training exercises routinely during rehab and add in resistance training at home;Short Term: Increase workloads from initial exercise prescription for resistance, speed, and METs.       Able to understand and use rate of perceived exertion (RPE) scale Yes       Intervention Provide education and explanation on how to use RPE scale       Expected Outcomes Long Term:  Able to use RPE to guide intensity level when exercising independently;Short Term: Able to use RPE daily in rehab to express subjective intensity level       Able to understand and use Dyspnea scale Yes       Intervention Provide education and explanation on how to use Dyspnea scale       Expected Outcomes Long Term: Able to use Dyspnea scale to guide intensity level when exercising independently;Short Term: Able to use Dyspnea scale daily in rehab to express subjective sense of shortness of breath during exertion       Knowledge and understanding of Target Heart Rate Range (THRR) Yes       Intervention Provide education and explanation of THRR including how the numbers were predicted and where they are located for reference       Expected Outcomes Long Term: Able to use THRR to govern intensity when exercising independently;Short Term: Able to use daily as guideline for intensity in rehab;Short Term: Able to state/look up THRR       Able to check pulse independently Yes       Intervention Review the importance of being able to check your own pulse for safety during independent exercise;Provide education and demonstration on how to check pulse in carotid and radial arteries.       Expected Outcomes Long Term: Able to check pulse independently and accurately;Short Term: Able to explain why pulse checking is important during independent exercise       Understanding of Exercise Prescription Yes       Intervention  Provide education, explanation, and written materials on patient's individual exercise prescription       Expected Outcomes Long Term: Able to explain home exercise prescription to exercise independently;Short Term: Able to explain program exercise prescription                Exercise Goals  Re-Evaluation :  Exercise Goals Re-Evaluation     Row Name 06/17/23 0933 06/25/23 1531           Exercise Goal Re-Evaluation   Exercise Goals Review Able to understand and use rate of perceived exertion (RPE) scale;Able to understand and use Dyspnea scale;Knowledge and understanding of Target Heart Rate Range (THRR);Understanding of Exercise Prescription Increase Physical Activity;Increase Strength and Stamina;Understanding of Exercise Prescription      Comments Reviewed RPE and dyspnea scale, THR and program prescription with pt today.  Pt voiced understanding and was given a copy of goals to take home. Gurshawn is off to a good start in the program. He has already made increases to hisinitial  exercise prescription. He was able to increase his level on the elliptical from level 1 to 2. He was also able to increase his speed on the treadmill from 2.8 to 3.2 mph. We will continue to monitor his progress in the program.      Expected Outcomes Short: Use RPE daily to regulate intensity. Long: Follow program prescription in THR. Short: Continue to follow current exercise prescription. Long: Continue exercise to improve strength and stamina.               Discharge Exercise Prescription (Final Exercise Prescription Changes):  Exercise Prescription Changes - 06/25/23 1500       Response to Exercise   Blood Pressure (Admit) 118/56    Blood Pressure (Exercise) 132/58    Blood Pressure (Exit) 102/56    Heart Rate (Admit) 75 bpm    Heart Rate (Exercise) 130 bpm    Heart Rate (Exit) 89 bpm    Rating of Perceived Exertion (Exercise) 15    Perceived Dyspnea (Exercise) 0    Symptoms none    Duration  Progress to 30 minutes of  aerobic without signs/symptoms of physical distress    Intensity THRR unchanged      Progression   Progression Continue to progress workloads to maintain intensity without signs/symptoms of physical distress.    Average METs 3.8      Resistance Training   Training Prescription Yes    Weight 5 lb    Reps 10-15      Interval Training   Interval Training No      Treadmill   MPH 3.2    Grade 3    Minutes 15    METs 4.77      Elliptical   Level 2    Speed 1    Minutes 15    METs 3.5      Oxygen   Maintain Oxygen Saturation 88% or higher             Nutrition:  Target Goals: Understanding of nutrition guidelines, daily intake of sodium 1500mg , cholesterol 200mg , calories 30% from fat and 7% or less from saturated fats, daily to have 5 or more servings of fruits and vegetables.  Education: All About Nutrition: -Group instruction provided by verbal, written material, interactive activities, discussions, models, and posters to present general guidelines for heart healthy nutrition including fat, fiber, MyPlate, the role of sodium in heart healthy nutrition, utilization of the nutrition label, and utilization of this knowledge for meal planning. Follow up email sent as well. Written material given at graduation. Flowsheet Row Cardiac Rehab from 07/03/2023 in [facility] Cardiac and Pulmonary Rehab  Education need identified 06/17/23       Biometrics:  Pre Biometrics - 06/10/23 1154       Pre Biometrics  Height 6' 1.4" (1.864 m)    Weight 219 lb 6.4 oz (99.5 kg)    Waist Circumference 42 inches    Hip Circumference 41 inches    Waist to Hip Ratio 1.02 %    BMI (Calculated) 28.64    Single Leg Stand 30 seconds              Nutrition Therapy Plan and Nutrition Goals:  Nutrition Therapy & Goals - 06/10/23 1329       Nutrition Therapy   Diet carb controlled, cardiac, low Na    Protein (specify units) 90    Fiber 30 grams    Whole  Grain Foods 3 servings    Saturated Fats 15 max. grams    Fruits and Vegetables 5 servings/day    Sodium 2 grams      Personal Nutrition Goals   Nutrition Goal Eat 15-30gProtein and 30-60gCarbs at each meal.    Personal Goal #2 Drink 64oz of water, cut out sugary beverages    Personal Goal #3 Reduce saturated fat, less than 12g per day. Replace bad fats for more heart healthy fats.    Comments Patient drinking adequate water. ~64oz per day. He was drinking sprite and orange juice as well. After speaking with him he will quit drinking sugary beverages in favor of plain water. Educated him on types of carbs and quantity control with recommended portions of ~30-60g each meal. He reports sometimes he is not very hungry. Explained the importance of protein carb pairing. Brainstormed some smaller easy to make meals and snacks for him to try. Reviewed Mediterranean diet handout, educated on types of fats, sources, and how to read facts label. Built out several meals and snacks with foods he likes and will eat. Went over several of his preferred foods and recommended changes he could try to implement to improve his heart health.      Intervention Plan   Intervention Prescribe, educate and counsel regarding individualized specific dietary modifications aiming towards targeted core components such as weight, hypertension, lipid management, diabetes, heart failure and other comorbidities.;Nutrition handout(s) given to patient.    Expected Outcomes Short Term Goal: Understand basic principles of dietary content, such as calories, fat, sodium, cholesterol and nutrients.;Short Term Goal: A plan has been developed with personal nutrition goals set during dietitian appointment.;Long Term Goal: Adherence to prescribed nutrition plan.             Nutrition Assessments:  MEDIFICTS Score Key: >=70 Need to make dietary changes  40-70 Heart Healthy Diet <= 40 Therapeutic Level Cholesterol Diet  Flowsheet Row  Cardiac Rehab from 06/28/2023 in [facility] Cardiac and Pulmonary Rehab  Picture Your Plate Total Score on Admission 65      Picture Your Plate Scores: <16 Unhealthy dietary pattern with much room for improvement. 41-50 Dietary pattern unlikely to meet recommendations for good health and room for improvement. 51-60 More healthful dietary pattern, with some room for improvement.  >60 Healthy dietary pattern, although there may be some specific behaviors that could be improved.    Nutrition Goals Re-Evaluation:   Nutrition Goals Discharge (Final Nutrition Goals Re-Evaluation):   Psychosocial: Target Goals: Acknowledge presence or absence of significant depression and/or stress, maximize coping skills, provide positive support system. Participant is able to verbalize types and ability to use techniques and skills needed for reducing stress and depression.   Education: Stress, Anxiety, and Depression - Group verbal and visual presentation to define topics covered.  Reviews how body is impacted  by stress, anxiety, and depression.  Also discusses healthy ways to reduce stress and to treat/manage anxiety and depression.  Written material given at graduation. Flowsheet Row Cardiac Rehab from 07/03/2023 in [facility] Cardiac and Pulmonary Rehab  Date 07/03/23  Educator SB  Instruction Review Code 1- [employer] Understanding       Education: Sleep Hygiene -Provides group verbal and written instruction about how sleep can affect your health.  Define sleep hygiene, discuss sleep cycles and impact of sleep habits. Review good sleep hygiene tips.    Initial Review & Psychosocial Screening:  Initial Psych Review & Screening - 06/06/23 1340       Initial Review   Current issues with Current Stress Concerns    Source of Stress Concerns Chronic Illness    Comments 3rd MI      Family Dynamics   Good Support System? Yes   wife, family     Barriers   Psychosocial barriers to participate in program  There are no identifiable barriers or psychosocial needs.      Screening Interventions   Interventions Encouraged to exercise;Provide feedback about the scores to participant;To provide support and resources with identified psychosocial needs    Expected Outcomes Short Term goal: Utilizing psychosocial counselor, staff and physician to assist with identification of specific Stressors or current issues interfering with healing process. Setting desired goal for each stressor or current issue identified.;Long Term Goal: Stressors or current issues are controlled or eliminated.;Short Term goal: Identification and review with participant of any Quality of Life or Depression concerns found by scoring the questionnaire.;Long Term goal: The participant improves quality of Life and PHQ9 Scores as seen by post scores and/or verbalization of changes             Quality of Life Scores:   Quality of Life - 06/17/23 0852       Quality of Life   Select Quality of Life      Quality of Life Scores   Health/Function Pre 28.29 %    Socioeconomic Pre 30 %    Psych/Spiritual Pre 30 %    Family Pre 30 %    GLOBAL Pre 29.29 %            Scores of 19 and below usually indicate a poorer quality of life in these areas.  A difference of  2-3 points is a clinically meaningful difference.  A difference of 2-3 points in the total score of the Quality of Life Index has been associated with significant improvement in overall quality of life, self-image, physical symptoms, and general health in studies assessing change in quality of life.  PHQ-9: Review Flowsheet  More data exists      06/10/2023 01/30/2023 12/25/2022 12/06/2021 10/24/2020  Depression screen PHQ 2/9  Decreased Interest 0 0 0 0 0  Down, Depressed, Hopeless 0 0 0 0 0  PHQ - 2 Score 0 0 0 0 0  Altered sleeping 0 - - 0 0  Tired, decreased energy 1 - - 0 0  Change in appetite 0 - - 0 0  Feeling bad or failure about yourself  0 - - 0 0  Trouble  concentrating 0 - - 0 0  Moving slowly or fidgety/restless 0 - - 0 0  Suicidal thoughts 0 - - 0 0  PHQ-9 Score 1 - - 0 0  Difficult doing work/chores Not difficult at all - - Not difficult at all Not difficult at all   Interpretation of Total Score  Total Score Depression Severity:  1-4 = Minimal depression, 5-9 = Mild depression, 10-14 = Moderate depression, 15-19 = Moderately severe depression, 20-27 = Severe depression   Psychosocial Evaluation and Intervention:  Psychosocial Evaluation - 06/06/23 1349       Psychosocial Evaluation & Interventions   Interventions Encouraged to exercise with the program and follow exercise prescription;Stress management education    Comments Cloys is coming to cardiac rehab after his third MI. He and his wife are concerned about his health and he is very motivated to participate in the program. He enjoys golfing and spending time with his family. His wife and family are his main support system. He does take Tylenol pm at night to help him sleep and states this is still working for him, but he has been a restless sleeper for a while now. He is ready to get started to learn more about heart healthy living.    Expected Outcomes Short: attend cardiac rehab for education and exercise. Long: develop and maintain positive self care habits    Continue Psychosocial Services  Follow up required by staff             Psychosocial Re-Evaluation:   Psychosocial Discharge (Final Psychosocial Re-Evaluation):   Vocational Rehabilitation: Provide vocational rehab assistance to qualifying candidates.   Vocational Rehab Evaluation & Intervention:  Vocational Rehab - 06/06/23 1338       Initial Vocational Rehab Evaluation & Intervention   Assessment shows need for Vocational Rehabilitation No             Education: Education Goals: Education classes will be provided on a variety of topics geared toward better understanding of heart health and risk  factor modification. Participant will state understanding/return demonstration of topics presented as noted by education test scores.  Learning Barriers/Preferences:  Learning Barriers/Preferences - 06/06/23 1338       Learning Barriers/Preferences   Learning Barriers None    Learning Preferences None             General Cardiac Education Topics:  AED/CPR: - Group verbal and written instruction with the use of models to demonstrate the basic use of the AED with the basic ABC's of resuscitation.   Anatomy and Cardiac Procedures: - Group verbal and visual presentation and models provide information about basic cardiac anatomy and function. Reviews the testing methods done to diagnose heart disease and the outcomes of the test results. Describes the treatment choices: Medical Management, Angioplasty, or Coronary Bypass Surgery for treating various heart conditions including Myocardial Infarction, Angina, Valve Disease, and Cardiac Arrhythmias.  Written material given at graduation. Flowsheet Row Cardiac Rehab from 07/03/2023 in [facility] Cardiac and Pulmonary Rehab  Education need identified 06/17/23       Medication Safety: - Group verbal and visual instruction to review commonly prescribed medications for heart and lung disease. Reviews the medication, class of the drug, and side effects. Includes the steps to properly store meds and maintain the prescription regimen.  Written material given at graduation.   Intimacy: - Group verbal instruction through game format to discuss how heart and lung disease can affect sexual intimacy. Written material given at graduation..   Know Your Numbers and Heart Failure: - Group verbal and visual instruction to discuss disease risk factors for cardiac and pulmonary disease and treatment options.  Reviews associated critical values for Overweight/Obesity, Hypertension, Cholesterol, and Diabetes.  Discusses basics of heart failure: signs/symptoms and  treatments.  Introduces Heart Failure Zone chart for action plan for  heart failure.  Written material given at graduation. Flowsheet Row Cardiac Rehab from 07/03/2023 in [facility] Cardiac and Pulmonary Rehab  Education need identified 06/17/23  Date 06/19/23  Educator SB  Instruction Review Code 1- Verbalizes Understanding       Infection Prevention: - Provides verbal and written material to individual with discussion of infection control including proper hand washing and proper equipment cleaning during exercise session. Flowsheet Row Cardiac Rehab from 07/03/2023 in [facility] Cardiac and Pulmonary Rehab  Date 06/17/23  Educator [facility]  Instruction Review Code 1- Verbalizes Understanding       Falls Prevention: - Provides verbal and written material to individual with discussion of falls prevention and safety. Flowsheet Row Cardiac Rehab from 07/03/2023 in [facility] Cardiac and Pulmonary Rehab  Date 06/17/23  Educator [facility]  Instruction Review Code 1- Verbalizes Understanding       Other: -Provides group and verbal instruction on various topics (see comments)   Knowledge Questionnaire Score:  Knowledge Questionnaire Score - 06/17/23 0853       Knowledge Questionnaire Score   Pre Score 21/26             Core Components/Risk Factors/Patient Goals at Admission:  Personal Goals and Risk Factors at Admission - 06/10/23 1155       Core Components/Risk Factors/Patient Goals on Admission    Weight Management Weight Loss    Hypertension Yes    Intervention Provide education on lifestyle modifcations including regular physical activity/exercise, weight management, moderate sodium restriction and increased consumption of fresh fruit, vegetables, and low fat dairy, alcohol moderation, and smoking cessation.;Monitor prescription use compliance.    Expected Outcomes Short Term: Continued assessment and intervention until BP is < 140/38mm HG in hypertensive participants. < 130/34mm HG in  hypertensive participants with diabetes, heart failure or chronic kidney disease.;Long Term: Maintenance of blood pressure at goal levels.    Lipids Yes    Intervention Provide education and support for participant on nutrition & aerobic/resistive exercise along with prescribed medications to achieve LDL 70mg , HDL >40mg .    Expected Outcomes Short Term: Participant states understanding of desired cholesterol values and is compliant with medications prescribed. Participant is following exercise prescription and nutrition guidelines.;Long Term: Cholesterol controlled with medications as prescribed, with individualized exercise RX and with personalized nutrition plan. Value goals: LDL < 70mg , HDL > 40 mg.             Education:Diabetes - Individual verbal and written instruction to review signs/symptoms of diabetes, desired ranges of glucose level fasting, after meals and with exercise. Acknowledge that pre and post exercise glucose checks will be done for 3 sessions at entry of program.   Core Components/Risk Factors/Patient Goals Review:    Core Components/Risk Factors/Patient Goals at Discharge (Final Review):    ITP Comments:  ITP Comments     Row Name 06/06/23 1346 06/10/23 1146 06/17/23 0932 07/03/23 0956     ITP Comments Initial phone call completed. Diagnosis can be found in [facility] 10/30. EP Orientation scheduled for Monday 11/25 at 10:30am. Completed and gym orientation. Initial ITP created and sent for review to Dr. Bethann Punches, Medical Director. First full day of exercise!  Patient was oriented to gym and equipment including functions, settings, policies, and procedures.  Patient's individual exercise prescription and treatment plan were reviewed.  All starting workloads were established based on the results of the 6 minute walk test done at initial orientation visit.  The plan for exercise progression was also introduced and progression will  be customized based on patient's  performance and goals. 30 Day review completed. Medical Director ITP review done, changes made as directed, and signed approval by Medical Director.    new to program             Comments:

## 2023-07-03 NOTE — Progress Notes (Signed)
Daily Session Note  Patient Details  Name: Derrick Stone MRN: [id number] Date of Birth: 11/13/1950 Referring Provider:   Flowsheet Row Cardiac Rehab from 06/10/2023 in [facility] Cardiac and Pulmonary Rehab  Referring Provider Dr. Dorothyann Peng       Encounter Date: 07/03/2023  Check In:  Session Check In - 07/03/23 0740       Check-In   Supervising physician immediately available to respond to emergencies See telemetry face sheet for immediately available ER MD    Location [facility]-Cardiac & Pulmonary Rehab    Staff Present Bess Kinds RN, BSN;Joseph Hood, RCP,RRT,BSRT;Maxon [place] BS, , Exercise Physiologist;Kamarri Fischetti Katrinka Blazing, RN, [place]    Virtual Visit No    Medication changes reported     No    Fall or balance concerns reported    No    Warm-up and Cool-down Performed on first and last piece of equipment    Resistance Training Performed Yes    VAD Patient? No    PAD/SET Patient? No      Pain Assessment   Currently in Pain? No/denies                Social History   Tobacco Use  Smoking Status Never  Smokeless Tobacco Never    Goals Met:  Independence with exercise equipment Exercise tolerated well No report of concerns or symptoms today Strength training completed today  Goals Unmet:  Not Applicable  Comments: Pt able to follow exercise prescription today without complaint.  Will continue to monitor for progression.    Dr. Bethann Punches is Medical Director for [facility] Cardiac Rehabilitation.  Dr. Vida Rigger is Medical Director for [facility] Pulmonary Rehabilitation.

## 2023-07-05 ENCOUNTER — Encounter: Payer: Medicare Other | Admitting: *Deleted

## 2023-07-05 DIAGNOSIS — Z48812 Encounter for surgical aftercare following surgery on the circulatory system: Secondary | ICD-10-CM | POA: Diagnosis not present

## 2023-07-05 DIAGNOSIS — Z955 Presence of coronary angioplasty implant and graft: Secondary | ICD-10-CM

## 2023-07-05 DIAGNOSIS — I213 ST elevation (STEMI) myocardial infarction of unspecified site: Secondary | ICD-10-CM

## 2023-07-05 DIAGNOSIS — I252 Old myocardial infarction: Secondary | ICD-10-CM | POA: Diagnosis not present

## 2023-07-05 NOTE — Progress Notes (Signed)
Daily Session Note  Patient Details  Name: Derrick Stone MRN: [id number] Date of Birth: [birth year] Referring Provider:   Flowsheet Row Cardiac Rehab from 06/10/2023 in [facility] Cardiac and Pulmonary Rehab  Referring Provider Dr. Dorothyann Peng       Encounter Date: 07/05/2023  Check In:  Session Check In - 07/05/23 0756       Check-In   Supervising physician immediately available to respond to emergencies See telemetry face sheet for immediately available ER MD    Location [facility]-Cardiac & Pulmonary Rehab    Staff Present Cora Collum, RN, BSN, CCRP;Noah Tickle, BS, Exercise Physiologist;Joseph [place], [place]    Virtual Visit No    Medication changes reported     No    Fall or balance concerns reported    No    Warm-up and Cool-down Performed on first and last piece of equipment    Resistance Training Performed Yes    VAD Patient? No    PAD/SET Patient? No      Pain Assessment   Currently in Pain? No/denies                Social History   Tobacco Use  Smoking Status Never  Smokeless Tobacco Never    Goals Met:  Independence with exercise equipment Exercise tolerated well No report of concerns or symptoms today  Goals Unmet:  Not Applicable  Comment:     Pt able to follow exercise prescription today without complaint.  Will continue to monitor for progression.    Dr. Bethann Punches is Medical Director for [facility] Cardiac Rehabilitation.  Dr. Vida Rigger is Medical Director for [facility] Pulmonary Rehabilitation.

## 2023-07-08 ENCOUNTER — Encounter: Payer: Medicare Other | Admitting: *Deleted

## 2023-07-08 DIAGNOSIS — Z955 Presence of coronary angioplasty implant and graft: Secondary | ICD-10-CM | POA: Diagnosis not present

## 2023-07-08 DIAGNOSIS — I213 ST elevation (STEMI) myocardial infarction of unspecified site: Secondary | ICD-10-CM

## 2023-07-08 DIAGNOSIS — Z48812 Encounter for surgical aftercare following surgery on the circulatory system: Secondary | ICD-10-CM | POA: Diagnosis not present

## 2023-07-08 DIAGNOSIS — I252 Old myocardial infarction: Secondary | ICD-10-CM | POA: Diagnosis not present

## 2023-07-08 NOTE — Progress Notes (Signed)
Daily Session Note  Patient Details  Name: Derrick Stone MRN: [id number] Date of Birth: 01-12-1951 Referring Provider:   Flowsheet Row Cardiac Rehab from 06/10/2023 in [facility] Cardiac and Pulmonary Rehab  Referring Provider Dr. Dorothyann Peng       Encounter Date: 07/08/2023  Check In:  Session Check In - 07/08/23 0755       Check-In   Supervising physician immediately available to respond to emergencies See telemetry face sheet for immediately available ER MD    Location [facility]-Cardiac & Pulmonary Rehab    Staff Present Darcel Bayley, RN,BC,MSN;Joseph Jennelle Human Katrinka Blazing, RN, Marcell Barlow, RDN, LDN    Virtual Visit No    Medication changes reported     No    Fall or balance concerns reported    No    Warm-up and Cool-down Performed on first and last piece of equipment    Resistance Training Performed Yes    VAD Patient? No    PAD/SET Patient? No      Pain Assessment   Currently in Pain? No/denies                Social History   Tobacco Use  Smoking Status Never  Smokeless Tobacco Never    Goals Met:  Independence with exercise equipment Exercise tolerated well No report of concerns or symptoms today Strength training completed today  Goals Unmet:  Not Applicable  Comments: Pt able to follow exercise prescription today without complaint.  Will continue to monitor for progression.    Dr. Bethann Punches is Medical Director for [facility] Cardiac Rehabilitation.  Dr. Vida Rigger is Medical Director for [facility] Pulmonary Rehabilitation.

## 2023-07-12 ENCOUNTER — Encounter: Payer: Medicare Other | Admitting: *Deleted

## 2023-07-12 DIAGNOSIS — I252 Old myocardial infarction: Secondary | ICD-10-CM | POA: Diagnosis not present

## 2023-07-12 DIAGNOSIS — I213 ST elevation (STEMI) myocardial infarction of unspecified site: Secondary | ICD-10-CM

## 2023-07-12 DIAGNOSIS — Z955 Presence of coronary angioplasty implant and graft: Secondary | ICD-10-CM

## 2023-07-12 DIAGNOSIS — Z48812 Encounter for surgical aftercare following surgery on the circulatory system: Secondary | ICD-10-CM | POA: Diagnosis not present

## 2023-07-12 NOTE — Progress Notes (Signed)
Daily Session Note  Patient Details  Name: Derrick Stone MRN: [id number] Date of Birth: Jan 28, 1951 Referring Provider:   Flowsheet Row Cardiac Rehab from 06/10/2023 in [facility] Cardiac and Pulmonary Rehab  Referring Provider Dr. Dorothyann Peng       Encounter Date: 07/12/2023  Check In:  Session Check In - 07/12/23 0749       Check-In   Supervising physician immediately available to respond to emergencies See telemetry face sheet for immediately available ER MD    Location [facility]-Cardiac & Pulmonary Rehab    Staff Present Cora Collum, RN, BSN, CCRP;Noah Tickle, BS, Exercise Physiologist;Joseph [place], [place]    Virtual Visit No    Medication changes reported     No    Fall or balance concerns reported    No    Warm-up and Cool-down Performed on first and last piece of equipment    Resistance Training Performed Yes    VAD Patient? No    PAD/SET Patient? No      Pain Assessment   Currently in Pain? No/denies                Social History   Tobacco Use  Smoking Status Never  Smokeless Tobacco Never    Goals Met:  Independence with exercise equipment Exercise tolerated well No report of concerns or symptoms today  Goals Unmet:  Not Applicable  Comments: Pt able to follow exercise prescription today without complaint.  Will continue to monitor for progression.    Dr. Bethann Punches is Medical Director for [facility] Cardiac Rehabilitation.  Dr. Vida Rigger is Medical Director for [facility] Pulmonary Rehabilitation.

## 2023-07-15 ENCOUNTER — Encounter: Payer: Medicare Other | Admitting: *Deleted

## 2023-07-15 DIAGNOSIS — I252 Old myocardial infarction: Secondary | ICD-10-CM | POA: Diagnosis not present

## 2023-07-15 DIAGNOSIS — I213 ST elevation (STEMI) myocardial infarction of unspecified site: Secondary | ICD-10-CM

## 2023-07-15 DIAGNOSIS — Z48812 Encounter for surgical aftercare following surgery on the circulatory system: Secondary | ICD-10-CM | POA: Diagnosis not present

## 2023-07-15 DIAGNOSIS — Z955 Presence of coronary angioplasty implant and graft: Secondary | ICD-10-CM | POA: Diagnosis not present

## 2023-07-15 NOTE — Progress Notes (Signed)
Daily Session Note  Patient Details  Name: Derrick Stone MRN: [id number] Date of Birth: [birth year] Referring Provider:   Flowsheet Row Cardiac Rehab from 06/10/2023 in [facility] Cardiac and Pulmonary Rehab  Referring Provider Dr. Dorothyann Peng       Encounter Date: 07/15/2023  Check In:  Session Check In - 07/15/23 0743       Check-In   Supervising physician immediately available to respond to emergencies See telemetry face sheet for immediately available ER MD    Location [facility]-Cardiac & Pulmonary Rehab    Staff Present Ronette Deter, BS, Exercise Physiologist;Jason Wallace [place], [place], LDN;Joseph Shelbie Proctor, RN, [place]    Virtual Visit No    Medication changes reported     No    Fall or balance concerns reported    No    Warm-up and Cool-down Performed on first and last piece of equipment    Resistance Training Performed Yes    VAD Patient? No    PAD/SET Patient? No      Pain Assessment   Currently in Pain? No/denies                Social History   Tobacco Use  Smoking Status Never  Smokeless Tobacco Never    Goals Met:  Independence with exercise equipment Exercise tolerated well No report of concerns or symptoms today Strength training completed today  Goals Unmet:  Not Applicable  Comments: Pt able to follow exercise prescription today without complaint.  Will continue to monitor for progression.    Dr. Bethann Punches is Medical Director for [facility] Cardiac Rehabilitation.  Dr. Vida Rigger is Medical Director for [facility] Pulmonary Rehabilitation.

## 2023-07-19 ENCOUNTER — Encounter: Payer: Medicare Other | Attending: Internal Medicine | Admitting: *Deleted

## 2023-07-19 DIAGNOSIS — Z955 Presence of coronary angioplasty implant and graft: Secondary | ICD-10-CM | POA: Insufficient documentation

## 2023-07-19 DIAGNOSIS — Z48812 Encounter for surgical aftercare following surgery on the circulatory system: Secondary | ICD-10-CM | POA: Diagnosis not present

## 2023-07-19 DIAGNOSIS — I252 Old myocardial infarction: Secondary | ICD-10-CM | POA: Diagnosis not present

## 2023-07-19 DIAGNOSIS — I213 ST elevation (STEMI) myocardial infarction of unspecified site: Secondary | ICD-10-CM

## 2023-07-19 NOTE — Progress Notes (Signed)
 Daily Session Note  Patient Details  Name: Derrick Stone MRN: [id number] Date of Birth: [birth year] Referring Provider:   Flowsheet Row Cardiac Rehab from 06/10/2023 in [facility] Cardiac and Pulmonary Rehab  Referring Provider Dr. Cara Lovelace       Encounter Date: 07/19/2023  Check In:  Session Check In - 07/19/23 0752       Check-In   Supervising physician immediately available to respond to emergencies See telemetry face sheet for immediately available ER MD    Location [facility]-Cardiac & Pulmonary Rehab    Staff Present Othel Durand, RN, BSN, CCRP;Noah Tickle, BS, Exercise Physiologist;Maxon Conetta BS, , Exercise Physiologist    Virtual Visit No    Medication changes reported     No    Fall or balance concerns reported    No    Warm-up and Cool-down Performed on first and last piece of equipment    Resistance Training Performed Yes    VAD Patient? No    PAD/SET Patient? No      Pain Assessment   Currently in Pain? No/denies                Social History   Tobacco Use  Smoking Status Never  Smokeless Tobacco Never    Goals Met:  Independence with exercise equipment Exercise tolerated well No report of concerns or symptoms today  Goals Unmet:  Not Applicable  Comments: Pt able to follow exercise prescription today without complaint.  Will continue to monitor for progression.    Dr. Oneil Pinal is Medical Director for [facility] Cardiac Rehabilitation.  Dr. Fuad Aleskerov is Medical Director for [facility] Pulmonary Rehabilitation.

## 2023-07-22 ENCOUNTER — Encounter: Payer: Medicare Other | Admitting: *Deleted

## 2023-07-22 DIAGNOSIS — Z955 Presence of coronary angioplasty implant and graft: Secondary | ICD-10-CM

## 2023-07-22 DIAGNOSIS — I252 Old myocardial infarction: Secondary | ICD-10-CM | POA: Diagnosis not present

## 2023-07-22 DIAGNOSIS — I213 ST elevation (STEMI) myocardial infarction of unspecified site: Secondary | ICD-10-CM

## 2023-07-22 DIAGNOSIS — Z48812 Encounter for surgical aftercare following surgery on the circulatory system: Secondary | ICD-10-CM | POA: Diagnosis not present

## 2023-07-22 NOTE — Progress Notes (Signed)
 Daily Session Note  Patient Details  Name: Derrick Stone MRN: [id number] Date of Birth: [birth year] Referring Provider:   Flowsheet Row Cardiac Rehab from 06/10/2023 in [facility] Cardiac and Pulmonary Rehab  Referring Provider Dr. Cara Lovelace       Encounter Date: 07/22/2023  Check In:  Session Check In - 07/22/23 0820       Check-In   Supervising physician immediately available to respond to emergencies See telemetry face sheet for immediately available ER MD    Location [facility]-Cardiac & Pulmonary Rehab    Staff Present Selinda Pereyra, RDN, LDN;Joseph Rolinda, RCP,RRT,BSRT;Kelly Dyane, BS, ACSM CEP, Exercise Physiologist;Levester Waldridge Claudene, RN, ADN    Virtual Visit No    Medication changes reported     No    Fall or balance concerns reported    No    Warm-up and Cool-down Performed on first and last piece of equipment    Resistance Training Performed Yes    VAD Patient? No    PAD/SET Patient? No      Pain Assessment   Currently in Pain? No/denies                Social History   Tobacco Use  Smoking Status Never  Smokeless Tobacco Never    Goals Met:  Independence with exercise equipment Exercise tolerated well No report of concerns or symptoms today Strength training completed today  Goals Unmet:  Not Applicable  Comments: Pt able to follow exercise prescription today without complaint.  Will continue to monitor for progression.    Dr. Oneil Pinal is Medical Director for [facility] Cardiac Rehabilitation.  Dr. Fuad Aleskerov is Medical Director for [facility] Pulmonary Rehabilitation.

## 2023-07-24 ENCOUNTER — Encounter: Payer: Medicare Other | Admitting: *Deleted

## 2023-07-24 DIAGNOSIS — I252 Old myocardial infarction: Secondary | ICD-10-CM | POA: Diagnosis not present

## 2023-07-24 DIAGNOSIS — Z48812 Encounter for surgical aftercare following surgery on the circulatory system: Secondary | ICD-10-CM | POA: Diagnosis not present

## 2023-07-24 DIAGNOSIS — I213 ST elevation (STEMI) myocardial infarction of unspecified site: Secondary | ICD-10-CM

## 2023-07-24 DIAGNOSIS — Z955 Presence of coronary angioplasty implant and graft: Secondary | ICD-10-CM

## 2023-07-24 NOTE — Progress Notes (Signed)
 Daily Session Note  Patient Details  Name: Derrick Stone MRN: [id number] Date of Birth: Jul 29, 1950 Referring Provider:   Flowsheet Row Cardiac Rehab from 06/10/2023 in [facility] Cardiac and Pulmonary Rehab  Referring Provider Dr. Cara Lovelace       Encounter Date: 07/24/2023  Check In:  Session Check In - 07/24/23 0728       Check-In   Supervising physician immediately available to respond to emergencies See telemetry face sheet for immediately available ER MD    Location [facility]-Cardiac & Pulmonary Rehab    Staff Present Devaughn Jaeger, BS, Exercise Physiologist;Susanne Bice, RN, BSN, CCRP;Joseph [place], [place] Arzella Claudene, RN, [place]    Virtual Visit No    Medication changes reported     No    Fall or balance concerns reported    No    Warm-up and Cool-down Performed on first and last piece of equipment    Resistance Training Performed Yes    VAD Patient? No    PAD/SET Patient? No      Pain Assessment   Currently in Pain? No/denies                Social History   Tobacco Use  Smoking Status Never  Smokeless Tobacco Never    Goals Met:  Independence with exercise equipment Exercise tolerated well No report of concerns or symptoms today Strength training completed today  Goals Unmet:  Not Applicable  Comments: Pt able to follow exercise prescription today without complaint.  Will continue to monitor for progression.    Dr. Oneil Pinal is Medical Director for [facility] Cardiac Rehabilitation.  Dr. Fuad Aleskerov is Medical Director for [facility] Pulmonary Rehabilitation.

## 2023-07-26 ENCOUNTER — Encounter: Payer: Medicare Other | Admitting: *Deleted

## 2023-07-26 DIAGNOSIS — Z955 Presence of coronary angioplasty implant and graft: Secondary | ICD-10-CM | POA: Diagnosis not present

## 2023-07-26 DIAGNOSIS — Z48812 Encounter for surgical aftercare following surgery on the circulatory system: Secondary | ICD-10-CM | POA: Diagnosis not present

## 2023-07-26 DIAGNOSIS — I213 ST elevation (STEMI) myocardial infarction of unspecified site: Secondary | ICD-10-CM

## 2023-07-26 DIAGNOSIS — I252 Old myocardial infarction: Secondary | ICD-10-CM | POA: Diagnosis not present

## 2023-07-26 NOTE — Progress Notes (Signed)
 Daily Session Note  Patient Details  Name: Derrick Stone MRN: [id number] Date of Birth: 08-06-1950 Referring Provider:   Flowsheet Row Cardiac Rehab from 06/10/2023 in [facility] Cardiac and Pulmonary Rehab  Referring Provider Dr. Cara Lovelace       Encounter Date: 07/26/2023  Check In:  Session Check In - 07/26/23 0818       Check-In   Supervising physician immediately available to respond to emergencies See telemetry face sheet for immediately available ER MD    Location [facility]-Cardiac & Pulmonary Rehab    Staff Present Othel Durand, RN, BSN, CCRP;Noah Tickle, BS, Exercise Physiologist;Joseph [place], [place]    Virtual Visit No    Medication changes reported     No    Fall or balance concerns reported    No    Warm-up and Cool-down Performed on first and last piece of equipment    Resistance Training Performed Yes    VAD Patient? No    PAD/SET Patient? No      Pain Assessment   Currently in Pain? No/denies                Social History   Tobacco Use  Smoking Status Never  Smokeless Tobacco Never    Goals Met:  Independence with exercise equipment Exercise tolerated well No report of concerns or symptoms today  Goals Unmet:  Not Applicable  Comments: Pt able to follow exercise prescription today without complaint.  Will continue to monitor for progression.    Dr. Oneil Pinal is Medical Director for [facility] Cardiac Rehabilitation.  Dr. Fuad Aleskerov is Medical Director for [facility] Pulmonary Rehabilitation.

## 2023-07-29 DIAGNOSIS — I213 ST elevation (STEMI) myocardial infarction of unspecified site: Secondary | ICD-10-CM

## 2023-07-29 DIAGNOSIS — I252 Old myocardial infarction: Secondary | ICD-10-CM | POA: Diagnosis not present

## 2023-07-29 DIAGNOSIS — Z955 Presence of coronary angioplasty implant and graft: Secondary | ICD-10-CM

## 2023-07-29 DIAGNOSIS — Z48812 Encounter for surgical aftercare following surgery on the circulatory system: Secondary | ICD-10-CM | POA: Diagnosis not present

## 2023-07-29 NOTE — Progress Notes (Signed)
 Daily Session Note  Patient Details  Name: LAKOTA MARKGRAF MRN: [id number] Date of Birth: 30-Aug-1950 Referring Provider:   Flowsheet Row Cardiac Rehab from 06/10/2023 in [facility] Cardiac and Pulmonary Rehab  Referring Provider Dr. Cara Lovelace       Encounter Date: 07/29/2023  Check In:  Session Check In - 07/29/23 1109       Check-In   Supervising physician immediately available to respond to emergencies See telemetry face sheet for immediately available ER MD    Location [facility]-Cardiac & Pulmonary Rehab    Staff Present Maxon Conetta BS, , Exercise Physiologist;Ellington [place] Dyane, BS, ACSM CEP, Exercise Physiologist;Margaret Best, MS, Exercise Physiologist;Other   Burnard Davenport, RN   Virtual Visit No    Medication changes reported     No    Fall or balance concerns reported    No    Tobacco Cessation No Change    Warm-up and Cool-down Performed on first and last piece of equipment    Resistance Training Performed Yes    VAD Patient? No    PAD/SET Patient? No      Pain Assessment   Currently in Pain? No/denies                Social History   Tobacco Use  Smoking Status Never  Smokeless Tobacco Never    Goals Met:  Independence with exercise equipment Exercise tolerated well No report of concerns or symptoms today Strength training completed today  Goals Unmet:  Not Applicable  Comments: Pt able to follow exercise prescription today without complaint.  Will continue to monitor for progression.    Dr. Oneil Pinal is Medical Director for [facility] Cardiac Rehabilitation.  Dr. Fuad Aleskerov is Medical Director for [facility] Pulmonary Rehabilitation.

## 2023-07-31 ENCOUNTER — Encounter: Payer: Self-pay | Admitting: *Deleted

## 2023-07-31 ENCOUNTER — Encounter: Payer: Medicare Other | Admitting: *Deleted

## 2023-07-31 DIAGNOSIS — Z955 Presence of coronary angioplasty implant and graft: Secondary | ICD-10-CM

## 2023-07-31 DIAGNOSIS — Z48812 Encounter for surgical aftercare following surgery on the circulatory system: Secondary | ICD-10-CM | POA: Diagnosis not present

## 2023-07-31 DIAGNOSIS — I213 ST elevation (STEMI) myocardial infarction of unspecified site: Secondary | ICD-10-CM

## 2023-07-31 DIAGNOSIS — I252 Old myocardial infarction: Secondary | ICD-10-CM | POA: Diagnosis not present

## 2023-07-31 NOTE — Progress Notes (Signed)
 Cardiac Individual Treatment Plan  Patient Details  Name: Derrick Stone MRN: [id number] Date of Birth: 02-08-1951 Referring Provider:   Flowsheet Row Cardiac Rehab from 06/10/2023 in [facility] Cardiac and Pulmonary Rehab  Referring Provider Dr. Burney Carter       Initial Encounter Date:  Flowsheet Row Cardiac Rehab from 06/10/2023 in [facility] Cardiac and Pulmonary Rehab  Date 06/10/23       Visit Diagnosis: ST elevation myocardial infarction (STEMI), unspecified artery [facility])  Status post coronary artery stent placement  Patient's Home Medications on Admission:  Current Outpatient Medications:    aspirin  EC 81 MG tablet, Take 81 mg by mouth daily., Disp: , Rfl:    atorvastatin  (LIPITOR ) 80 MG tablet, Take 1 tablet (80 mg total) by mouth daily., Disp: 90 tablet, Rfl: 1   clopidogrel  (PLAVIX ) 75 MG tablet, Take 1 tablet (75 mg total) by mouth daily., Disp: 30 tablet, Rfl: 0   escitalopram  (LEXAPRO ) 10 MG tablet, TAKE 1 TABLET (10 MG TOTAL) BY MOUTH ONCE DAILY., Disp: , Rfl: 1   ezetimibe  (ZETIA ) 10 MG tablet, Take 1 tablet (10 mg total) by mouth daily., Disp: 90 tablet, Rfl: 3   losartan  (COZAAR ) 25 MG tablet, Take 1 tablet (25 mg total) by mouth daily. (Patient taking differently: Take 12.5 mg by mouth daily.), Disp: 30 tablet, Rfl: 1   metoprolol  succinate (TOPROL -XL) 25 MG 24 hr tablet, Take 1 tablet by mouth daily., Disp: , Rfl:    mupirocin  ointment (BACTROBAN ) 2 %, Apply daily with bandage change, Disp: 22 g, Rfl: 1   Omega-3 Fatty Acids (FISH OIL PO), Take 1 capsule by mouth daily., Disp: , Rfl:    omeprazole  (PRILOSEC  OTC) 20 MG tablet, Take 1 tablet by mouth daily., Disp: , Rfl:   Past Medical History: Past Medical History:  Diagnosis Date   CAD (coronary artery disease)    History of chickenpox    History of mumps    Hyperlipidemia    Hypertension    NSTEMI (non-ST elevated myocardial infarction) (HCC) 09/19/2020    Tobacco Use: Social History   Tobacco Use  Smoking  Status Never  Smokeless Tobacco Never    Labs: Review Flowsheet  More data exists      Latest Ref Rng & Units 09/20/2020 09/21/2020 10/25/2020 12/06/2021 01/30/2023  Labs for ITP Cardiac and Pulmonary Rehab  Cholestrol 100 - 199 mg/dL 409  - 811  914  782   LDL (calc) 0 - 99 mg/dL 90  - 72  92  87   HDL-C >39 mg/dL 36  - 33  40  45   Trlycerides 0 - 149 mg/dL 92  - 956  213  086   Hemoglobin A1c 4.8 - 5.6 % - 6.1  - 6.1  6.0      Exercise Target Goals: Exercise Program Goal: Individual exercise prescription set using results from initial 6 min walk test and THRR while considering  patient's activity barriers and safety.   Exercise Prescription Goal: Initial exercise prescription builds to 30-45 minutes a day of aerobic activity, 2-3 days per week.  Home exercise guidelines will be given to patient during program as part of exercise prescription that the participant will acknowledge.   Education: Aerobic Exercise: - Group verbal and visual presentation on the components of exercise prescription. Introduces F.I.T.T principle from ACSM for exercise prescriptions.  Reviews F.I.T.T. principles of aerobic exercise including progression. Written material given at graduation. Flowsheet Row Cardiac Rehab from 07/31/2023 in [facility] Cardiac and Pulmonary  Rehab  Education need identified 06/17/23  Date 07/24/23  Educator NT  Instruction Review Code 1- Verbalizes Understanding       Education: Resistance Exercise: - Group verbal and visual presentation on the components of exercise prescription. Introduces F.I.T.T principle from ACSM for exercise prescriptions  Reviews F.I.T.T. principles of resistance exercise including progression. Written material given at graduation. Flowsheet Row Cardiac Rehab from 07/31/2023 in [facility] Cardiac and Pulmonary Rehab  Date 07/15/23  Educator [place]  Instruction Review Code 1- Verbalizes Understanding        Education: Exercise & Equipment Safety: - Individual  verbal instruction and demonstration of equipment use and safety with use of the equipment. Flowsheet Row Cardiac Rehab from 07/31/2023 in [facility] Cardiac and Pulmonary Rehab  Date 06/17/23  Educator [facility]  Instruction Review Code 1- Verbalizes Understanding       Education: Exercise Physiology & General Exercise Guidelines: - Group verbal and written instruction with models to review the exercise physiology of the cardiovascular system and associated critical values. Provides general exercise guidelines with specific guidelines to those with heart or lung disease.  Flowsheet Row Cardiac Rehab from 07/31/2023 in [facility] Cardiac and Pulmonary Rehab  Date 06/17/23  Educator [facility]  Instruction Review Code 1- [employer] Understanding       Education: Flexibility, Balance, Mind/Body Relaxation: - Group verbal and visual presentation with interactive activity on the components of exercise prescription. Introduces F.I.T.T principle from ACSM for exercise prescriptions. Reviews F.I.T.T. principles of flexibility and balance exercise training including progression. Also discusses the mind body connection.  Reviews various relaxation techniques to help reduce and manage stress (i.e. Deep breathing, progressive muscle relaxation, and visualization). Balance handout provided to take home. Written material given at graduation. Flowsheet Row Cardiac Rehab from 07/31/2023 in [facility] Cardiac and Pulmonary Rehab  Date 07/15/23  Educator [place]  Instruction Review Code 1- Verbalizes Understanding       Activity Barriers & Risk Stratification:  Activity Barriers & Cardiac Risk Stratification - 06/10/23 1149       Activity Barriers & Cardiac Risk Stratification   Activity Barriers Other (comment)    Comments 10 years ago- fell off ladder- sometimes left foot gets aggravated    Cardiac Risk Stratification High             6 Minute Walk:  6 Minute Walk     Row Name 06/10/23 1146         6 Minute Walk   Phase  Initial     Distance 1470 feet     Walk Time 6 minutes     # of Rest Breaks 0     MPH 2.8     METS 3.11     RPE 7     Perceived Dyspnea  0     VO2 Peak 10.89     Symptoms No     Resting HR 84 bpm     Resting BP 116/66     Resting Oxygen Saturation  98 %     Exercise Oxygen Saturation  during 6 min walk 98 %     Max Ex. HR 98 bpm     Max Ex. BP 120/62     2 Minute Post BP 116/64              Oxygen Initial Assessment:   Oxygen Re-Evaluation:   Oxygen Discharge (Final Oxygen Re-Evaluation):   Initial Exercise Prescription:  Initial Exercise Prescription - 06/10/23 1100       Date of  Initial Exercise RX and Referring Provider   Date 06/10/23    Referring Provider Dr. Burney Carter      Oxygen   Maintain Oxygen Saturation 88% or higher      Treadmill   MPH 2.8    Grade 1    Minutes 15    METs 3.53      Elliptical   Level 1    Speed 1    Minutes 15    METs 3.11      REL-XR   Level 3    Speed 50    Minutes 15    METs 3.11      Prescription Details   Duration Progress to 30 minutes of continuous aerobic without signs/symptoms of physical distress      Intensity   THRR 40-80% of Max Heartrate 110-136    Ratings of Perceived Exertion 11-13    Perceived Dyspnea 0-4      Progression   Progression Continue to progress workloads to maintain intensity without signs/symptoms of physical distress.      Resistance Training   Training Prescription Yes    Weight 5 lb    Reps 10-15             Perform Capillary Blood Glucose checks as needed.  Exercise Prescription Changes:   Exercise Prescription Changes     Row Name 06/10/23 1100 06/25/23 1500 07/11/23 1600 07/25/23 0900       Response to Exercise   Blood Pressure (Admit) 116/66 118/56 102/62 102/62    Blood Pressure (Exercise) 120/62 132/58 132/66 --    Blood Pressure (Exit) 116/64 102/56 106/64 102/64    Heart Rate (Admit) 84 bpm 75 bpm 87 bpm 102 bpm    Heart Rate (Exercise) 98 bpm  130 bpm 140 bpm 131 bpm    Heart Rate (Exit) 86 bpm 89 bpm 87 bpm 97 bpm    Oxygen Saturation (Admit) 98 % -- -- --    Oxygen Saturation (Exercise) 98 % -- -- --    Oxygen Saturation (Exit) 98 % -- -- --    Rating of Perceived Exertion (Exercise) 7 15 14 13     Perceived Dyspnea (Exercise) 0 0 -- 0    Symptoms none none none none    Comments results -- -- --    Duration -- Progress to 30 minutes of  aerobic without signs/symptoms of physical distress Continue with 30 min of aerobic exercise without signs/symptoms of physical distress. Continue with 30 min of aerobic exercise without signs/symptoms of physical distress.    Intensity -- THRR unchanged THRR unchanged THRR unchanged      Progression   Progression -- Continue to progress workloads to maintain intensity without signs/symptoms of physical distress. Continue to progress workloads to maintain intensity without signs/symptoms of physical distress. Continue to progress workloads to maintain intensity without signs/symptoms of physical distress.    Average METs 3.11 3.8 4.77 5.53      Resistance Training   Training Prescription -- Yes Yes Yes    Weight -- 5 lb 5 lb 5 lb    Reps -- 10-15 10-15 10-15      Interval Training   Interval Training -- No No No      Treadmill   MPH -- 3.2 3.1 3.3    Grade -- 3 6 6     Minutes -- 15 15 15     METs -- 4.77 5.94 6.25      Elliptical   Level -- 2  3 5    Speed -- 1 3 3.1    Minutes -- 15 15 15     METs -- 3.5 3.4 4.6      Oxygen   Maintain Oxygen Saturation -- 88% or higher 88% or higher 88% or higher             Exercise Comments:   Exercise Comments     Row Name 06/17/23 0932           Exercise Comments First full day of exercise!  Patient was oriented to gym and equipment including functions, settings, policies, and procedures.  Patient's individual exercise prescription and treatment plan were reviewed.  All starting workloads were established based on the results of  the 6 minute walk test done at initial orientation visit.  The plan for exercise progression was also introduced and progression will be customized based on patient's performance and goals.                Exercise Goals and Review:   Exercise Goals     Row Name 06/10/23 1154             Exercise Goals   Increase Physical Activity Yes       Expected Outcomes Long Term: Exercising regularly at least 3-5 days a week.;Long Term: Add in home exercise to make exercise part of routine and to increase amount of physical activity.;Short Term: Attend rehab on a regular basis to increase amount of physical activity.       Increase Strength and Stamina Yes       Intervention Provide advice, education, support and counseling about physical activity/exercise needs.;Develop an individualized exercise prescription for aerobic and resistive training based on initial evaluation findings, risk stratification, comorbidities and participant's personal goals.       Expected Outcomes Long Term: Improve cardiorespiratory fitness, muscular endurance and strength as measured by increased METs and functional capacity ( );Short Term: Perform resistance training exercises routinely during rehab and add in resistance training at home;Short Term: Increase workloads from initial exercise prescription for resistance, speed, and METs.       Able to understand and use rate of perceived exertion (RPE) scale Yes       Intervention Provide education and explanation on how to use RPE scale       Expected Outcomes Long Term:  Able to use RPE to guide intensity level when exercising independently;Short Term: Able to use RPE daily in rehab to express subjective intensity level       Able to understand and use Dyspnea scale Yes       Intervention Provide education and explanation on how to use Dyspnea scale       Expected Outcomes Long Term: Able to use Dyspnea scale to guide intensity level when exercising independently;Short  Term: Able to use Dyspnea scale daily in rehab to express subjective sense of shortness of breath during exertion       Knowledge and understanding of Target Heart Rate Range (THRR) Yes       Intervention Provide education and explanation of THRR including how the numbers were predicted and where they are located for reference       Expected Outcomes Long Term: Able to use THRR to govern intensity when exercising independently;Short Term: Able to use daily as guideline for intensity in rehab;Short Term: Able to state/look up THRR       Able to check pulse independently Yes       Intervention Review  the importance of being able to check your own pulse for safety during independent exercise;Provide education and demonstration on how to check pulse in carotid and radial arteries.       Expected Outcomes Long Term: Able to check pulse independently and accurately;Short Term: Able to explain why pulse checking is important during independent exercise       Understanding of Exercise Prescription Yes       Intervention Provide education, explanation, and written materials on patient's individual exercise prescription       Expected Outcomes Long Term: Able to explain home exercise prescription to exercise independently;Short Term: Able to explain program exercise prescription                Exercise Goals Re-Evaluation :  Exercise Goals Re-Evaluation     Row Name 06/17/23 0933 06/25/23 1531 07/11/23 1618 07/25/23 0954       Exercise Goal Re-Evaluation   Exercise Goals Review Able to understand and use rate of perceived exertion (RPE) scale;Able to understand and use Dyspnea scale;Knowledge and understanding of Target Heart Rate Range (THRR);Understanding of Exercise Prescription Increase Physical Activity;Increase Strength and Stamina;Understanding of Exercise Prescription Increase Physical Activity;Increase Strength and Stamina;Understanding of Exercise Prescription Increase Physical  Activity;Increase Strength and Stamina;Understanding of Exercise Prescription    Comments Reviewed RPE and dyspnea scale, THR and program prescription with pt today.  Pt voiced understanding and was given a copy of goals to take home. Atsushi is off to a good start in the program. He has already made increases to hisinitial  exercise prescription. He was able to increase his level on the elliptical from level 1 to 2. He was also able to increase his speed on the treadmill from 2.8 to 3.2 mph. We will continue to monitor his progress in the program. Nalan is doing well in rehab. He recently increased his treadmill workload to a speed of 3.1 mph with an incline of 6%. He also improved to level 3 on the elliptical at a speed of 3 mph. We will continue to monitor his progress in the program. Flossie continues to do well in rehab. He was recently able to increase his speed on the treadmill from 3. to 3.3 mph while maintaining an incline of 6%. He was also able to increase from level 3 to 5 on the elliptical. We will continue to monitor his progress in the program.    Expected Outcomes Short: Use RPE daily to regulate intensity. Long: Follow program prescription in THR. Short: Continue to follow current exercise prescription. Long: Continue exercise to improve strength and stamina. Short: Continue to progressively increase treadmill workload. Long: Continue exercise to improve strength and stamina. Short: Continue to progressively increase treadmill and elliptical workload. Long: Continue exercise to improve strength and stamina.             Discharge Exercise Prescription (Final Exercise Prescription Changes):  Exercise Prescription Changes - 07/25/23 0900       Response to Exercise   Blood Pressure (Admit) 102/62    Blood Pressure (Exit) 102/64    Heart Rate (Admit) 102 bpm    Heart Rate (Exercise) 131 bpm    Heart Rate (Exit) 97 bpm    Rating of Perceived Exertion (Exercise) 13    Perceived Dyspnea  (Exercise) 0    Symptoms none    Duration Continue with 30 min of aerobic exercise without signs/symptoms of physical distress.    Intensity THRR unchanged      Progression  Progression Continue to progress workloads to maintain intensity without signs/symptoms of physical distress.    Average METs 5.53      Resistance Training   Training Prescription Yes    Weight 5 lb    Reps 10-15      Interval Training   Interval Training No      Treadmill   MPH 3.3    Grade 6    Minutes 15    METs 6.25      Elliptical   Level 5    Speed 3.1    Minutes 15    METs 4.6      Oxygen   Maintain Oxygen Saturation 88% or higher             Nutrition:  Target Goals: Understanding of nutrition guidelines, daily intake of sodium 1500mg , cholesterol 200mg , calories 30% from fat and 7% or less from saturated fats, daily to have 5 or more servings of fruits and vegetables.  Education: All About Nutrition: -Group instruction provided by verbal, written material, interactive activities, discussions, models, and posters to present general guidelines for heart healthy nutrition including fat, fiber, MyPlate, the role of sodium in heart healthy nutrition, utilization of the nutrition label, and utilization of this knowledge for meal planning. Follow up email sent as well. Written material given at graduation. Flowsheet Row Cardiac Rehab from 07/31/2023 in [facility] Cardiac and Pulmonary Rehab  Education need identified 06/17/23  Date 07/31/23  Educator JG  Instruction Review Code 1- Verbalizes Understanding       Biometrics:  Pre Biometrics - 06/10/23 1154       Pre Biometrics   Height 6' 1.4" (1.864 m)    Weight 219 lb 6.4 oz (99.5 kg)    Waist Circumference 42 inches    Hip Circumference 41 inches    Waist to Hip Ratio 1.02 %    BMI (Calculated) 28.64    Single Leg Stand 30 seconds              Nutrition Therapy Plan and Nutrition Goals:  Nutrition Therapy & Goals - 06/10/23  1329       Nutrition Therapy   Diet carb controlled, cardiac, low Na    Protein (specify units) 90    Fiber 30 grams    Whole Grain Foods 3 servings    Saturated Fats 15 max. grams    Fruits and Vegetables 5 servings/day    Sodium 2 grams      Personal Nutrition Goals   Nutrition Goal Eat 15-30gProtein and 30-60gCarbs at each meal.    Personal Goal #2 Drink 64oz of water, cut out sugary beverages    Personal Goal #3 Reduce saturated fat, less than 12g per day. Replace bad fats for more heart healthy fats.    Comments Patient drinking adequate water. ~64oz per day. He was drinking sprite and orange juice as well. After speaking with him he will quit drinking sugary beverages in favor of plain water. Educated him on types of carbs and quantity control with recommended portions of ~30-60g each meal. He reports sometimes he is not very hungry. Explained the importance of protein carb pairing. Brainstormed some smaller easy to make meals and snacks for him to try. Reviewed Mediterranean diet handout, educated on types of fats, sources, and how to read facts label. Built out several meals and snacks with foods he likes and will eat. Went over several of his preferred foods and recommended changes he could try to implement to  improve his heart health.      Intervention Plan   Intervention Prescribe, educate and counsel regarding individualized specific dietary modifications aiming towards targeted core components such as weight, hypertension, lipid management, diabetes, heart failure and other comorbidities.;Nutrition handout(s) given to patient.    Expected Outcomes Short Term Goal: Understand basic principles of dietary content, such as calories, fat, sodium, cholesterol and nutrients.;Short Term Goal: A plan has been developed with personal nutrition goals set during dietitian appointment.;Long Term Goal: Adherence to prescribed nutrition plan.             Nutrition Assessments:  MEDIFICTS  Score Key: >=70 Need to make dietary changes  40-70 Heart Healthy Diet <= 40 Therapeutic Level Cholesterol Diet  Flowsheet Row Cardiac Rehab from 06/28/2023 in [facility] Cardiac and Pulmonary Rehab  Picture Your Plate Total Score on Admission 65      Picture Your Plate Scores: <16 Unhealthy dietary pattern with much room for improvement. 41-50 Dietary pattern unlikely to meet recommendations for good health and room for improvement. 51-60 More healthful dietary pattern, with some room for improvement.  >60 Healthy dietary pattern, although there may be some specific behaviors that could be improved.    Nutrition Goals Re-Evaluation:  Nutrition Goals Re-Evaluation     Row Name 07/12/23 0748             Goals   Current Weight 212 lb (96.2 kg)       Comment Mychael eats nothing fried and has limited his bread intake. He loves sweets and has cut back dramatically. Karol is working towards losing some weight.       Expected Outcome Short: adhere to a diet plan where he can lose a few pounds. Long: maintain a diet that pertains to him independently.                Nutrition Goals Discharge (Final Nutrition Goals Re-Evaluation):  Nutrition Goals Re-Evaluation - 07/12/23 0748       Goals   Current Weight 212 lb (96.2 kg)    Comment Clois eats nothing fried and has limited his bread intake. He loves sweets and has cut back dramatically. Lamin is working towards losing some weight.    Expected Outcome Short: adhere to a diet plan where he can lose a few pounds. Long: maintain a diet that pertains to him independently.             Psychosocial: Target Goals: Acknowledge presence or absence of significant depression and/or stress, maximize coping skills, provide positive support system. Participant is able to verbalize types and ability to use techniques and skills needed for reducing stress and depression.   Education: Stress, Anxiety, and Depression - Group verbal and visual  presentation to define topics covered.  Reviews how body is impacted by stress, anxiety, and depression.  Also discusses healthy ways to reduce stress and to treat/manage anxiety and depression.  Written material given at graduation. Flowsheet Row Cardiac Rehab from 07/31/2023 in [facility] Cardiac and Pulmonary Rehab  Date 07/03/23  Educator SB  Instruction Review Code 1- [employer] Understanding       Education: Sleep Hygiene -Provides group verbal and written instruction about how sleep can affect your health.  Define sleep hygiene, discuss sleep cycles and impact of sleep habits. Review good sleep hygiene tips.    Initial Review & Psychosocial Screening:  Initial Psych Review & Screening - 06/06/23 1340       Initial Review   Current issues with Current Stress  Concerns    Source of Stress Concerns Chronic Illness    Comments 3rd MI      Family Dynamics   Good Support System? Yes   wife, family     Barriers   Psychosocial barriers to participate in program There are no identifiable barriers or psychosocial needs.      Screening Interventions   Interventions Encouraged to exercise;Provide feedback about the scores to participant;To provide support and resources with identified psychosocial needs    Expected Outcomes Short Term goal: Utilizing psychosocial counselor, staff and physician to assist with identification of specific Stressors or current issues interfering with healing process. Setting desired goal for each stressor or current issue identified.;Long Term Goal: Stressors or current issues are controlled or eliminated.;Short Term goal: Identification and review with participant of any Quality of Life or Depression concerns found by scoring the questionnaire.;Long Term goal: The participant improves quality of Life and PHQ9 Scores as seen by post scores and/or verbalization of changes             Quality of Life Scores:   Quality of Life - 06/17/23 0852       Quality of  Life   Select Quality of Life      Quality of Life Scores   Health/Function Pre 28.29 %    Socioeconomic Pre 30 %    Psych/Spiritual Pre 30 %    Family Pre 30 %    GLOBAL Pre 29.29 %            Scores of 19 and below usually indicate a poorer quality of life in these areas.  A difference of  2-3 points is a clinically meaningful difference.  A difference of 2-3 points in the total score of the Quality of Life Index has been associated with significant improvement in overall quality of life, self-image, physical symptoms, and general health in studies assessing change in quality of life.  PHQ-9: Review Flowsheet  More data exists      06/10/2023 01/30/2023 12/25/2022 12/06/2021 10/24/2020  Depression screen PHQ 2/9  Decreased Interest 0 0 0 0 0  Down, Depressed, Hopeless 0 0 0 0 0  PHQ - 2 Score 0 0 0 0 0  Altered sleeping 0 - - 0 0  Tired, decreased energy 1 - - 0 0  Change in appetite 0 - - 0 0  Feeling bad or failure about yourself  0 - - 0 0  Trouble concentrating 0 - - 0 0  Moving slowly or fidgety/restless 0 - - 0 0  Suicidal thoughts 0 - - 0 0  PHQ-9 Score 1 - - 0 0  Difficult doing work/chores Not difficult at all - - Not difficult at all Not difficult at all   Interpretation of Total Score  Total Score Depression Severity:  1-4 = Minimal depression, 5-9 = Mild depression, 10-14 = Moderate depression, 15-19 = Moderately severe depression, 20-27 = Severe depression   Psychosocial Evaluation and Intervention:  Psychosocial Evaluation - 06/06/23 1349       Psychosocial Evaluation & Interventions   Interventions Encouraged to exercise with the program and follow exercise prescription;Stress management education    Comments 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 He and his wife are concerned about his health and he is very motivated to participate in the program. He enjoys golfing and spending time with his family. His wife and family are his main support  system. He does take Tylenol  pm at  night to help him sleep and states this is still working for him, but he has been a restless sleeper for a while now. He is ready to get started to learn more about heart healthy living.    Expected Outcomes Short: attend cardiac rehab for education and exercise. Long: develop and maintain positive self care habits    Continue Psychosocial Services  Follow up required by staff             Psychosocial Re-Evaluation:  Psychosocial Re-Evaluation     Row Name 07/12/23 [phone]             Psychosocial Re-Evaluation   Current issues with None Identified       Comments Patient reports no issues with their current mental states, sleep, stress, depression or anxiety. Will follow up with patient in a few weeks for any changes.       Expected Outcomes Short: Continue to exercise regularly to support mental health and notify staff of any changes. Long: maintain mental health and well being through teaching of rehab or prescribed medications independently.       Interventions Encouraged to attend Cardiac Rehabilitation for the exercise       Continue Psychosocial Services  Follow up required by staff                Psychosocial Discharge (Final Psychosocial Re-Evaluation):  Psychosocial Re-Evaluation - 07/12/23 0747       Psychosocial Re-Evaluation   Current issues with None Identified    Comments Patient reports no issues with their current mental states, sleep, stress, depression or anxiety. Will follow up with patient in a few weeks for any changes.    Expected Outcomes Short: Continue to exercise regularly to support mental health and notify staff of any changes. Long: maintain mental health and well being through teaching of rehab or prescribed medications independently.    Interventions Encouraged to attend Cardiac Rehabilitation for the exercise    Continue Psychosocial Services  Follow up required by staff             Vocational  Rehabilitation: Provide vocational rehab assistance to qualifying candidates.   Vocational Rehab Evaluation & Intervention:  Vocational Rehab - 06/06/23 1338       Initial Vocational Rehab Evaluation & Intervention   Assessment shows need for Vocational Rehabilitation No             Education: Education Goals: Education classes will be provided on a variety of topics geared toward better understanding of heart health and risk factor modification. Participant will state understanding/return demonstration of topics presented as noted by education test scores.  Learning Barriers/Preferences:  Learning Barriers/Preferences - 06/06/23 1338       Learning Barriers/Preferences   Learning Barriers None    Learning Preferences None             General Cardiac Education Topics:  AED/CPR: - Group verbal and written instruction with the use of models to demonstrate the basic use of the AED with the basic ABC's of resuscitation.   Anatomy and Cardiac Procedures: - Group verbal and visual presentation and models provide information about basic cardiac anatomy and function. Reviews the testing methods done to diagnose heart disease and the outcomes of the test results. Describes the treatment choices: Medical Management, Angioplasty, or Coronary Bypass Surgery for treating various heart conditions including Myocardial Infarction, Angina, Valve Disease, and Cardiac Arrhythmias.  Written material given at graduation. Flowsheet Row Cardiac Rehab from 07/31/2023 in  [facility] Cardiac and Pulmonary Rehab  Education need identified 06/17/23       Medication Safety: - Group verbal and visual instruction to review commonly prescribed medications for heart and lung disease. Reviews the medication, class of the drug, and side effects. Includes the steps to properly store meds and maintain the prescription regimen.  Written material given at graduation.   Intimacy: - Group verbal instruction  through game format to discuss how heart and lung disease can affect sexual intimacy. Written material given at graduation.. Flowsheet Row Cardiac Rehab from 07/31/2023 in [facility] Cardiac and Pulmonary Rehab  Date 07/24/23  Educator NT  Instruction Review Code 1- Verbalizes Understanding       Know Your Numbers and Heart Failure: - Group verbal and visual instruction to discuss disease risk factors for cardiac and pulmonary disease and treatment options.  Reviews associated critical values for Overweight/Obesity, Hypertension, Cholesterol, and Diabetes.  Discusses basics of heart failure: signs/symptoms and treatments.  Introduces Heart Failure Zone chart for action plan for heart failure.  Written material given at graduation. Flowsheet Row Cardiac Rehab from 07/31/2023 in [facility] Cardiac and Pulmonary Rehab  Education need identified 06/17/23  Date 06/19/23  Educator SB  Instruction Review Code 1- Verbalizes Understanding       Infection Prevention: - Provides verbal and written material to individual with discussion of infection control including proper hand washing and proper equipment cleaning during exercise session. Flowsheet Row Cardiac Rehab from 07/31/2023 in [facility] Cardiac and Pulmonary Rehab  Date 06/17/23  Educator [facility]  Instruction Review Code 1- Verbalizes Understanding       Falls Prevention: - Provides verbal and written material to individual with discussion of falls prevention and safety. Flowsheet Row Cardiac Rehab from 07/31/2023 in [facility] Cardiac and Pulmonary Rehab  Date 06/17/23  Educator [facility]  Instruction Review Code 1- Verbalizes Understanding       Other: -Provides group and verbal instruction on various topics (see comments)   Knowledge Questionnaire Score:  Knowledge Questionnaire Score - 06/17/23 0853       Knowledge Questionnaire Score   Pre Score 21/26             Core Components/Risk Factors/Patient Goals at Admission:  Personal Goals and Risk  Factors at Admission - 06/10/23 1155       Core Components/Risk Factors/Patient Goals on Admission    Weight Management Weight Loss    Hypertension Yes    Intervention Provide education on lifestyle modifcations including regular physical activity/exercise, weight management, moderate sodium restriction and increased consumption of fresh fruit, vegetables, and low fat dairy, alcohol moderation, and smoking cessation.;Monitor prescription use compliance.    Expected Outcomes Short Term: Continued assessment and intervention until BP is < 140/33mm HG in hypertensive participants. < 130/37mm HG in hypertensive participants with diabetes, heart failure or chronic kidney disease.;Long Term: Maintenance of blood pressure at goal levels.    Lipids Yes    Intervention Provide education and support for participant on nutrition & aerobic/resistive exercise along with prescribed medications to achieve LDL 70mg , HDL >40mg .    Expected Outcomes Short Term: Participant states understanding of desired cholesterol values and is compliant with medications prescribed. Participant is following exercise prescription and nutrition guidelines.;Long Term: Cholesterol controlled with medications as prescribed, with individualized exercise RX and with personalized nutrition plan. Value goals: LDL < 70mg , HDL > 40 mg.             Education:Diabetes - Individual verbal and written instruction to review signs/symptoms of  diabetes, desired ranges of glucose level fasting, after meals and with exercise. Acknowledge that pre and post exercise glucose checks will be done for 3 sessions at entry of program.   Core Components/Risk Factors/Patient Goals Review:   Goals and Risk Factor Review     Row Name 07/12/23 0742             Core Components/Risk Factors/Patient Goals Review   Personal Goals Review Weight Management/Obesity       Review Tilghman wants to lose a few pounds and reach a weight goal of 200 pounds. He  has lost weight since his heart event at 226 pounds. At home he has a bike and a treadmill.       Expected Outcomes Short: lose a few pounds in the next couple weeks. Long: reach weight goal.                Core Components/Risk Factors/Patient Goals at Discharge (Final Review):   Goals and Risk Factor Review - 07/12/23 0742       Core Components/Risk Factors/Patient Goals Review   Personal Goals Review Weight Management/Obesity    Review Ladell wants to lose a few pounds and reach a weight goal of 200 pounds. He has lost weight since his heart event at 226 pounds. At home he has a bike and a treadmill.    Expected Outcomes Short: lose a few pounds in the next couple weeks. Long: reach weight goal.             ITP Comments:  ITP Comments     Row Name 06/06/23 1346 06/10/23 1146 06/17/23 0932 07/03/23 0956 07/31/23 1304   ITP Comments Initial phone call completed. Diagnosis can be found in [facility] 10/30. EP Orientation scheduled for Monday 11/25 at 10:30am. Completed and gym orientation. Initial ITP created and sent for review to Dr. Firman Hughes, Medical Director. First full day of exercise!  Patient was oriented to gym and equipment including functions, settings, policies, and procedures.  Patient's individual exercise prescription and treatment plan were reviewed.  All starting workloads were established based on the results of the 6 minute walk test done at initial orientation visit.  The plan for exercise progression was also introduced and progression will be customized based on patient's performance and goals. 30 Day review completed. Medical Director ITP review done, changes made as directed, and signed approval by Medical Director.    new to program 30 Day review completed. Medical Director ITP review done, changes made as directed, and signed approval by Medical Director.            Comments:

## 2023-07-31 NOTE — Progress Notes (Signed)
 Daily Session Note  Patient Details  Name: Derrick Stone MRN: [id number] Date of Birth: Apr 15, 1951 Referring Provider:   Flowsheet Row Cardiac Rehab from 06/10/2023 in [facility] Cardiac and Pulmonary Rehab  Referring Provider Dr. Burney Carter       Encounter Date: 07/31/2023  Check In:  Session Check In - 07/31/23 0758       Check-In   Supervising physician immediately available to respond to emergencies See telemetry face sheet for immediately available ER MD    Location [facility]-Cardiac & Pulmonary Rehab    Staff Present Maud Sorenson, RN, BSN, CCRP;Maxon Conetta BS, Exercise Physiologist;Joseph Hood RCP,RRT,BSRT;Jason Martina Sledge RDN,LDN    Virtual Visit No    Medication changes reported     No    Fall or balance concerns reported    No    Warm-up and Cool-down Performed on first and last piece of equipment    Resistance Training Performed Yes    VAD Patient? No    PAD/SET Patient? No      Pain Assessment   Currently in Pain? No/denies                Social History   Tobacco Use  Smoking Status Never  Smokeless Tobacco Never    Goals Met:  Independence with exercise equipment Exercise tolerated well No report of concerns or symptoms today  Goals Unmet:  Not Applicable  Comments: Pt able to follow exercise prescription today without complaint.  Will continue to monitor for progression.    Dr. Firman Hughes is Medical Director for [facility] Cardiac Rehabilitation.  Dr. Fuad Aleskerov is Medical Director for [facility] Pulmonary Rehabilitation.

## 2023-08-02 ENCOUNTER — Encounter: Payer: Medicare Other | Admitting: *Deleted

## 2023-08-02 DIAGNOSIS — I252 Old myocardial infarction: Secondary | ICD-10-CM | POA: Diagnosis not present

## 2023-08-02 DIAGNOSIS — Z48812 Encounter for surgical aftercare following surgery on the circulatory system: Secondary | ICD-10-CM | POA: Diagnosis not present

## 2023-08-02 DIAGNOSIS — Z955 Presence of coronary angioplasty implant and graft: Secondary | ICD-10-CM | POA: Diagnosis not present

## 2023-08-02 DIAGNOSIS — I213 ST elevation (STEMI) myocardial infarction of unspecified site: Secondary | ICD-10-CM

## 2023-08-02 NOTE — Progress Notes (Signed)
Daily Session Note  Patient Details  Name: Derrick Stone MRN: [id number] Date of Birth: 12-03-1950 Referring Provider:   Flowsheet Row Cardiac Rehab from 06/10/2023 in [facility] Cardiac and Pulmonary Rehab  Referring Provider Dr. Dorothyann Peng       Encounter Date: 08/02/2023  Check In:  Session Check In - 08/02/23 0743       Check-In   Supervising physician immediately available to respond to emergencies See telemetry face sheet for immediately available ER MD    Location [facility]-Cardiac & Pulmonary Rehab    Staff Present Bess Kinds RN,BSN;Joseph [facility] [place], [place], Exercise Physiologist    Virtual Visit No    Medication changes reported     No    Fall or balance concerns reported    No    Tobacco Cessation No Change    Warm-up and Cool-down Performed on first and last piece of equipment    Resistance Training Performed Yes    VAD Patient? No    PAD/SET Patient? No      Pain Assessment   Currently in Pain? No/denies                Social History   Tobacco Use  Smoking Status Never  Smokeless Tobacco Never    Goals Met:  Independence with exercise equipment Exercise tolerated well No report of concerns or symptoms today Strength training completed today  Goals Unmet:  Not Applicable  Comments: Pt able to follow exercise prescription today without complaint.  Will continue to monitor for progression.    Dr. Bethann Punches is Medical Director for [facility] Cardiac Rehabilitation.  Dr. Vida Rigger is Medical Director for [facility] Pulmonary Rehabilitation.

## 2023-08-05 ENCOUNTER — Encounter: Payer: Medicare Other | Admitting: *Deleted

## 2023-08-05 DIAGNOSIS — I252 Old myocardial infarction: Secondary | ICD-10-CM | POA: Diagnosis not present

## 2023-08-05 DIAGNOSIS — I213 ST elevation (STEMI) myocardial infarction of unspecified site: Secondary | ICD-10-CM

## 2023-08-05 DIAGNOSIS — Z955 Presence of coronary angioplasty implant and graft: Secondary | ICD-10-CM | POA: Diagnosis not present

## 2023-08-05 DIAGNOSIS — Z48812 Encounter for surgical aftercare following surgery on the circulatory system: Secondary | ICD-10-CM | POA: Diagnosis not present

## 2023-08-05 NOTE — Progress Notes (Signed)
Daily Session Note  Patient Details  Name: Derrick Stone MRN: [id number] Date of Birth: September 25, 1950 Referring Provider:   Flowsheet Row Cardiac Rehab from 06/10/2023 in [facility] Cardiac and Pulmonary Rehab  Referring Provider Dr. Dorothyann Peng       Encounter Date: 08/05/2023  Check In:  Session Check In - 08/05/23 0723       Check-In   Supervising physician immediately available to respond to emergencies See telemetry face sheet for immediately available ER MD    Location [facility]-Cardiac & Pulmonary Rehab    Staff Present Susann Givens RN,BSN;Joseph [facility] Madilyn Fireman BS, ACSM CEP, Exercise Physiologist;Noah Tickle, BS, Exercise Physiologist    Virtual Visit No    Medication changes reported     No    Fall or balance concerns reported    No    Warm-up and Cool-down Performed on first and last piece of equipment    Resistance Training Performed Yes    VAD Patient? No    PAD/SET Patient? No      Pain Assessment   Currently in Pain? No/denies                Social History   Tobacco Use  Smoking Status Never  Smokeless Tobacco Never    Goals Met:  Independence with exercise equipment Exercise tolerated well No report of concerns or symptoms today Strength training completed today  Goals Unmet:  Not Applicable  Comments: Pt able to follow exercise prescription today without complaint.  Will continue to monitor for progression.    Dr. Bethann Punches is Medical Director for [facility] Cardiac Rehabilitation.  Dr. Vida Rigger is Medical Director for [facility] Pulmonary Rehabilitation.

## 2023-08-07 ENCOUNTER — Encounter: Payer: Medicare Other | Admitting: *Deleted

## 2023-08-07 DIAGNOSIS — Z955 Presence of coronary angioplasty implant and graft: Secondary | ICD-10-CM

## 2023-08-07 DIAGNOSIS — Z48812 Encounter for surgical aftercare following surgery on the circulatory system: Secondary | ICD-10-CM | POA: Diagnosis not present

## 2023-08-07 DIAGNOSIS — I252 Old myocardial infarction: Secondary | ICD-10-CM | POA: Diagnosis not present

## 2023-08-07 DIAGNOSIS — I213 ST elevation (STEMI) myocardial infarction of unspecified site: Secondary | ICD-10-CM

## 2023-08-07 NOTE — Progress Notes (Signed)
Daily Session Note  Patient Details  Name: STOCKTON PULLIAN MRN: [id number] Date of Birth: 04/29/1951 Referring Provider:   Flowsheet Row Cardiac Rehab from 06/10/2023 in [facility] Cardiac and Pulmonary Rehab  Referring Provider Dr. Dorothyann Peng       Encounter Date: 08/07/2023  Check In:  Session Check In - 08/07/23 0746       Check-In   Supervising physician immediately available to respond to emergencies See telemetry face sheet for immediately available ER MD    Location [facility]-Cardiac & Pulmonary Rehab    Staff Present Cora Collum, RN, BSN, CCRP;Noah Tickle, BS, Exercise Physiologist;Maxon Conetta BS, Exercise Physiologist    Virtual Visit No    Medication changes reported     No    Fall or balance concerns reported    No    Warm-up and Cool-down Performed on first and last piece of equipment    Resistance Training Performed Yes    VAD Patient? No      Pain Assessment   Currently in Pain? No/denies                Social History   Tobacco Use  Smoking Status Never  Smokeless Tobacco Never    Goals Met:  Independence with exercise equipment Exercise tolerated well No report of concerns or symptoms today  Goals Unmet:  Not Applicable  Comments: Pt able to follow exercise prescription today without complaint.  Will continue to monitor for progression.    Dr. Bethann Punches is Medical Director for [facility] Cardiac Rehabilitation.  Dr. Vida Rigger is Medical Director for [facility] Pulmonary Rehabilitation.

## 2023-08-09 ENCOUNTER — Encounter: Payer: Medicare Other | Admitting: *Deleted

## 2023-08-09 DIAGNOSIS — Z955 Presence of coronary angioplasty implant and graft: Secondary | ICD-10-CM | POA: Diagnosis not present

## 2023-08-09 DIAGNOSIS — I213 ST elevation (STEMI) myocardial infarction of unspecified site: Secondary | ICD-10-CM

## 2023-08-09 DIAGNOSIS — I252 Old myocardial infarction: Secondary | ICD-10-CM | POA: Diagnosis not present

## 2023-08-09 DIAGNOSIS — Z48812 Encounter for surgical aftercare following surgery on the circulatory system: Secondary | ICD-10-CM | POA: Diagnosis not present

## 2023-08-09 NOTE — Progress Notes (Signed)
Daily Session Note  Patient Details  Name: Derrick Stone MRN: [id number] Date of Birth: March 17, 1951 Referring Provider:   Flowsheet Row Cardiac Rehab from 06/10/2023 in [facility] Cardiac and Pulmonary Rehab  Referring Provider Dr. Dorothyann Peng       Encounter Date: 08/09/2023  Check In:  Session Check In - 08/09/23 0755       Check-In   Supervising physician immediately available to respond to emergencies See telemetry face sheet for immediately available ER MD    Location [facility]-Cardiac & Pulmonary Rehab    Staff Present Cora Collum, RN, BSN, CCRP;Noah Tickle, BS, Exercise Physiologist;Joseph Hood RCP,RRT,BSRT    Virtual Visit No    Medication changes reported     No    Fall or balance concerns reported    No    Warm-up and Cool-down Performed on first and last piece of equipment    Resistance Training Performed Yes    VAD Patient? No    PAD/SET Patient? No      Pain Assessment   Currently in Pain? No/denies                Social History   Tobacco Use  Smoking Status Never  Smokeless Tobacco Never    Goals Met:  Independence with exercise equipment Exercise tolerated well No report of concerns or symptoms today  Goals Unmet:  Not Applicable  Comments: Pt able to follow exercise prescription today without complaint.  Will continue to monitor for progression.    Dr. Bethann Punches is Medical Director for [facility] Cardiac Rehabilitation.  Dr. Vida Rigger is Medical Director for [facility] Pulmonary Rehabilitation.

## 2023-08-12 ENCOUNTER — Encounter: Payer: Medicare Other | Admitting: *Deleted

## 2023-08-12 VITALS — Ht 73.4 in | Wt 208.2 lb

## 2023-08-12 DIAGNOSIS — Z955 Presence of coronary angioplasty implant and graft: Secondary | ICD-10-CM | POA: Diagnosis not present

## 2023-08-12 DIAGNOSIS — Z48812 Encounter for surgical aftercare following surgery on the circulatory system: Secondary | ICD-10-CM | POA: Diagnosis not present

## 2023-08-12 DIAGNOSIS — I252 Old myocardial infarction: Secondary | ICD-10-CM | POA: Diagnosis not present

## 2023-08-12 DIAGNOSIS — I213 ST elevation (STEMI) myocardial infarction of unspecified site: Secondary | ICD-10-CM

## 2023-08-12 NOTE — Progress Notes (Signed)
Daily Session Note  Patient Details  Name: Derrick Stone MRN: [id number] Date of Birth: Mar 31, 1951 Referring Provider:   Flowsheet Row Cardiac Rehab from 06/10/2023 in [facility] Cardiac and Pulmonary Rehab  Referring Provider Dr. Dorothyann Peng       Encounter Date: 08/12/2023  Check In:  Session Check In - 08/12/23 0752       Check-In   Supervising physician immediately available to respond to emergencies See telemetry face sheet for immediately available ER MD    Location [facility]-Cardiac & Pulmonary Rehab    Staff Present Balinda Quails RDN,LDN;Joseph [facility] Madilyn Fireman BS, ACSM CEP, Exercise Physiologist;Adeleine Pask, RN, BSN, CCRP    Virtual Visit No    Medication changes reported     No    Fall or balance concerns reported    No    Warm-up and Cool-down Performed on first and last piece of equipment    Resistance Training Performed Yes    VAD Patient? No    PAD/SET Patient? No      Pain Assessment   Currently in Pain? No/denies              6 Minute Walk     Row Name 06/10/23 1146 08/12/23 0743       6 Minute Walk   Phase Initial Discharge    Distance 1470 feet 1690 feet    Distance % Change -- 15 %    Distance Feet Change -- 220 ft    Walk Time 6 minutes 6 minutes    # of Rest Breaks 0 0    MPH 2.8 3.2    METS 3.11 3.44    RPE 7 9    Perceived Dyspnea  0 0    VO2 Peak 10.89 12.05    Symptoms No No    Resting HR 84 bpm 65 bpm    Resting BP 116/66 110/70    Resting Oxygen Saturation  98 % 78 %    Exercise Oxygen Saturation  during 6 min walk 98 % 97 %    Max Ex. HR 98 bpm 99 bpm    Max Ex. BP 120/62 124/72    2 Minute Post BP 116/64 --                Social History   Tobacco Use  Smoking Status Never  Smokeless Tobacco Never    Goals Met:  Independence with exercise equipment Exercise tolerated well No report of concerns or symptoms today  Goals Unmet:  Not Applicable  Comments: Pt able to follow exercise prescription today  without complaint.  Will continue to monitor for progression.    Dr. Bethann Punches is Medical Director for [facility] Cardiac Rehabilitation.  Dr. Vida Rigger is Medical Director for [facility] Pulmonary Rehabilitation.

## 2023-08-12 NOTE — Patient Instructions (Signed)
Discharge Patient Instructions  Patient Details  Name: Derrick Stone MRN: [id number] Date of Birth: 20-Jul-1950 Referring Provider:  Malva Limes, MD   Number of Visits: 36  Reason for Discharge:  Patient reached a stable level of exercise. Patient independent in their exercise. Patient has met program and personal goals.  Initial Exercise Prescription:  Initial Exercise Prescription - 06/10/23 1100       Date of Initial Exercise RX and Referring Provider   Date 06/10/23    Referring Provider Dr. Dorothyann Peng      Oxygen   Maintain Oxygen Saturation 88% or higher      Treadmill   MPH 2.8    Grade 1    Minutes 15    METs 3.53      Elliptical   Level 1    Speed 1    Minutes 15    METs 3.11      REL-XR   Level 3    Speed 50    Minutes 15    METs 3.11      Prescription Details   Duration Progress to 30 minutes of continuous aerobic without signs/symptoms of physical distress      Intensity   THRR 40-80% of Max Heartrate 110-136    Ratings of Perceived Exertion 11-13    Perceived Dyspnea 0-4      Progression   Progression Continue to progress workloads to maintain intensity without signs/symptoms of physical distress.      Resistance Training   Training Prescription Yes    Weight 5 lb    Reps 10-15             Discharge Exercise Prescription (Final Exercise Prescription Changes):  Exercise Prescription Changes - 08/07/23 0700       Home Exercise Plan   Plans to continue exercise at Home (comment)   Treadmill, stationary bike, and chair exercises   Frequency Add 2 additional days to program exercise sessions.    Initial Home Exercises Provided 08/07/23      Oxygen   Maintain Oxygen Saturation 88% or higher             Functional Capacity:  6 Minute Walk     Row Name 06/10/23 1146 08/12/23 0743       6 Minute Walk   Phase Initial Discharge    Distance 1470 feet 1690 feet    Distance % Change -- 15 %    Distance Feet Change  -- 220 ft    Walk Time 6 minutes 6 minutes    # of Rest Breaks 0 0    MPH 2.8 3.2    METS 3.11 3.44    RPE 7 9    Perceived Dyspnea  0 0    VO2 Peak 10.89 12.05    Symptoms No No    Resting HR 84 bpm 65 bpm    Resting BP 116/66 110/70    Resting Oxygen Saturation  98 % 78 %    Exercise Oxygen Saturation  during 6 min walk 98 % 97 %    Max Ex. HR 98 bpm 99 bpm    Max Ex. BP 120/62 124/72    2 Minute Post BP 116/64 --            Nutrition & Weight - Outcomes:  Pre Biometrics - 06/10/23 1154       Pre Biometrics   Height 6' 1.4" (1.864 m)    Weight 219 lb 6.4 oz (99.5  kg)    Waist Circumference 42 inches    Hip Circumference 41 inches    Waist to Hip Ratio 1.02 %    BMI (Calculated) 28.64    Single Leg Stand 30 seconds             Post Biometrics - 08/12/23 0744        Post  Biometrics   Height 6' 1.4" (1.864 m)    Weight 208 lb 3.2 oz (94.4 kg)    Waist Circumference 41 inches    Hip Circumference 42 inches    Waist to Hip Ratio 0.98 %    BMI (Calculated) 27.18    Single Leg Stand 6.75 seconds             Nutrition:  Nutrition Therapy & Goals - 06/10/23 1329       Nutrition Therapy   Diet carb controlled, cardiac, low Na    Protein (specify units) 90    Fiber 30 grams    Whole Grain Foods 3 servings    Saturated Fats 15 max. grams    Fruits and Vegetables 5 servings/day    Sodium 2 grams      Personal Nutrition Goals   Nutrition Goal Eat 15-30gProtein and 30-60gCarbs at each meal.    Personal Goal #2 Drink 64oz of water, cut out sugary beverages    Personal Goal #3 Reduce saturated fat, less than 12g per day. Replace bad fats for more heart healthy fats.    Comments Patient drinking adequate water. ~64oz per day. He was drinking sprite and orange juice as well. After speaking with him he will quit drinking sugary beverages in favor of plain water. Educated him on types of carbs and quantity control with recommended portions of ~30-60g each  meal. He reports sometimes he is not very hungry. Explained the importance of protein carb pairing. Brainstormed some smaller easy to make meals and snacks for him to try. Reviewed Mediterranean diet handout, educated on types of fats, sources, and how to read facts label. Built out several meals and snacks with foods he likes and will eat. Went over several of his preferred foods and recommended changes he could try to implement to improve his heart health.      Intervention Plan   Intervention Prescribe, educate and counsel regarding individualized specific dietary modifications aiming towards targeted core components such as weight, hypertension, lipid management, diabetes, heart failure and other comorbidities.;Nutrition handout(s) given to patient.    Expected Outcomes Short Term Goal: Understand basic principles of dietary content, such as calories, fat, sodium, cholesterol and nutrients.;Short Term Goal: A plan has been developed with personal nutrition goals set during dietitian appointment.;Long Term Goal: Adherence to prescribed nutrition plan.             Goals reviewed with patient; copy given to patient.

## 2023-08-14 DIAGNOSIS — Z955 Presence of coronary angioplasty implant and graft: Secondary | ICD-10-CM

## 2023-08-14 DIAGNOSIS — I252 Old myocardial infarction: Secondary | ICD-10-CM | POA: Diagnosis not present

## 2023-08-14 DIAGNOSIS — I213 ST elevation (STEMI) myocardial infarction of unspecified site: Secondary | ICD-10-CM

## 2023-08-14 DIAGNOSIS — Z48812 Encounter for surgical aftercare following surgery on the circulatory system: Secondary | ICD-10-CM | POA: Diagnosis not present

## 2023-08-14 NOTE — Progress Notes (Signed)
Daily Session Note  Patient Details  Name: Derrick Stone MRN: [id number] Date of Birth: 1951-03-17 Referring Provider:   Flowsheet Row Cardiac Rehab from 06/10/2023 in [facility] Cardiac and Pulmonary Rehab  Referring Provider Dr. Dorothyann Peng       Encounter Date: 08/14/2023  Check In:  Session Check In - 08/14/23 0733       Check-In   Supervising physician immediately available to respond to emergencies See telemetry face sheet for immediately available ER MD    Location [facility]-Cardiac & Pulmonary Rehab    Staff Present Kelton Pillar RN,BSN,MPA;Margaret Best, MS, Exercise Physiologist;Maxon Conetta BS, Exercise Physiologist;Joseph Reino Kent RCP,RRT,BSRT    Virtual Visit No    Medication changes reported     No    Fall or balance concerns reported    No    Tobacco Cessation No Change    Warm-up and Cool-down Performed on first and last piece of equipment    Resistance Training Performed Yes    VAD Patient? No    PAD/SET Patient? No      Pain Assessment   Currently in Pain? No/denies                Social History   Tobacco Use  Smoking Status Never  Smokeless Tobacco Never    Goals Met:  Independence with exercise equipment Exercise tolerated well No report of concerns or symptoms today Strength training completed today  Goals Unmet:  Not Applicable  Comments: Pt able to follow exercise prescription today without complaint.  Will continue to monitor for progression.    Dr. Bethann Punches is Medical Director for [facility] Cardiac Rehabilitation.  Dr. Vida Rigger is Medical Director for [facility] Pulmonary Rehabilitation.

## 2023-08-16 ENCOUNTER — Encounter: Payer: Medicare Other | Admitting: *Deleted

## 2023-08-16 DIAGNOSIS — Z955 Presence of coronary angioplasty implant and graft: Secondary | ICD-10-CM | POA: Diagnosis not present

## 2023-08-16 DIAGNOSIS — Z48812 Encounter for surgical aftercare following surgery on the circulatory system: Secondary | ICD-10-CM | POA: Diagnosis not present

## 2023-08-16 DIAGNOSIS — I252 Old myocardial infarction: Secondary | ICD-10-CM | POA: Diagnosis not present

## 2023-08-16 DIAGNOSIS — I213 ST elevation (STEMI) myocardial infarction of unspecified site: Secondary | ICD-10-CM

## 2023-08-16 NOTE — Progress Notes (Signed)
Daily Session Note  Patient Details  Name: Derrick Stone MRN: [id number] Date of Birth: 10-May-1951 Referring Provider:   Flowsheet Row Cardiac Rehab from 06/10/2023 in [facility] Cardiac and Pulmonary Rehab  Referring Provider Dr. Dorothyann Peng       Encounter Date: 08/16/2023  Check In:  Session Check In - 08/16/23 0743       Check-In   Supervising physician immediately available to respond to emergencies See telemetry face sheet for immediately available ER MD    Location [facility]-Cardiac & Pulmonary Rehab    Staff Present Cora Collum, RN, BSN, CCRP;Joseph Hood RCP,RRT,BSRT;Noah Tickle, [place], Exercise Physiologist    Virtual Visit No    Medication changes reported     No    Fall or balance concerns reported    No    Warm-up and Cool-down Performed on first and last piece of equipment    Resistance Training Performed Yes    VAD Patient? No    PAD/SET Patient? No      Pain Assessment   Currently in Pain? No/denies                Social History   Tobacco Use  Smoking Status Never  Smokeless Tobacco Never    Goals Met:  Independence with exercise equipment Exercise tolerated well No report of concerns or symptoms today  Goals Unmet:  Not Applicable  Comments: Pt able to follow exercise prescription today without complaint.  Will continue to monitor for progression.    Dr. Bethann Punches is Medical Director for [facility] Cardiac Rehabilitation.  Dr. Vida Rigger is Medical Director for [facility] Pulmonary Rehabilitation.

## 2023-08-19 ENCOUNTER — Encounter: Payer: Medicare Other | Attending: Internal Medicine

## 2023-08-19 DIAGNOSIS — I213 ST elevation (STEMI) myocardial infarction of unspecified site: Secondary | ICD-10-CM | POA: Insufficient documentation

## 2023-08-19 DIAGNOSIS — Z955 Presence of coronary angioplasty implant and graft: Secondary | ICD-10-CM | POA: Diagnosis not present

## 2023-08-19 NOTE — Progress Notes (Signed)
Daily Session Note  Patient Details  Name: Derrick Stone MRN: [id number] Date of Birth: Jun 25, 1951 Referring Provider:   Flowsheet Row Cardiac Rehab from 06/10/2023 in [facility] Cardiac and Pulmonary Rehab  Referring Provider Dr. Dorothyann Peng       Encounter Date: 08/19/2023  Check In:  Session Check In - 08/19/23 0730       Check-In   Supervising physician immediately available to respond to emergencies See telemetry face sheet for immediately available ER MD    Location [facility]-Cardiac & Pulmonary Rehab    Staff Present Kelton Pillar [facility] Madilyn Fireman BS, ACSM CEP, Exercise Physiologist;Joseph Reino Kent RCP,RRT,BSRT;Jason Wallace Cullens RDN,LDN    Virtual Visit No    Medication changes reported     No    Fall or balance concerns reported    No    Tobacco Cessation No Change    Warm-up and Cool-down Performed on first and last piece of equipment    Resistance Training Performed Yes    VAD Patient? No    PAD/SET Patient? No      Pain Assessment   Currently in Pain? No/denies                Social History   Tobacco Use  Smoking Status Never  Smokeless Tobacco Never    Goals Met:  Independence with exercise equipment Exercise tolerated well No report of concerns or symptoms today Strength training completed today  Goals Unmet:  Not Applicable  Comments: Pt able to follow exercise prescription today without complaint.  Will continue to monitor for progression.    Dr. Bethann Punches is Medical Director for [facility] Cardiac Rehabilitation.  Dr. Vida Rigger is Medical Director for [facility] Pulmonary Rehabilitation.

## 2023-08-21 ENCOUNTER — Encounter: Payer: Medicare Other | Admitting: *Deleted

## 2023-08-21 ENCOUNTER — Other Ambulatory Visit: Payer: Self-pay | Admitting: Family Medicine

## 2023-08-21 DIAGNOSIS — I213 ST elevation (STEMI) myocardial infarction of unspecified site: Secondary | ICD-10-CM

## 2023-08-21 DIAGNOSIS — Z955 Presence of coronary angioplasty implant and graft: Secondary | ICD-10-CM | POA: Diagnosis not present

## 2023-08-21 DIAGNOSIS — E7800 Pure hypercholesterolemia, unspecified: Secondary | ICD-10-CM

## 2023-08-21 NOTE — Progress Notes (Signed)
 Daily Session Note  Patient Details  Name: Derrick Stone MRN: [id number] Date of Birth: 1950/08/22 Referring Provider:   Flowsheet Row Cardiac Rehab from 06/10/2023 in [facility] Cardiac and Pulmonary Rehab  Referring Provider Dr. Cara Lovelace       Encounter Date: 08/21/2023  Check In:  Session Check In - 08/21/23 0749       Check-In   Supervising physician immediately available to respond to emergencies See telemetry face sheet for immediately available ER MD    Location [facility]-Cardiac & Pulmonary Rehab    Staff Present Othel Durand, RN, BSN, CCRP;Maxon Conetta BS, Exercise Physiologist;Joseph Hood RCP,RRT,BSRT    Virtual Visit No    Medication changes reported     No    Fall or balance concerns reported    No    Warm-up and Cool-down Performed on first and last piece of equipment    Resistance Training Performed Yes    VAD Patient? No    PAD/SET Patient? No      Pain Assessment   Currently in Pain? No/denies                Social History   Tobacco Use  Smoking Status Never  Smokeless Tobacco Never    Goals Met:  Independence with exercise equipment Exercise tolerated well No report of concerns or symptoms today  Goals Unmet:  Not Applicable  Comments: Pt able to follow exercise prescription today without complaint.  Will continue to monitor for progression.    Dr. Oneil Pinal is Medical Director for [facility] Cardiac Rehabilitation.  Dr. Fuad Aleskerov is Medical Director for [facility] Pulmonary Rehabilitation.

## 2023-08-21 NOTE — Telephone Encounter (Signed)
 Medication Refill -  Most Recent Primary Care Visit:  Provider: GASPER NANCYANN BRAVO  Department: [id number]  Visit Type: PHYSICAL  Date: 01/30/2023  Medication: ezetimibe  (ZETIA ) 10 MG tablet   Pt has misplaced his current supply, is requesting a refill. He picked up his recent supply within the last 2 weeks but is completely out.   Has the patient contacted their pharmacy? Yes (Agent: If no, request that the patient contact the pharmacy for the refill. If patient does not wish to contact the pharmacy document the reason why and proceed with request.) (Agent: If yes, when and what did the pharmacy advise?)  Is this the correct pharmacy for this prescription? Yes If no, delete pharmacy and type the correct one.  This is the patient's preferred pharmacy:   [id number] [place], [place] - [address] [place] [address] Phone: [phone] Fax: [phone]  Has the prescription been filled recently? Yes  Is the patient out of the medication? Yes  Has the patient been seen for an appointment in the last year OR does the patient have an upcoming appointment? Yes  Can we respond through MyChart? Yes  Agent: Please be advised that Rx refills may take up to 3 business days. We ask that you follow-up with your pharmacy.

## 2023-08-22 NOTE — Telephone Encounter (Signed)
 Pt should have refills available. Need to call pharmacy.

## 2023-08-22 NOTE — Telephone Encounter (Signed)
 Call to pharmacy- they are going to do vacation override which will give patient #30 covered by insurance. Pharmacist said he may have to pay for the next 30 out of pocket- or they can try another override at that time. Patient has been notified- grateful for the assistance.  Requested Prescriptions  Pending Prescriptions Disp Refills   ezetimibe  (ZETIA ) 10 MG tablet 90 tablet 3    Sig: Take 1 tablet (10 mg total) by mouth daily.     Cardiovascular:  Antilipid - Sterol Transport Inhibitors Failed - 08/22/2023  3:49 PM      Failed - Lipid Panel in normal range within the last 12 months    Cholesterol, Total  Date Value Ref Range Status  01/30/2023 151 100 - 199 mg/dL Final   LDL Chol Calc (NIH)  Date Value Ref Range Status  01/30/2023 87 0 - 99 mg/dL Final   HDL  Date Value Ref Range Status  01/30/2023 45 >39 mg/dL Final   Triglycerides  Date Value Ref Range Status  01/30/2023 105 0 - 149 mg/dL Final         Passed - AST in normal range and within 360 days    AST  Date Value Ref Range Status  01/30/2023 22 0 - 40 IU/L Final         Passed - ALT in normal range and within 360 days    ALT  Date Value Ref Range Status  01/30/2023 18 0 - 44 IU/L Final         Passed - Patient is not pregnant      Passed - Valid encounter within last 12 months    Recent Outpatient Visits           6 months ago Atherosclerosis of native coronary artery of native heart without angina pectoris   [facility] Gasper Nancyann BRAVO, MD   1 year ago Medicare annual wellness visit, subsequent   [facility] Gasper Nancyann BRAVO, MD   2 years ago Prescription for Tdap. Vaccine not administered in office.    [facility] Gasper Nancyann BRAVO, MD   2 years ago Atherosclerosis of native coronary artery of native heart without angina pectoris   [facility] Gasper Nancyann BRAVO, MD   2 years ago Chest tightness    [facility] Bertrum Charlie LITTIE Mickey., MD       Future Appointments             In 5 months Fisher, Nancyann BRAVO, MD [facility]   In 9 months Hester Alm BROCKS, MD [facility]

## 2023-08-23 ENCOUNTER — Encounter: Payer: Medicare Other | Admitting: *Deleted

## 2023-08-23 DIAGNOSIS — Z955 Presence of coronary angioplasty implant and graft: Secondary | ICD-10-CM

## 2023-08-23 DIAGNOSIS — I213 ST elevation (STEMI) myocardial infarction of unspecified site: Secondary | ICD-10-CM

## 2023-08-23 NOTE — Progress Notes (Signed)
 Discharge Note for  Derrick Stone     1951-03-23        Derrick Stone graduated today from  rehab with 36 sessions completed.  Details of the patient's exercise prescription and what He needs to do in order to continue the prescription and progress were discussed with patient.  Patient was given a copy of prescription and goals.  Patient verbalized understanding. Derrick Stone plans to continue to exercise by using equipment he has at home.    6 Minute Walk     Row Name 06/10/23 1146 08/12/23 0743       6 Minute Walk   Phase Initial Discharge    Distance 1470 feet 1690 feet    Distance % Change -- 15 %    Distance Feet Change -- 220 ft    Walk Time 6 minutes 6 minutes    # of Rest Breaks 0 0    MPH 2.8 3.2    METS 3.11 3.44    RPE 7 9    Perceived Dyspnea  0 0    VO2 Peak 10.89 12.05    Symptoms No No    Resting HR 84 bpm 65 bpm    Resting BP 116/66 110/70    Resting Oxygen Saturation  98 % 78 %    Exercise Oxygen Saturation  during 6 min walk 98 % 97 %    Max Ex. HR 98 bpm 99 bpm    Max Ex. BP 120/62 124/72    2 Minute Post BP 116/64 --

## 2023-08-23 NOTE — Progress Notes (Signed)
 Cardiac Individual Treatment Plan  Patient Details  Name: Derrick Stone MRN: [id number] Date of Birth: [birth year] Referring Provider:   Flowsheet Row Cardiac Rehab from 06/10/2023 in [facility] Cardiac and Pulmonary Rehab  Referring Provider Dr. Cara Lovelace       Initial Encounter Date:  Flowsheet Row Cardiac Rehab from 06/10/2023 in [facility] Cardiac and Pulmonary Rehab  Date 06/10/23       Visit Diagnosis: ST elevation myocardial infarction (STEMI), unspecified artery [facility])  Status post coronary artery stent placement  Patient's Home Medications on Admission:  Current Outpatient Medications:    aspirin  EC 81 MG tablet, Take 81 mg by mouth daily., Disp: , Rfl:    atorvastatin  (LIPITOR ) 80 MG tablet, Take 1 tablet (80 mg total) by mouth daily., Disp: 90 tablet, Rfl: 1   clopidogrel  (PLAVIX ) 75 MG tablet, Take 1 tablet (75 mg total) by mouth daily., Disp: 30 tablet, Rfl: 0   escitalopram  (LEXAPRO ) 10 MG tablet, TAKE 1 TABLET (10 MG TOTAL) BY MOUTH ONCE DAILY., Disp: , Rfl: 1   ezetimibe  (ZETIA ) 10 MG tablet, Take 1 tablet (10 mg total) by mouth daily., Disp: 90 tablet, Rfl: 3   losartan  (COZAAR ) 25 MG tablet, Take 1 tablet (25 mg total) by mouth daily. (Patient taking differently: Take 12.5 mg by mouth daily.), Disp: 30 tablet, Rfl: 1   metoprolol  succinate (TOPROL -XL) 25 MG 24 hr tablet, Take 1 tablet by mouth daily., Disp: , Rfl:    mupirocin  ointment (BACTROBAN ) 2 %, Apply daily with bandage change, Disp: 22 g, Rfl: 1   Omega-3 Fatty Acids (FISH OIL PO), Take 1 capsule by mouth daily., Disp: , Rfl:    omeprazole  (PRILOSEC  OTC) 20 MG tablet, Take 1 tablet by mouth daily., Disp: , Rfl:   Past Medical History: Past Medical History:  Diagnosis Date   CAD (coronary artery disease)    History of chickenpox    History of mumps    Hyperlipidemia    Hypertension    NSTEMI (non-ST elevated myocardial infarction) (HCC) 09/19/2020    Tobacco Use: Social History   Tobacco Use  Smoking  Status Never  Smokeless Tobacco Never    Labs: Review Flowsheet  More data exists      Latest Ref Rng & Units 09/20/2020 09/21/2020 10/25/2020 12/06/2021 01/30/2023  Labs for ITP Cardiac and Pulmonary Rehab  Cholestrol 100 - 199 mg/dL 855  - 871  845  848   LDL (calc) 0 - 99 mg/dL 90  - 72  92  87   HDL-C >39 mg/dL 36  - 33  40  45   Trlycerides 0 - 149 mg/dL 92  - 872  876  894   Hemoglobin A1c 4.8 - 5.6 % - 6.1  - 6.1  6.0      Exercise Target Goals: Exercise Program Goal: Individual exercise prescription set using results from initial 6 min walk test and THRR while considering  patient's activity barriers and safety.   Exercise Prescription Goal: Initial exercise prescription builds to 30-45 minutes a day of aerobic activity, 2-3 days per week.  Home exercise guidelines will be given to patient during program as part of exercise prescription that the participant will acknowledge.   Education: Aerobic Exercise: - Group verbal and visual presentation on the components of exercise prescription. Introduces F.I.T.T principle from ACSM for exercise prescriptions.  Reviews F.I.T.T. principles of aerobic exercise including progression. Written material given at graduation. Flowsheet Row Cardiac Rehab from 08/21/2023 in [facility] Cardiac and Pulmonary  Rehab  Education need identified 06/17/23  Date 07/24/23  Educator NT  Instruction Review Code 1- Verbalizes Understanding       Education: Resistance Exercise: - Group verbal and visual presentation on the components of exercise prescription. Introduces F.I.T.T principle from ACSM for exercise prescriptions  Reviews F.I.T.T. principles of resistance exercise including progression. Written material given at graduation. Flowsheet Row Cardiac Rehab from 08/21/2023 in [facility] Cardiac and Pulmonary Rehab  Date 07/15/23  Educator [place]  Instruction Review Code 1- Verbalizes Understanding        Education: Exercise & Equipment Safety: - Individual verbal  instruction and demonstration of equipment use and safety with use of the equipment. Flowsheet Row Cardiac Rehab from 08/21/2023 in [facility] Cardiac and Pulmonary Rehab  Date 06/17/23  Educator [facility]  Instruction Review Code 1- Verbalizes Understanding       Education: Exercise Physiology & General Exercise Guidelines: - Group verbal and written instruction with models to review the exercise physiology of the cardiovascular system and associated critical values. Provides general exercise guidelines with specific guidelines to those with heart or lung disease.  Flowsheet Row Cardiac Rehab from 08/21/2023 in [facility] Cardiac and Pulmonary Rehab  Date 06/17/23  Educator [facility]  Instruction Review Code 1- [employer] Understanding       Education: Flexibility, Balance, Mind/Body Relaxation: - Group verbal and visual presentation with interactive activity on the components of exercise prescription. Introduces F.I.T.T principle from ACSM for exercise prescriptions. Reviews F.I.T.T. principles of flexibility and balance exercise training including progression. Also discusses the mind body connection.  Reviews various relaxation techniques to help reduce and manage stress (i.e. Deep breathing, progressive muscle relaxation, and visualization). Balance handout provided to take home. Written material given at graduation. Flowsheet Row Cardiac Rehab from 08/21/2023 in [facility] Cardiac and Pulmonary Rehab  Date 07/15/23  Educator [place]  Instruction Review Code 1- Verbalizes Understanding       Activity Barriers & Risk Stratification:  Activity Barriers & Cardiac Risk Stratification - 06/10/23 1149       Activity Barriers & Cardiac Risk Stratification   Activity Barriers Other (comment)    Comments 10 years ago- fell off ladder- sometimes left foot gets aggravated    Cardiac Risk Stratification High             6 Minute Walk:  6 Minute Walk     Row Name 06/10/23 1146 08/12/23 0743       6 Minute Walk    Phase Initial Discharge    Distance 1470 feet 1690 feet    Distance % Change -- 15 %    Distance Feet Change -- 220 ft    Walk Time 6 minutes 6 minutes    # of Rest Breaks 0 0    MPH 2.8 3.2    METS 3.11 3.44    RPE 7 9    Perceived Dyspnea  0 0    VO2 Peak 10.89 12.05    Symptoms No No    Resting HR 84 bpm 65 bpm    Resting BP 116/66 110/70    Resting Oxygen Saturation  98 % 78 %    Exercise Oxygen Saturation  during 6 min walk 98 % 97 %    Max Ex. HR 98 bpm 99 bpm    Max Ex. BP 120/62 124/72    2 Minute Post BP 116/64 --             Oxygen Initial Assessment:   Oxygen Re-Evaluation:   Oxygen  Discharge (Final Oxygen Re-Evaluation):   Initial Exercise Prescription:  Initial Exercise Prescription - 06/10/23 1100       Date of Initial Exercise RX and Referring Provider   Date 06/10/23    Referring Provider Dr. Cara Lovelace      Oxygen   Maintain Oxygen Saturation 88% or higher      Treadmill   MPH 2.8    Grade 1    Minutes 15    METs 3.53      Elliptical   Level 1    Speed 1    Minutes 15    METs 3.11      REL-XR   Level 3    Speed 50    Minutes 15    METs 3.11      Prescription Details   Duration Progress to 30 minutes of continuous aerobic without signs/symptoms of physical distress      Intensity   THRR 40-80% of Max Heartrate 110-136    Ratings of Perceived Exertion 11-13    Perceived Dyspnea 0-4      Progression   Progression Continue to progress workloads to maintain intensity without signs/symptoms of physical distress.      Resistance Training   Training Prescription Yes    Weight 5 lb    Reps 10-15             Perform Capillary Blood Glucose checks as needed.  Exercise Prescription Changes:   Exercise Prescription Changes     Row Name 06/10/23 1100 06/25/23 1500 07/11/23 1600 07/25/23 0900 08/06/23 1000     Response to Exercise   Blood Pressure (Admit) 116/66 118/56 102/62 102/62 98/60   Blood Pressure  (Exercise) 120/62 132/58 132/66 -- --   Blood Pressure (Exit) 116/64 102/56 106/64 102/64 104/64   Heart Rate (Admit) 84 bpm 75 bpm 87 bpm 102 bpm 82 bpm   Heart Rate (Exercise) 98 bpm 130 bpm 140 bpm 131 bpm 149 bpm   Heart Rate (Exit) 86 bpm 89 bpm 87 bpm 97 bpm 88 bpm   Oxygen Saturation (Admit) 98 % -- -- -- --   Oxygen Saturation (Exercise) 98 % -- -- -- --   Oxygen Saturation (Exit) 98 % -- -- -- --   Rating of Perceived Exertion (Exercise) 7 15 14 13 14    Perceived Dyspnea (Exercise) 0 0 -- 0 0   Symptoms none none none none none   Comments results -- -- -- --   Duration -- Progress to 30 minutes of  aerobic without signs/symptoms of physical distress Continue with 30 min of aerobic exercise without signs/symptoms of physical distress. Continue with 30 min of aerobic exercise without signs/symptoms of physical distress. Continue with 30 min of aerobic exercise without signs/symptoms of physical distress.   Intensity -- THRR unchanged THRR unchanged THRR unchanged THRR unchanged     Progression   Progression -- Continue to progress workloads to maintain intensity without signs/symptoms of physical distress. Continue to progress workloads to maintain intensity without signs/symptoms of physical distress. Continue to progress workloads to maintain intensity without signs/symptoms of physical distress. Continue to progress workloads to maintain intensity without signs/symptoms of physical distress.   Average METs 3.11 3.8 4.77 5.53 5.46     Resistance Training   Training Prescription -- Yes Yes Yes Yes   Weight -- 5 lb 5 lb 5 lb 5 lb   Reps -- 10-15 10-15 10-15 10-15     Interval Training   Interval Training --  No No No No     Treadmill   MPH -- 3.2 3.1 3.3 3.4   Grade -- 3 6 6 6    Minutes -- 15 15 15 15    METs -- 4.77 5.94 6.25 6.41     Elliptical   Level -- 2 3 5 7    Speed -- 1 3 3.1 3.6   Minutes -- 15 15 15 15    METs -- 3.5 3.4 4.6 --     Oxygen   Maintain Oxygen  Saturation -- 88% or higher 88% or higher 88% or higher 88% or higher    Row Name 08/07/23 0700 08/19/23 1700           Response to Exercise   Blood Pressure (Admit) -- 102/60      Blood Pressure (Exercise) -- 124/72      Blood Pressure (Exit) -- 126/70      Heart Rate (Admit) -- 71 bpm      Heart Rate (Exercise) -- 144 bpm      Heart Rate (Exit) -- 77 bpm      Oxygen Saturation (Admit) -- 98 %      Oxygen Saturation (Exercise) -- 91 %      Oxygen Saturation (Exit) -- 97 %      Rating of Perceived Exertion (Exercise) -- 13      Perceived Dyspnea (Exercise) -- 0      Symptoms -- none      Duration -- Continue with 30 min of aerobic exercise without signs/symptoms of physical distress.      Intensity -- THRR unchanged        Progression   Progression -- Continue to progress workloads to maintain intensity without signs/symptoms of physical distress.      Average METs -- 5.75        Resistance Training   Training Prescription -- Yes      Weight -- 7 lb      Reps -- 10-15        Interval Training   Interval Training -- No        Treadmill   MPH -- 3.6      Grade -- 6.5      Minutes -- 15      METs -- 6.98        Elliptical   Level -- 6      Speed -- 3.6      Minutes -- 15        Home Exercise Plan   Plans to continue exercise at Home (comment)  Treadmill, stationary bike, and chair exercises Home (comment)  Treadmill, stationary bike, and chair exercises      Frequency Add 2 additional days to program exercise sessions. Add 2 additional days to program exercise sessions.      Initial Home Exercises Provided 08/07/23 08/07/23        Oxygen   Maintain Oxygen Saturation 88% or higher 88% or higher               Exercise Comments:   Exercise Comments     Row Name 06/17/23 0932 08/23/23 0754         Exercise Comments First full day of exercise!  Patient was oriented to gym and equipment including functions, settings, policies, and procedures.  Patient's  individual exercise prescription and treatment plan were reviewed.  All starting workloads were established based on the results of the 6 minute walk test done at initial orientation visit.  The plan for exercise progression was also introduced and progression will be customized based on patient's performance and goals. Soma graduated today from  rehab with 36 sessions completed.  Details of the patient's exercise prescription and what He needs to do in order to continue the prescription and progress were discussed with patient.  Patient was given a copy of prescription and goals.  Patient verbalized understanding. Tracie plans to continue to exercise by using equipment he has at home.               Exercise Goals and Review:   Exercise Goals     Row Name 06/10/23 1154             Exercise Goals   Increase Physical Activity Yes       Expected Outcomes Long Term: Exercising regularly at least 3-5 days a week.;Long Term: Add in home exercise to make exercise part of routine and to increase amount of physical activity.;Short Term: Attend rehab on a regular basis to increase amount of physical activity.       Increase Strength and Stamina Yes       Intervention Provide advice, education, support and counseling about physical activity/exercise needs.;Develop an individualized exercise prescription for aerobic and resistive training based on initial evaluation findings, risk stratification, comorbidities and participant's personal goals.       Expected Outcomes Long Term: Improve cardiorespiratory fitness, muscular endurance and strength as measured by increased METs and functional capacity ( );Short Term: Perform resistance training exercises routinely during rehab and add in resistance training at home;Short Term: Increase workloads from initial exercise prescription for resistance, speed, and METs.       Able to understand and use rate of perceived exertion (RPE) scale Yes       Intervention  Provide education and explanation on how to use RPE scale       Expected Outcomes Long Term:  Able to use RPE to guide intensity level when exercising independently;Short Term: Able to use RPE daily in rehab to express subjective intensity level       Able to understand and use Dyspnea scale Yes       Intervention Provide education and explanation on how to use Dyspnea scale       Expected Outcomes Long Term: Able to use Dyspnea scale to guide intensity level when exercising independently;Short Term: Able to use Dyspnea scale daily in rehab to express subjective sense of shortness of breath during exertion       Knowledge and understanding of Target Heart Rate Range (THRR) Yes       Intervention Provide education and explanation of THRR including how the numbers were predicted and where they are located for reference       Expected Outcomes Long Term: Able to use THRR to govern intensity when exercising independently;Short Term: Able to use daily as guideline for intensity in rehab;Short Term: Able to state/look up THRR       Able to check pulse independently Yes       Intervention Review the importance of being able to check your own pulse for safety during independent exercise;Provide education and demonstration on how to check pulse in carotid and radial arteries.       Expected Outcomes Long Term: Able to check pulse independently and accurately;Short Term: Able to explain why pulse checking is important during independent exercise       Understanding of Exercise Prescription Yes       Intervention Provide  education, explanation, and written materials on patient's individual exercise prescription       Expected Outcomes Long Term: Able to explain home exercise prescription to exercise independently;Short Term: Able to explain program exercise prescription                Exercise Goals Re-Evaluation :  Exercise Goals Re-Evaluation     Row Name 06/17/23 0933 06/25/23 1531 07/11/23 1618  07/25/23 0954 08/06/23 1036     Exercise Goal Re-Evaluation   Exercise Goals Review Able to understand and use rate of perceived exertion (RPE) scale;Able to understand and use Dyspnea scale;Knowledge and understanding of Target Heart Rate Range (THRR);Understanding of Exercise Prescription Increase Physical Activity;Increase Strength and Stamina;Understanding of Exercise Prescription Increase Physical Activity;Increase Strength and Stamina;Understanding of Exercise Prescription Increase Physical Activity;Increase Strength and Stamina;Understanding of Exercise Prescription Increase Physical Activity;Increase Strength and Stamina;Understanding of Exercise Prescription   Comments Reviewed RPE and dyspnea scale, THR and program prescription with pt today.  Pt voiced understanding and was given a copy of goals to take home. Varick is off to a good start in the program. He has already made increases to hisinitial  exercise prescription. He was able to increase his level on the elliptical from level 1 to 2. He was also able to increase his speed on the treadmill from 2.8 to 3.2 mph. We will continue to monitor his progress in the program. Yuvan is doing well in rehab. He recently increased his treadmill workload to a speed of 3.1 mph with an incline of 6%. He also improved to level 3 on the elliptical at a speed of 3 mph. We will continue to monitor his progress in the program. Ladon continues to do well in rehab. He was recently able to increase his speed on the treadmill from 3. to 3.3 mph while maintaining an incline of 6%. He was also able to increase from level 3 to 5 on the elliptical. We will continue to monitor his progress in the program. Deric is going well in rehab. He was able to increase his level on the elliptical signifigantly from level 5 to 7. He was also able to increase his speed on the treadmill from 3. to 3.4 mph while maintaining a grade of 6%. We will continue to monitor his progress in  the program.   Expected Outcomes Short: Use RPE daily to regulate intensity. Long: Follow program prescription in THR. Short: Continue to follow current exercise prescription. Long: Continue exercise to improve strength and stamina. Short: Continue to progressively increase treadmill workload. Long: Continue exercise to improve strength and stamina. Short: Continue to progressively increase treadmill and elliptical workload. Long: Continue exercise to improve strength and stamina. Short: Continue to progressively increase treadmill and elliptical workload. Long: Continue exercise to improve strength and stamina.    Row Name 08/07/23 9247 08/19/23 1704           Exercise Goal Re-Evaluation   Exercise Goals Review Increase Strength and Stamina;Understanding of Exercise Prescription;Able to understand and use Dyspnea scale;Increase Physical Activity;Knowledge and understanding of Target Heart Rate Range (THRR);Able to check pulse independently;Able to understand and use rate of perceived exertion (RPE) scale Increase Physical Activity;Understanding of Exercise Prescription;Increase Strength and Stamina      Comments Reviewed home exercise with pt today.  Pt plans to use his stationary bike, walk the treadmill and do chair exercises at home for exercise.  Reviewed THR, pulse, RPE, sign and symptoms, pulse oximetery and when to call 911 or MD.  Also discussed weather considerations and indoor options.  Pt voiced understanding. Dashiell continues to do well in rehab. He completed his post and improved by 15%. He also improved his workload on the treadmill with a speed of 3. and incline of 6.5%. He did level 6 on the elliptical with a speed of 3.27mph. He also increased his handweights to 7lbs. We will continue to monitor his progress in the program.      Expected Outcomes Short: Continue to exercise at home on days away from rehab. Long: Continue exercise to improve strength and stamina. Short: Graduate.  Long: Continue to exercise independently.               Discharge Exercise Prescription (Final Exercise Prescription Changes):  Exercise Prescription Changes - 08/19/23 1700       Response to Exercise   Blood Pressure (Admit) 102/60    Blood Pressure (Exercise) 124/72    Blood Pressure (Exit) 126/70    Heart Rate (Admit) 71 bpm    Heart Rate (Exercise) 144 bpm    Heart Rate (Exit) 77 bpm    Oxygen Saturation (Admit) 98 %    Oxygen Saturation (Exercise) 91 %    Oxygen Saturation (Exit) 97 %    Rating of Perceived Exertion (Exercise) 13    Perceived Dyspnea (Exercise) 0    Symptoms none    Duration Continue with 30 min of aerobic exercise without signs/symptoms of physical distress.    Intensity THRR unchanged      Progression   Progression Continue to progress workloads to maintain intensity without signs/symptoms of physical distress.    Average METs 5.75      Resistance Training   Training Prescription Yes    Weight 7 lb    Reps 10-15      Interval Training   Interval Training No      Treadmill   MPH 3.6    Grade 6.5    Minutes 15    METs 6.98      Elliptical   Level 6    Speed 3.6    Minutes 15      Home Exercise Plan   Plans to continue exercise at Home (comment)   Treadmill, stationary bike, and chair exercises   Frequency Add 2 additional days to program exercise sessions.    Initial Home Exercises Provided 08/07/23      Oxygen   Maintain Oxygen Saturation 88% or higher             Nutrition:  Target Goals: Understanding of nutrition guidelines, daily intake of sodium 1500mg , cholesterol 200mg , calories 30% from fat and 7% or less from saturated fats, daily to have 5 or more servings of fruits and vegetables.  Education: All About Nutrition: -Group instruction provided by verbal, written material, interactive activities, discussions, models, and posters to present general guidelines for heart healthy nutrition including fat, fiber, MyPlate,  the role of sodium in heart healthy nutrition, utilization of the nutrition label, and utilization of this knowledge for meal planning. Follow up email sent as well. Written material given at graduation. Flowsheet Row Cardiac Rehab from 08/21/2023 in [facility] Cardiac and Pulmonary Rehab  Education need identified 06/17/23  Date 07/31/23  Educator JG  Instruction Review Code 1- Verbalizes Understanding       Biometrics:  Pre Biometrics - 06/10/23 1154       Pre Biometrics   Height 6' 1.4 (1.864 m)    Weight 219 lb 6.4 oz (99.5 kg)  Waist Circumference 42 inches    Hip Circumference 41 inches    Waist to Hip Ratio 1.02 %    BMI (Calculated) 28.64    Single Leg Stand 30 seconds             Post Biometrics - 08/12/23 0744        Post  Biometrics   Height 6' 1.4 (1.864 m)    Weight 208 lb 3.2 oz (94.4 kg)    Waist Circumference 41 inches    Hip Circumference 42 inches    Waist to Hip Ratio 0.98 %    BMI (Calculated) 27.18    Single Leg Stand 6.75 seconds             Nutrition Therapy Plan and Nutrition Goals:  Nutrition Therapy & Goals - 06/10/23 1329       Nutrition Therapy   Diet carb controlled, cardiac, low Na    Protein (specify units) 90    Fiber 30 grams    Whole Grain Foods 3 servings    Saturated Fats 15 max. grams    Fruits and Vegetables 5 servings/day    Sodium 2 grams      Personal Nutrition Goals   Nutrition Goal Eat 15-30gProtein and 30-60gCarbs at each meal.    Personal Goal #2 Drink 64oz of water, cut out sugary beverages    Personal Goal #3 Reduce saturated fat, less than 12g per day. Replace bad fats for more heart healthy fats.    Comments Patient drinking adequate water. ~64oz per day. He was drinking sprite and orange juice as well. After speaking with him he will quit drinking sugary beverages in favor of plain water. Educated him on types of carbs and quantity control with recommended portions of ~30-60g each meal. He reports sometimes  he is not very hungry. Explained the importance of protein carb pairing. Brainstormed some smaller easy to make meals and snacks for him to try. Reviewed Mediterranean diet handout, educated on types of fats, sources, and how to read facts label. Built out several meals and snacks with foods he likes and will eat. Went over several of his preferred foods and recommended changes he could try to implement to improve his heart health.      Intervention Plan   Intervention Prescribe, educate and counsel regarding individualized specific dietary modifications aiming towards targeted core components such as weight, hypertension, lipid management, diabetes, heart failure and other comorbidities.;Nutrition handout(s) given to patient.    Expected Outcomes Short Term Goal: Understand basic principles of dietary content, such as calories, fat, sodium, cholesterol and nutrients.;Short Term Goal: A plan has been developed with personal nutrition goals set during dietitian appointment.;Long Term Goal: Adherence to prescribed nutrition plan.             Nutrition Assessments:  MEDIFICTS Score Key: >=70 Need to make dietary changes  40-70 Heart Healthy Diet <= 40 Therapeutic Level Cholesterol Diet  Flowsheet Row Cardiac Rehab from 08/14/2023 in [facility] Cardiac and Pulmonary Rehab  Picture Your Plate Total Score on Admission 65  Picture Your Plate Total Score on Discharge 63      Picture Your Plate Scores: <59 Unhealthy dietary pattern with much room for improvement. 41-50 Dietary pattern unlikely to meet recommendations for good health and room for improvement. 51-60 More healthful dietary pattern, with some room for improvement.  >60 Healthy dietary pattern, although there may be some specific behaviors that could be improved.    Nutrition Goals Re-Evaluation:  Nutrition  Goals Re-Evaluation     Row Name 07/12/23 0748 08/07/23 0803 08/12/23 0742         Goals   Current Weight 212 lb (96.2 kg)  209 lb (94.8 kg) --     Comment Tadan eats nothing fried and has limited his bread intake. He loves sweets and has cut back dramatically. Anoop is working towards losing some weight. Jasher is still working on the nutrition guidelines he discussed with the RD. He is not eating any fried foods at this time, and his main protein is grilled chicken. He also has been eating sugar free ice cream and candy when he wants sweet. He has also switched to 2% milk and cut out potato chips from his diet. He also reports drinking 4-5 bottles of water a day. He is still doing well with nutriton working on making changes discussed in RD appointments and nutrition classes. He has switched to sugar free soda, cut back on how much soda he has been drinking, while still drinking adequate water, ~64-80oz. He is still choosing leaner meats and portioning them at around the 3-4oz recommendation. Has a good support system at home, his wife also reads labels and together they try to limit sodium and saturated fat.     Expected Outcome Short: adhere to a diet plan where he can lose a few pounds. Long: maintain a diet that pertains to him independently. Short: Adhere to a diet plan where he can lose a few pounds. Long: Continue heart healthy dietary patterns. STG: continue to drink adequate water, cut back on sugary beverages and read labels to limit sodium and saturated fat/ LTG: maintain a heart healthy lifestyle              Nutrition Goals Discharge (Final Nutrition Goals Re-Evaluation):  Nutrition Goals Re-Evaluation - 08/12/23 0742       Goals   Comment He is still doing well with nutriton working on making changes discussed in RD appointments and nutrition classes. He has switched to sugar free soda, cut back on how much soda he has been drinking, while still drinking adequate water, ~64-80oz. He is still choosing leaner meats and portioning them at around the 3-4oz recommendation. Has a good support system at home, his  wife also reads labels and together they try to limit sodium and saturated fat.    Expected Outcome STG: continue to drink adequate water, cut back on sugary beverages and read labels to limit sodium and saturated fat/ LTG: maintain a heart healthy lifestyle             Psychosocial: Target Goals: Acknowledge presence or absence of significant depression and/or stress, maximize coping skills, provide positive support system. Participant is able to verbalize types and ability to use techniques and skills needed for reducing stress and depression.   Education: Stress, Anxiety, and Depression - Group verbal and visual presentation to define topics covered.  Reviews how body is impacted by stress, anxiety, and depression.  Also discusses healthy ways to reduce stress and to treat/manage anxiety and depression.  Written material given at graduation. Flowsheet Row Cardiac Rehab from 08/21/2023 in [facility] Cardiac and Pulmonary Rehab  Date 07/03/23  Educator SB  Instruction Review Code 1- [employer] Understanding       Education: Sleep Hygiene -Provides group verbal and written instruction about how sleep can affect your health.  Define sleep hygiene, discuss sleep cycles and impact of sleep habits. Review good sleep hygiene tips.    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 Yes   wife, family     Barriers   Psychosocial barriers to participate in program There are no identifiable barriers or psychosocial needs.      Screening Interventions   Interventions Encouraged to exercise;Provide feedback about the scores to participant;To provide support and resources with identified psychosocial needs    Expected Outcomes Short Term goal: Utilizing psychosocial counselor,  staff and physician to assist with identification of specific Stressors or current issues interfering with healing process. Setting desired goal for each stressor or current issue identified.;Long Term Goal: Stressors or current issues are controlled or eliminated.;Short Term goal: Identification and review with participant of any Quality of Life or Depression concerns found by scoring the questionnaire.;Long Term goal: The participant improves quality of Life and PHQ9 Scores as seen by post scores and/or verbalization of changes             Quality of Life Scores:   Quality of Life - 08/14/23 0735       Quality of Life Scores   Health/Function Pre 28.29 %    Health/Function Post 30 %    Health/Function % Change 6.04 %    Socioeconomic Pre 30 %    Socioeconomic Post 30 %    Socioeconomic % Change  0 %    Psych/Spiritual Pre 30 %    Psych/Spiritual Post 30 %    Psych/Spiritual % Change 0 %    Family Pre 30 %    Family Post 30 %    Family % Change 0 %    GLOBAL Pre 29.29 %    GLOBAL Post 30 %    GLOBAL % Change 2.42 %            Scores of 19 and below usually indicate a poorer quality of life in these areas.  A difference of  2-3 points is a clinically meaningful difference.  A difference of 2-3 points in the total score of the Quality of Life Index has been associated with significant improvement in overall quality of life, self-image, physical symptoms, and general health in studies assessing change in quality of life.  PHQ-9: Review Flowsheet  More data exists      08/14/2023 06/10/2023 01/30/2023 12/25/2022 12/06/2021  Depression screen PHQ 2/9  Decreased Interest 0 0 0 0 0  Down, Depressed, Hopeless 0 0 0 0 0  PHQ - 2 Score 0 0 0 0 0  Altered sleeping 0 0 - - 0  Tired, decreased energy 0 1 - - 0  Change in appetite 0 0 - - 0  Feeling bad or failure about yourself  0 0 - - 0  Trouble concentrating 0 0 - - 0  Moving slowly or fidgety/restless 0 0 - - 0  Suicidal  thoughts 0 0 - - 0  PHQ-9 Score 0 1 - - 0  Difficult doing work/chores Not difficult at all Not difficult at all - - Not difficult at all   Interpretation of Total Score  Total Score Depression Severity:  1-4 = Minimal depression, 5-9 = Mild depression, 10-14 = Moderate depression, 15-19 = Moderately severe depression, 20-27 = Severe depression   Psychosocial Evaluation and Intervention:  Psychosocial Evaluation - 06/06/23 1349       Psychosocial Evaluation & Interventions   Interventions Encouraged to exercise with the program and follow exercise prescription;Stress management education    Comments Rakim is coming to cardiac rehab after his third MI. He and his wife are concerned about his health and he is very motivated to participate in the program. He enjoys golfing and spending time with his family. His wife and family are his main support system. He does take Tylenol  pm at night to help him sleep and states this is still working for him, but he has been a restless sleeper for a while now. He is ready to get started to learn more about heart healthy living.    Expected Outcomes Short: attend cardiac rehab for education and exercise. Long: develop and maintain positive self care habits    Continue Psychosocial Services  Follow up required by staff             Psychosocial Re-Evaluation:  Psychosocial Re-Evaluation     Row Name 07/12/23 0747 08/07/23 0801 08/12/23 0749         Psychosocial Re-Evaluation   Current issues with None Identified None Identified None Identified     Comments Patient reports no issues with their current mental states, sleep, stress, depression or anxiety. Will follow up with patient in a few weeks for any changes. Vijay reports no major stress concerns at this time. He enjoys playing golf for stress relief as well as yard work. He also believes exercise is a good stress reliever for him. He states that he has a good support system made up by his wife and  kids. He reports no issues with sleep at this time. He reports no major stress, anxiety or depression at this time. Asked about stres relief techniques. He feels exercise boost his mood, would liek to continue to exercise regularly after rehab. Spoke about [facility] and how that is a way he could still keep his routine. He reports he has a good support system with a close family.     Expected Outcomes Short: Continue to exercise regularly to support mental health and notify staff of any changes. Long: maintain mental health and well being through teaching of rehab or prescribed medications independently. Short: Continue to attend cardiac rehab for stress relief. Long: Continue to maintain positive outlook. STG: continue to exercise regularly, look into [facility]. LTG: continue to maintain positive outlook on health and daily life     Interventions Encouraged to attend Cardiac Rehabilitation for the exercise Encouraged to attend Cardiac Rehabilitation for the exercise Encouraged to attend Cardiac Rehabilitation for the exercise     Continue Psychosocial Services  Follow up required by staff Follow up required by staff Follow up required by staff              Psychosocial Discharge (Final Psychosocial Re-Evaluation):  Psychosocial Re-Evaluation - 08/12/23 0749       Psychosocial Re-Evaluation   Current issues with None Identified    Comments He reports no major stress, anxiety or depression at this time. Asked about stres relief techniques. He feels exercise boost his mood, would liek to continue to exercise regularly after rehab. Spoke about [facility] and how that is a way he could still keep his routine. He reports he has a good support system with a close family.    Expected Outcomes STG: continue to exercise regularly, look into [facility]. LTG: continue to maintain positive outlook on health  and daily life    Interventions Encouraged to attend Cardiac Rehabilitation for the exercise     Continue Psychosocial Services  Follow up required by staff             Vocational Rehabilitation: Provide vocational rehab assistance to qualifying candidates.   Vocational Rehab Evaluation & Intervention:  Vocational Rehab - 06/06/23 1338       Initial Vocational Rehab Evaluation & Intervention   Assessment shows need for Vocational Rehabilitation No             Education: Education Goals: Education classes will be provided on a variety of topics geared toward better understanding of heart health and risk factor modification. Participant will state understanding/return demonstration of topics presented as noted by education test scores.  Learning Barriers/Preferences:  Learning Barriers/Preferences - 06/06/23 1338       Learning Barriers/Preferences   Learning Barriers None    Learning Preferences None             General Cardiac Education Topics:  AED/CPR: - Group verbal and written instruction with the use of models to demonstrate the basic use of the AED with the basic ABC's of resuscitation.   Anatomy and Cardiac Procedures: - Group verbal and visual presentation and models provide information about basic cardiac anatomy and function. Reviews the testing methods done to diagnose heart disease and the outcomes of the test results. Describes the treatment choices: Medical Management, Angioplasty, or Coronary Bypass Surgery for treating various heart conditions including Myocardial Infarction, Angina, Valve Disease, and Cardiac Arrhythmias.  Written material given at graduation. Flowsheet Row Cardiac Rehab from 08/21/2023 in [facility] Cardiac and Pulmonary Rehab  Education need identified 06/17/23       Medication Safety: - Group verbal and visual instruction to review commonly prescribed medications for heart and lung disease. Reviews the medication, class of the drug, and side effects. Includes the steps to properly store meds and maintain the prescription  regimen.  Written material given at graduation. Flowsheet Row Cardiac Rehab from 08/21/2023 in [facility] Cardiac and Pulmonary Rehab  Date 08/21/23  Educator SB  Instruction Review Code 1- Verbalizes Understanding       Intimacy: - Group verbal instruction through game format to discuss how heart and lung disease can affect sexual intimacy. Written material given at graduation.. Flowsheet Row Cardiac Rehab from 08/21/2023 in [facility] Cardiac and Pulmonary Rehab  Date 07/24/23  Educator NT  Instruction Review Code 1- Verbalizes Understanding       Know Your Numbers and Heart Failure: - Group verbal and visual instruction to discuss disease risk factors for cardiac and pulmonary disease and treatment options.  Reviews associated critical values for Overweight/Obesity, Hypertension, Cholesterol, and Diabetes.  Discusses basics of heart failure: signs/symptoms and treatments.  Introduces Heart Failure Zone chart for action plan for heart failure.  Written material given at graduation. Flowsheet Row Cardiac Rehab from 08/21/2023 in [facility] Cardiac and Pulmonary Rehab  Education need identified 06/17/23  Date 06/19/23  Educator SB  Instruction Review Code 1- Verbalizes Understanding       Infection Prevention: - Provides verbal and written material to individual with discussion of infection control including proper hand washing and proper equipment cleaning during exercise session. Flowsheet Row Cardiac Rehab from 08/21/2023 in [facility] Cardiac and Pulmonary Rehab  Date 06/17/23  Educator [facility]  Instruction Review Code 1- Verbalizes Understanding       Falls Prevention: - Provides verbal and written material to individual with discussion of falls prevention  and safety. Flowsheet Row Cardiac Rehab from 08/21/2023 in [facility] Cardiac and Pulmonary Rehab  Date 06/17/23  Educator [facility]  Instruction Review Code 1- Verbalizes Understanding       Other: -Provides group and verbal instruction on various topics (see  comments)   Knowledge Questionnaire Score:  Knowledge Questionnaire Score - 08/14/23 0735       Knowledge Questionnaire Score   Pre Score 21/26    Post Score 22/26             Core Components/Risk Factors/Patient Goals at Admission:  Personal Goals and Risk Factors at Admission - 06/10/23 1155       Core Components/Risk Factors/Patient Goals on Admission    Weight Management Weight Loss    Hypertension Yes    Intervention Provide education on lifestyle modifcations including regular physical activity/exercise, weight management, moderate sodium restriction and increased consumption of fresh fruit, vegetables, and low fat dairy, alcohol moderation, and smoking cessation.;Monitor prescription use compliance.    Expected Outcomes Short Term: Continued assessment and intervention until BP is < 140/39mm HG in hypertensive participants. < 130/25mm HG in hypertensive participants with diabetes, heart failure or chronic kidney disease.;Long Term: Maintenance of blood pressure at goal levels.    Lipids Yes    Intervention Provide education and support for participant on nutrition & aerobic/resistive exercise along with prescribed medications to achieve LDL 70mg , HDL >40mg .    Expected Outcomes Short Term: Participant states understanding of desired cholesterol values and is compliant with medications prescribed. Participant is following exercise prescription and nutrition guidelines.;Long Term: Cholesterol controlled with medications as prescribed, with individualized exercise RX and with personalized nutrition plan. Value goals: LDL < 70mg , HDL > 40 mg.             Education:Diabetes - Individual verbal and written instruction to review signs/symptoms of diabetes, desired ranges of glucose level fasting, after meals and with exercise. Acknowledge that pre and post exercise glucose checks will be done for 3 sessions at entry of program.   Core Components/Risk Factors/Patient Goals  Review:   Goals and Risk Factor Review     Row Name 07/12/23 9257 08/07/23 0806 08/12/23 0752         Core Components/Risk Factors/Patient Goals Review   Personal Goals Review Weight Management/Obesity Weight Management/Obesity Weight Management/Obesity     Review Michaelangelo wants to lose a few pounds and reach a weight goal of 200 pounds. He has lost weight since his heart event at 226 pounds. At home he has a bike and a treadmill. Axle states that when he weighs at home he weighs 204 lb. He is down from 226 lb which is what he weighed when his most recent heart event took place. He is still working towards a weight goal of 200 lbs. He continues to practice dietary patterns he discussed with the RD and is exercising at home in addition to his three sessions a week in rehab. He is doing well with his weight loss goal of ~25lbs. Says he was 225 when he has his heart event and today is 96. He reports the regular exercise has helped alot, something he wasnt doing until rehab. Says the changes discussed with RD helped a lot too. Spoke with him about smaller portions and cutting back on sugary beverages. All positive changes, that when asked he reports are becoming more habits as he works to build a heart healthy lifestyle     Expected Outcomes Short: lose a few pounds in the next  couple weeks. Long: reach weight goal. Short: Continue to work towards weight goal through diet and exercise. Long: Reach weight goal. STG: Continue to work towards the goal weight with new heart health habits. LTG: reach and maintain goal weight              Core Components/Risk Factors/Patient Goals at Discharge (Final Review):   Goals and Risk Factor Review - 08/12/23 0752       Core Components/Risk Factors/Patient Goals Review   Personal Goals Review Weight Management/Obesity    Review He is doing well with his weight loss goal of ~25lbs. Says he was 225 when he has his heart event and today is 46. He reports the  regular exercise has helped alot, something he wasnt doing until rehab. Says the changes discussed with RD helped a lot too. Spoke with him about smaller portions and cutting back on sugary beverages. All positive changes, that when asked he reports are becoming more habits as he works to build a heart healthy lifestyle    Expected Outcomes STG: Continue to work towards the goal weight with new heart health habits. LTG: reach and maintain goal weight             ITP Comments:  ITP Comments     Row Name 06/06/23 1346 06/10/23 1146 06/17/23 0932 07/03/23 0956 07/31/23 1304   ITP Comments Initial phone call completed. Diagnosis can be found in [facility] 10/30. EP Orientation scheduled for Monday 11/25 at 10:30am. Completed and gym orientation. Initial ITP created and sent for review to Dr. Oneil Pinal, Medical Director. First full day of exercise!  Patient was oriented to gym and equipment including functions, settings, policies, and procedures.  Patient's individual exercise prescription and treatment plan were reviewed.  All starting workloads were established based on the results of the 6 minute walk test done at initial orientation visit.  The plan for exercise progression was also introduced and progression will be customized based on patient's performance and goals. 30 Day review completed. Medical Director ITP review done, changes made as directed, and signed approval by Medical Director.    new to program 30 Day review completed. Medical Director ITP review done, changes made as directed, and signed approval by Medical Director.    Row Name 08/23/23 0754           ITP Comments Keegen graduated today from  rehab with 36 sessions completed.  Details of the patient's exercise prescription and what He needs to do in order to continue the prescription and progress were discussed with patient.  Patient was given a copy of prescription and goals.  Patient verbalized understanding. Tremayne plans to  continue to exercise by using equipment he has at home.                Comments: Discharge ITP

## 2023-08-23 NOTE — Progress Notes (Signed)
 Daily Session Note  Patient Details  Name: Derrick Stone MRN: [id number] Date of Birth: [birth year] Referring Provider:   Flowsheet Row Cardiac Rehab from 06/10/2023 in [facility] Cardiac and Pulmonary Rehab  Referring Provider Dr. Cara Lovelace       Encounter Date: 08/23/2023  Check In:  Session Check In - 08/23/23 0753       Check-In   Supervising physician immediately available to respond to emergencies See telemetry face sheet for immediately available ER MD    Location [facility]-Cardiac & Pulmonary Rehab    Staff Present Othel Durand, RN, BSN, CCRP;Noah Tickle, BS, Exercise Physiologist;Joseph Hood RCP,RRT,BSRT    Virtual Visit No    Medication changes reported     No    Fall or balance concerns reported    No    Warm-up and Cool-down Performed on first and last piece of equipment    Resistance Training Performed Yes    VAD Patient? No    PAD/SET Patient? No      Pain Assessment   Currently in Pain? No/denies                Social History   Tobacco Use  Smoking Status Never  Smokeless Tobacco Never    Goals Met:  Independence with exercise equipment Exercise tolerated well No report of concerns or symptoms today  Goals Unmet:  Not Applicable  Comments:  Derrick Stone graduated today from  rehab with 36 sessions completed.  Details of the patient's exercise prescription and what He needs to do in order to continue the prescription and progress were discussed with patient.  Patient was given a copy of prescription and goals.  Patient verbalized understanding. Derrick Stone plans to continue to exercise by using equipment he has at home.    Dr. Oneil Pinal is Medical Director for [facility] Cardiac Rehabilitation.  Dr. Fuad Aleskerov is Medical Director for [facility] Pulmonary Rehabilitation.

## 2023-09-16 DIAGNOSIS — J018 Other acute sinusitis: Secondary | ICD-10-CM | POA: Diagnosis not present

## 2023-09-16 DIAGNOSIS — J302 Other seasonal allergic rhinitis: Secondary | ICD-10-CM | POA: Diagnosis not present

## 2023-10-24 ENCOUNTER — Ambulatory Visit (INDEPENDENT_AMBULATORY_CARE_PROVIDER_SITE_OTHER): Admitting: Podiatry

## 2023-10-24 DIAGNOSIS — M205X2 Other deformities of toe(s) (acquired), left foot: Secondary | ICD-10-CM

## 2023-10-24 NOTE — Progress Notes (Signed)
  Subjective:  Patient ID: Derrick Stone, male    DOB: 02/20/1951,  MRN: [id number]  Chief Complaint  Patient presents with   Toe Pain   73 y.o. male returns today for planned flexor tenotomy of the fourth toe digit digit.  Objective:  There were no vitals filed for this visit.  General AA&O x3. Normal mood and affect.  Vascular Pedal pulses palpable.  Neurologic Epicritic sensation grossly intact.  Dermatologic Pre-ulcerative callus at the tip of the left, fourth toe  Orthopedic: Semi-reducible hammertoe deformity left, fourth toe   Assessment & Plan:  Patient was evaluated and treated and all questions answered.  Hammertoe left third with pre-ulcerative callus -Flexor tenotomy as below. -Advised to remove the dressing in 24 hours and apply a band-aid and triple abx ointment every day thereafter.  Procedure: Flexor Tenotomy Indication for Procedure: toe with semi-reducible hammertoe with distal tip ulceration. Flexor tenotomy indicated to alleviate contracture, reduce pressure, and enhance healing of the ulceration. Location: left, fourth toe Anesthesia: Lidocaine 1% plain; 1.5 mL and Marcaine 0.5% plain; 1.5 mL digital block Instrumentation: #11 blade Technique: The toe was anesthetized as above and prepped in the usual fashion. The toe was exsanquinated and a tourniquet was secured at the base of the toe. An 18g needle was then used to percutaneously release the flexor tendon at the plantar surface of the toe with noted release of the hammertoe deformity. The incision was then dressed with antibiotic ointment and band-aid. Compression splint dressing applied. Patient tolerated the procedure well. Dressing: Dry, sterile, compression dressing. Disposition: Patient tolerated procedure well. Patient to return in 1 week for follow-up.      No follow-ups on file.

## 2023-11-07 ENCOUNTER — Ambulatory Visit (INDEPENDENT_AMBULATORY_CARE_PROVIDER_SITE_OTHER): Admitting: Podiatry

## 2023-11-07 DIAGNOSIS — M205X2 Other deformities of toe(s) (acquired), left foot: Secondary | ICD-10-CM

## 2023-11-07 NOTE — Progress Notes (Signed)
  Subjective:  Patient ID: Derrick Stone, male    DOB: October 13, 1950,  MRN: [id number]  Chief Complaint  Patient presents with   Toe Pain    Left foot follow up pt stated that his foot is doing better    73 y.o. male returns today for planned flexor tenotomy of the fourth toe digit digit.  Objective:  There were no vitals filed for this visit.  General AA&O x3. Normal mood and affect.  Vascular Pedal pulses palpable.  Neurologic Epicritic sensation grossly intact.  Dermatologic No further pre-ulcerative callus at the tip of the left, fourth toe  Orthopedic: No further semi-reducible hammertoe deformity left, fourth toe   Assessment & Plan:  Patient was evaluated and treated and all questions answered.  Hammertoe left third with pre-ulcerative callus - Clinically healed and officially discharge from my care if any foot and ankle issues in the future he will come back and see me.  Reduction of toe deformity noted.    No follow-ups on file.

## 2023-11-29 DIAGNOSIS — Z955 Presence of coronary angioplasty implant and graft: Secondary | ICD-10-CM | POA: Diagnosis not present

## 2023-11-29 DIAGNOSIS — F418 Other specified anxiety disorders: Secondary | ICD-10-CM | POA: Diagnosis not present

## 2023-11-29 DIAGNOSIS — I214 Non-ST elevation (NSTEMI) myocardial infarction: Secondary | ICD-10-CM | POA: Diagnosis not present

## 2023-11-29 DIAGNOSIS — R5383 Other fatigue: Secondary | ICD-10-CM | POA: Diagnosis not present

## 2023-11-29 DIAGNOSIS — R7303 Prediabetes: Secondary | ICD-10-CM | POA: Diagnosis not present

## 2023-11-29 DIAGNOSIS — E782 Mixed hyperlipidemia: Secondary | ICD-10-CM | POA: Diagnosis not present

## 2023-11-29 DIAGNOSIS — I2510 Atherosclerotic heart disease of native coronary artery without angina pectoris: Secondary | ICD-10-CM | POA: Diagnosis not present

## 2023-11-29 DIAGNOSIS — K219 Gastro-esophageal reflux disease without esophagitis: Secondary | ICD-10-CM | POA: Diagnosis not present

## 2023-11-29 DIAGNOSIS — I10 Essential (primary) hypertension: Secondary | ICD-10-CM | POA: Diagnosis not present

## 2023-12-26 ENCOUNTER — Other Ambulatory Visit: Payer: Self-pay | Admitting: Family Medicine

## 2023-12-26 DIAGNOSIS — E7800 Pure hypercholesterolemia, unspecified: Secondary | ICD-10-CM

## 2023-12-31 ENCOUNTER — Ambulatory Visit (INDEPENDENT_AMBULATORY_CARE_PROVIDER_SITE_OTHER): Payer: Medicare Other

## 2023-12-31 VITALS — Ht 73.0 in | Wt 202.0 lb

## 2023-12-31 DIAGNOSIS — Z0000 Encounter for general adult medical examination without abnormal findings: Secondary | ICD-10-CM

## 2023-12-31 DIAGNOSIS — Z1211 Encounter for screening for malignant neoplasm of colon: Secondary | ICD-10-CM

## 2023-12-31 NOTE — Patient Instructions (Signed)
 Mr. Derrick Stone , Thank you for taking time out of your busy schedule to complete your Annual Wellness Visit with me. I enjoyed our conversation and look forward to speaking with you again next year. I, as well as your care team,  appreciate your ongoing commitment to your health goals. Please review the following plan we discussed and let me know if I can assist you in the future. Your Game plan/ To Do List    Referrals: If you haven't heard from the office you've been referred to, please reach out to them at the phone provided.  Lab Orders         Cologuard    They will send the kit to your home. Follow the instructions and send it back for testing. If it is negative, it is good for 3 years. If it is positive, you will then need a screening colonscopy.  Follow up Visits: Next Medicare AWV with our clinical staff: January 06, 2025 at 9:30 am video visit   Have you seen your provider in the last 6 months (3 months if uncontrolled diabetes)? Yes Next Office Visit with your provider: January 04, 2024 at 8:20 in office visit  Clinician Recommendations:  Aim for 30 minutes of exercise or brisk walking, 6-8 glasses of water, and 5 servings of fruits and vegetables each day.       This is a list of the screening recommended for you and due dates:  Health Maintenance  Topic Date Due   COVID-19 Vaccine (1) Never done   Cologuard (Stool DNA test)  Never done   DTaP/Tdap/Td vaccine (2 - Td or Tdap) 04/05/2019   Flu Shot  02/14/2024   Medicare Annual Wellness Visit  12/30/2024   Pneumococcal Vaccine for age over 84  Completed   Hepatitis C Screening  Completed   Zoster (Shingles) Vaccine  Completed   HPV Vaccine  Aged Out   Meningitis B Vaccine  Aged Out   Colon Cancer Screening  Discontinued    Advanced directives: (Provided) Advance directive discussed with you today. I have provided a copy for you to complete at home and have notarized. Once this is complete, please bring a copy in to our office so we  can scan it into your chart.  Advance Care Planning is important because it:  [x]  Makes sure you receive the medical care that is consistent with your values, goals, and preferences  [x]  It provides guidance to your family and loved ones and reduces their decisional burden about whether or not they are making the right decisions based on your wishes.  Follow the link provided in your after visit summary or read over the paperwork we have mailed to you to help you started getting your Advance Directives in place. If you need assistance in completing these, please reach out to us  so that we can help you!  Understanding Your Risk for Falls Millions of people have serious injuries from falls each year. It is important to understand your risk of falling. Talk with your health care provider about your risk and what you can do to lower it. If you do have a serious fall, make sure to tell your provider. Falling once raises your risk of falling again. How can falls affect me? Serious injuries from falls are common. These include: Broken bones, such as hip fractures. Head injuries, such as traumatic brain injuries (TBI) or concussions. A fear of falling can cause you to avoid activities and stay at home. This  can make your muscles weaker and raise your risk for a fall. What can increase my risk? There are a number of risk factors that increase your risk for falling. The more risk factors you have, the higher your risk of falling. Serious injuries from a fall happen most often to people who are older than 71 years old. Teenagers and young adults ages 45-29 are also at higher risk. Common risk factors include: Weakness in the lower body. Being generally weak or confused due to long-term (chronic) illness. Dizziness or balance problems. Poor vision. Medicines that cause dizziness or drowsiness. These may include: Medicines for your blood pressure, heart, anxiety, insomnia, or swelling (edema). Pain  medicines. Muscle relaxants. Other risk factors include: Drinking alcohol. Having had a fall in the past. Having foot pain or wearing improper footwear. Working at a dangerous job. Having any of the following in your home: Tripping hazards, such as floor clutter or loose rugs. Poor lighting. Pets. Having dementia or memory loss. What actions can I take to lower my risk of falling?     Physical activity Stay physically fit. Do strength and balance exercises. Consider taking a regular class to build strength and balance. Yoga and tai chi are good options. Vision Have your eyes checked every year and your prescription for glasses or contacts updated as needed. Shoes and walking aids Wear non-skid shoes. Wear shoes that have rubber soles and low heels. Do not wear high heels. Do not walk around the house in socks or slippers. Use a cane or walker as told by your provider. Home safety Attach secure railings on both sides of your stairs. Install grab bars for your bathtub, shower, and toilet. Use a non-skid mat in your bathtub or shower. Attach bath mats securely with double-sided, non-slip rug tape. Use good lighting in all rooms. Keep a flashlight near your bed. Make sure there is a clear path from your bed to the bathroom. Use night-lights. Do not use throw rugs. Make sure all carpeting is taped or tacked down securely. Remove all clutter from walkways and stairways, including extension cords. Repair uneven or broken steps and floors. Avoid walking on icy or slippery surfaces. Walk on the grass instead of on icy or slick sidewalks. Use ice melter to get rid of ice on walkways in the winter. Use a cordless phone. Questions to ask your health care provider Can you help me check my risk for a fall? Do any of my medicines make me more likely to fall? Should I take a vitamin D supplement? What exercises can I do to improve my strength and balance? Should I make an appointment to have  my vision checked? Do I need a bone density test to check for weak bones (osteoporosis)? Would it help to use a cane or a walker? Where to find more information Centers for Disease Control and Prevention, STEADI: TonerPromos.no Community-Based Fall Prevention Programs: TonerPromos.no [employer] on Aging: BaseRingTones.pl Contact a health care provider if: You fall at home. You are afraid of falling at home. You feel weak, drowsy, or dizzy. This information is not intended to replace advice given to you by your health care provider. Make sure you discuss any questions you have with your health care provider. Document Revised: 03/05/2022 Document Reviewed: 03/05/2022 Elsevier Patient Education  2024 [employer].

## 2023-12-31 NOTE — Progress Notes (Signed)
 Subjective:   Derrick Stone is a 73 y.o. who presents for a Medicare Wellness preventive visit.  As a reminder, Annual Wellness Visits don't include a physical exam, and some assessments may be limited, especially if this visit is performed virtually. We may recommend an in-person follow-up visit with your provider if needed.  Visit Complete: Virtual I connected with  Derrick Stone on 12/31/23 by a audio enabled telemedicine application and verified that I am speaking with the correct person using two identifiers.  Patient Location: Home  Provider Location: Home Office  I discussed the limitations of evaluation and management by telemedicine. The patient expressed understanding and agreed to proceed.  Vital Signs: Because this visit was a virtual/telehealth visit, some criteria may be missing or patient reported. Any vitals not documented were not able to be obtained and vitals that have been documented are patient reported.  VideoDeclined- This patient declined Librarian, academic. Therefore the visit was completed with audio only.  Persons Participating in Visit: Patient.  AWV Questionnaire: No: Patient Medicare AWV questionnaire was not completed prior to this visit.  Cardiac Risk Factors include: advanced age (>74men, >2 women);hypertension;male gender;dyslipidemia;Other (see comment), Risk factor comments: CAD, had third MI in OCT 2024     Objective:    Today's Vitals   12/31/23 1358  Weight: 202 lb (91.6 kg)  Height: 6' 1 (1.854 m)  Blood pressure: Telehealth visit. Unable to obtain bp  Body mass index is 26.65 kg/m.     12/31/2023    1:59 PM 06/06/2023    1:35 PM 05/15/2023   10:15 AM 12/25/2022   10:10 AM 09/20/2020   12:24 PM 09/19/2020    4:13 PM 08/31/2019    8:27 AM  Advanced Directives  Does Patient Have a Medical Advance Directive? No No No Yes No No Yes  Type of Tax inspector;Living will   Copy of Healthcare Power of Attorney in Chart?       No - copy requested  Would patient like information on creating a medical advance directive? Yes (MAU/Ambulatory/Procedural Areas - Information given) No - Patient declined No - Patient declined  No - Patient declined      Current Medications (verified) Outpatient Encounter Medications as of 12/31/2023  Medication Sig   aspirin  EC 81 MG tablet Take 81 mg by mouth daily.   atorvastatin  (LIPITOR ) 80 MG tablet Take 1 tablet (80 mg total) by mouth daily.   buPROPion (WELLBUTRIN XL) 150 MG 24 hr tablet Take 75 mg by mouth daily. Patient can't tolerate 150mg  daily.   clopidogrel  (PLAVIX ) 75 MG tablet Take 1 tablet (75 mg total) by mouth daily.   escitalopram  (LEXAPRO ) 10 MG tablet TAKE 1 TABLET (10 MG TOTAL) BY MOUTH ONCE DAILY.   ezetimibe  (ZETIA ) 10 MG tablet TAKE 1 TABLET BY MOUTH EVERY DAY   fluticasone (FLONASE) 50 MCG/ACT nasal spray Place into both nostrils.   losartan  (COZAAR ) 25 MG tablet Take 1 tablet (25 mg total) by mouth daily. (Patient taking differently: Take 12.5 mg by mouth daily.)   metoprolol  succinate (TOPROL -XL) 25 MG 24 hr tablet Take 1 tablet by mouth daily.   mupirocin  ointment (BACTROBAN ) 2 % Apply daily with bandage change   Omega-3 Fatty Acids (FISH OIL PO) Take 1 capsule by mouth daily.   omeprazole  (PRILOSEC  OTC) 20 MG tablet Take 1 tablet by mouth daily.   omeprazole  (PRILOSEC ) 20 MG capsule Take 1  tablet by mouth daily.   No facility-administered encounter medications on file as of 12/31/2023.    Allergies (verified) Gold-containing drug products and Nitroglycerin    History: Past Medical History:  Diagnosis Date   CAD (coronary artery disease)    History of chickenpox    History of mumps    Hyperlipidemia    Hypertension    NSTEMI (non-ST elevated myocardial infarction) (HCC) 09/19/2020   Past Surgical History:  Procedure Laterality Date   CORONARY STENT INTERVENTION N/A 09/20/2020   Procedure: CORONARY  STENT INTERVENTION;  Surgeon: Antonette Batters, MD;  Location: [facility] INVASIVE CV [facility];  Service: Cardiovascular;  Laterality: N/A;   CORONARY ULTRASOUND/IVUS N/A 05/15/2023   Procedure: Coronary Ultrasound/IVUS;  Surgeon: Antonette Batters, MD;  Location: [facility] CV [facility];  Service: Cardiovascular;  Laterality: N/A;   Heart artery stent  2008   x2   HERNIA REPAIR  2016   LEFT HEART CATH AND CORONARY ANGIOGRAPHY N/A 09/20/2020   Procedure: LEFT HEART CATH AND CORONARY ANGIOGRAPHY and possible PCI and stent;  Surgeon: Antonette Batters, MD;  Location: [facility];  Service: Cardiovascular;  Laterality: N/A;   LEFT HEART CATH AND CORONARY ANGIOGRAPHY N/A 05/15/2023   Procedure: LEFT HEART CATH AND CORONARY ANGIOGRAPHY;  Surgeon: Antonette Batters, MD;  Location: [facility] INVAS[facility] CV [facility];  Service: Cardiovascular;  Laterality: N/A;   Family History  Problem Relation Age of Onset   Melanoma Mother    Diabetes Father    Bone cancer Father    Testicular cancer Brother    Heart disease Paternal Grandfather    Social History   Socioeconomic History   Marital status: Married    Spouse name: Not on file   Number of children: 2   Years of education: Not on file   Highest education level: Bachelor's degree (e.g., BA, AB, BS)  Occupational History    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  Tobacco Use   Smoking status: Never   Smokeless tobacco: Never  Vaping Use   Vaping status: Never Used  Substance and Sexual Activity   Alcohol use: No   Drug use: No   Sexual activity: Not on file  Other Topics Concern   Not on file  Social History Narrative   Not on file   Social Drivers of Health   Financial Resource Strain: Low Risk  (12/31/2023)   Overall Financial Resource Strain (CARDIA)    Difficulty of Paying Living Expenses: Not hard at all  Food Insecurity: No Food Insecurity (12/31/2023)   Hunger Vital Sign    Worried About Running Out of Food in the Last  Year: Never true    Ran Out of Food in the Last Year: Never true  Transportation Needs: No Transportation Needs (12/31/2023)   PRAPARE - Administrator, Civil Service (Medical): No    Lack of Transportation (Non-Medical): No  Physical Activity: Sufficiently Active (12/31/2023)   Exercise Vital Sign    Days of Exercise per Week: 7 days    Minutes of Exercise per Session: 30 min  Stress: No Stress Concern Present (12/31/2023)   [employer] of Occupational Health - Occupational Stress Questionnaire    Feeling of Stress: Not at all  Social Connections: Moderately Integrated (12/31/2023)   Social Connection and Isolation Panel    Frequency of Communication with Friends and Family: More than three times a week    Frequency of Social Gatherings with Friends and Family: More than three  times a week    Attends Religious Services: Never    Active Member of Clubs or Organizations: Yes    Attends Engineer, structural: More than 4 times per year    Marital Status: Married    Tobacco Counseling Counseling given: Yes    Clinical Intake:  Pre-visit preparation completed: Yes  Pain : No/denies pain     BMI - recorded: 26.65 Nutritional Status: BMI 25 -29 Overweight Nutritional Risks: None Diabetes: No  Lab Results  Component Value Date   HGBA1C 6.0 (H) 01/30/2023   HGBA1C 6.1 (H) 12/06/2021   HGBA1C 6.1 (H) 09/21/2020     How often do you need to have someone help you when you read instructions, pamphlets, or other written materials from your doctor or pharmacy?: 1 - Never  Interpreter Needed?: No  Information entered by :: Sally Crazier CMA   Activities of Daily Living     12/31/2023    2:01 PM 05/15/2023   10:15 AM  In your present state of health, do you have any difficulty performing the following activities:  Hearing? 0 0  Vision? 0 0  Difficulty concentrating or making decisions? 0 0  Walking or climbing stairs? 0   Dressing or bathing? 0   Doing  errands, shopping? 0 0  Preparing Food and eating ? N   Using the Toilet? N   In the past six months, have you accidently leaked urine? N   Do you have problems with loss of bowel control? N   Managing your Medications? N   Managing your Finances? N   Housekeeping or managing your Housekeeping? N     Patient Care Team: Lamon Pillow, MD as PCP - General (Family Medicine) Elta Halter, MD (Dermatology) Bary Boss., MD (Ophthalmology) Von Grumbling, MD as Referring Physician (Otolaryngology) Antonette Batters, MD as Consulting Physician (Cardiology)  I have updated your Care Teams any recent Medical Services you may have received from other providers in the past year.     Assessment:   This is a routine wellness examination for Derrick Stone.  Hearing/Vision screen Hearing Screening - Comments:: Patient denies any hearing difficulties.   Vision Screening - Comments:: Wears rx glasses - up to date with routine eye exams  Sees [facility]   Goals Addressed             This Visit's Progress    Patient Stated       Continue to lose weight and eat healthy. Had third heart attack in October 2024       Depression Screen     12/31/2023    1:59 PM 08/14/2023    7:37 AM 06/10/2023   11:56 AM 01/30/2023    8:24 AM 12/25/2022   10:07 AM 12/06/2021   10:06 AM 10/24/2020    8:21 AM  PHQ 2/9 Scores  PHQ - 2 Score 0 0 0 0 0 0 0  PHQ- 9 Score 0 0 1   0 0    Fall Risk     12/31/2023    9:57 AM 06/06/2023    1:35 PM 01/30/2023    8:24 AM 12/25/2022   10:03 AM 12/06/2021   10:06 AM  Fall Risk   Falls in the past year? 0 0 0 0 0  Number falls in past yr: 0  0 0 0  Injury with Fall? 0 0 0 0 0  Risk for fall due to : No Fall Risks  No  Fall Risks No Fall Risks No Fall Risks  Follow up Falls evaluation completed;Education provided;Falls prevention discussed  Falls evaluation completed Education provided;Falls prevention discussed Falls evaluation completed      Data  saved with a previous flowsheet row definition    MEDICARE RISK AT HOME:  Medicare Risk at Home Any stairs in or around the home?: Yes If so, are there any without handrails?: No Home free of loose throw rugs in walkways, pet beds, electrical cords, etc?: Yes Adequate lighting in your home to reduce risk of falls?: Yes Life alert?: No Use of a cane, walker or w/c?: No Grab bars in the bathroom?: No Shower chair or bench in shower?: Yes Elevated toilet seat or a handicapped toilet?: No  TIMED UP AND GO:  Was the test performed?  No  Cognitive Function: 6CIT completed        12/31/2023    2:03 PM 12/25/2022   10:21 AM 12/06/2021   10:01 AM 08/29/2018   10:10 AM  6CIT Screen  What Year? 0 points 0 points 0 points 0 points  What month? 0 points 0 points 0 points 0 points  What time? 0 points 0 points 0 points 0 points  Count back from 20 0 points 0 points 0 points 0 points  Months in reverse 0 points 0 points 0 points 0 points  Repeat phrase 0 points 0 points 2 points 0 points  Total Score 0 points 0 points 2 points 0 points    Immunizations Immunization History  Administered Date(s) Administered   Fluad Quad(high Dose 65+) 08/31/2019   Influenza Split 04/04/2009   Influenza, High Dose Seasonal PF 08/08/2018   Influenza,inj,Quad PF,6+ Mos 07/23/2013, 04/12/2017   Pneumococcal Conjugate-13 10/24/2020   Pneumococcal Polysaccharide-23 08/29/2018   Tdap 04/04/2009   Zoster Recombinant(Shingrix ) 04/12/2017, 06/12/2017   Zoster, Live 07/23/2013    Screening Tests Health Maintenance  Topic Date Due   COVID-19 Vaccine (1) Never done   Fecal DNA (Cologuard)  Never done   DTaP/Tdap/Td (2 - Td or Tdap) 04/05/2019   INFLUENZA VACCINE  02/14/2024   Medicare Annual Wellness (AWV)  12/30/2024   Pneumococcal Vaccine: 50+ Years  Completed   Hepatitis C Screening  Completed   Zoster Vaccines- Shingrix   Completed   HPV VACCINES  Aged Out   Meningococcal B Vaccine  Aged Out    Colonoscopy  Discontinued    Health Maintenance  Health Maintenance Due  Topic Date Due   COVID-19 Vaccine (1) Never done   Fecal DNA (Cologuard)  Never done   DTaP/Tdap/Td (2 - Td or Tdap) 04/05/2019   Health Maintenance Items Addressed: Cologuard Ordered  Additional Screening:  Vision Screening: Recommended annual ophthalmology exams for early detection of glaucoma and other disorders of the eye. Would you like a referral to an eye doctor? No    Dental Screening: Recommended annual dental exams for proper oral hygiene  Community Resource Referral / Chronic Care Management: CRR required this visit?  No   CCM required this visit?  No   Plan: Cologuard ordered for patient. He wants to complete the colorectal cancer screening but is unable to tolerate the prep of a routine screening colonoscopy. Patient verbalizes understanding of treatment plan and is in agreement with plan. They have been provided with a copy of their treatment plan.      I have personally reviewed and noted the following in the patient's chart:   Medical and social history Use of alcohol, tobacco or illicit drugs  Current medications and supplements including opioid prescriptions. Patient is not currently taking opioid prescriptions. Functional ability and status Nutritional status Physical activity Advanced directives List of other physicians Hospitalizations, surgeries, and ER visits in previous 12 months Vitals Screenings to include cognitive, depression, and falls Referrals and appointments  In addition, I have reviewed and discussed with patient certain preventive protocols, quality metrics, and best practice recommendations. A written personalized care plan for preventive services as well as general preventive health recommendations were provided to patient.   Shadavia Dampier, CMA   12/31/2023   After Visit Summary: (MyChart) Due to this being a telephonic visit, the after visit summary with  patients personalized plan was offered to patient via MyChart   Notes: Please refer to comments under Plan section in this note

## 2024-01-09 DIAGNOSIS — Z1211 Encounter for screening for malignant neoplasm of colon: Secondary | ICD-10-CM | POA: Diagnosis not present

## 2024-01-15 LAB — COLOGUARD: COLOGUARD: NEGATIVE

## 2024-02-03 ENCOUNTER — Ambulatory Visit (INDEPENDENT_AMBULATORY_CARE_PROVIDER_SITE_OTHER): Payer: Self-pay | Admitting: Family Medicine

## 2024-02-03 ENCOUNTER — Encounter: Payer: Self-pay | Admitting: Family Medicine

## 2024-02-03 VITALS — BP 117/71 | HR 68 | Temp 97.7°F | Ht 73.0 in | Wt 206.1 lb

## 2024-02-03 DIAGNOSIS — R7303 Prediabetes: Secondary | ICD-10-CM

## 2024-02-03 DIAGNOSIS — E7800 Pure hypercholesterolemia, unspecified: Secondary | ICD-10-CM

## 2024-02-03 DIAGNOSIS — I2510 Atherosclerotic heart disease of native coronary artery without angina pectoris: Secondary | ICD-10-CM

## 2024-02-03 DIAGNOSIS — I10 Essential (primary) hypertension: Secondary | ICD-10-CM | POA: Diagnosis not present

## 2024-02-03 DIAGNOSIS — I252 Old myocardial infarction: Secondary | ICD-10-CM

## 2024-02-03 DIAGNOSIS — Z125 Encounter for screening for malignant neoplasm of prostate: Secondary | ICD-10-CM

## 2024-02-03 NOTE — Patient Instructions (Signed)
 Marland Kitchen  Please review the attached list of medications and notify my office if there are any errors.   . Please bring all of your medications to every appointment so we can make sure that our medication list is the same as yours.

## 2024-02-03 NOTE — Progress Notes (Signed)
 Established patient visit   Patient: Derrick Stone   DOB: Jul 29, 1950   73 y.o. Male  MRN: [id number] Visit Date: 02/03/2024  Today's healthcare provider: Nancyann Perry, MD   Chief Complaint  Patient presents with   Annual Exam    Diet: Low fat, low carb, low sugar Exercise: Yes, 3x week 45 min. Did heart track wellness program for 16 weeks  Sleep: okay Overall feeling: Great    Subjective    Discussed the use of AI scribe software for clinical note transcription with the patient, who gave verbal consent to proceed.  History of Present Illness   Derrick Stone is a 73 year old male with hypertension, prediabetes, hyperlipidemia, and coronary artery disease who presents for a routine follow-up.  He had another myocardial infarction in October of the previous year, for which he underwent a balloon angioplasty. Since then, he has been on blood thinners, aspirin , and Plavix  without any issues. He continues regular follow up with Dr. Florencio.   For hyperlipidemia, he is taking Zetia  along with atorvastatin . He also continues to take omega-3 fatty acids and Prilosec , the latter being a prescription medication. He denies any side effects from these medications.  He has not had any blood work since October and is fasting today for routine blood tests.  No hearing problems, tinnitus, gastrointestinal issues, changes in bowel habits, or skin lesions. He sees an eye doctor and a dermatologist regularly, with no significant findings reported.     Lab Results  Component Value Date   HGBA1C 6.0 (H) 01/30/2023   HGBA1C 6.1 (H) 12/06/2021   HGBA1C 6.1 (H) 09/21/2020   Lab Results  Component Value Date   CHOL 151 01/30/2023   HDL 45 01/30/2023   LDLCALC 87 01/30/2023   TRIG 105 01/30/2023   CHOLHDL 3.4 01/30/2023   Lab Results  Component Value Date   NA 135 05/17/2023   K 4.0 05/17/2023   CREATININE 1.09 05/17/2023   GFRNONAA >60 05/17/2023   GLUCOSE 110 (H) 05/17/2023    Patient Care Team: Perry Nancyann BRAVO, MD as PCP - General (Family Medicine) Hester Alm BROCKS, MD (Dermatology) Carolee Manus DASEN., MD (Ophthalmology) Blair Mt, MD as Referring Physician (Otolaryngology) Florencio Cara BIRCH, MD as Consulting Physician (Cardiology)   Medications: Outpatient Medications Prior to Visit  Medication Sig   aspirin  EC 81 MG tablet Take 81 mg by mouth daily.   atorvastatin  (LIPITOR ) 80 MG tablet Take 1 tablet (80 mg total) by mouth daily.   buPROPion (WELLBUTRIN XL) 150 MG 24 hr tablet Take 75 mg by mouth daily. Patient can't tolerate 150mg  daily.   escitalopram  (LEXAPRO ) 10 MG tablet TAKE 1 TABLET (10 MG TOTAL) BY MOUTH ONCE DAILY.   ezetimibe  (ZETIA ) 10 MG tablet TAKE 1 TABLET BY MOUTH EVERY DAY   losartan  (COZAAR ) 25 MG tablet Take 1 tablet (25 mg total) by mouth daily.   metoprolol  succinate (TOPROL -XL) 25 MG 24 hr tablet Take 1 tablet by mouth daily.   Omega-3 Fatty Acids (FISH OIL PO) Take 1 capsule by mouth daily.   omeprazole  (PRILOSEC ) 20 MG capsule Take 1 tablet by mouth daily.   clopidogrel  (PLAVIX ) 75 MG tablet Take 1 tablet (75 mg total) by mouth daily.   fluticasone (FLONASE) 50 MCG/ACT nasal spray Place into both nostrils.   No facility-administered medications prior to visit.   Review of Systems     Objective    BP 117/71 (BP Location: Right Arm, Patient Position:  Sitting, Cuff Size: Normal)   Pulse 68   Temp 97.7 F (36.5 C) (Oral)   Ht 6' 1 (1.854 m)   Wt 206 lb 1.6 oz (93.5 kg)   SpO2 99%   BMI 27.19 kg/m   Physical Exam   General: Appearance:    Well developed, well nourished male in no acute distress  Eyes:    PERRL, conjunctiva/corneas clear, EOM's intact       Lungs:     Clear to auscultation bilaterally, respirations unlabored  Heart:    Normal heart rate. Normal rhythm. No murmurs, rubs, or gallops.    MS:   All extremities are intact.    Neurologic:   Awake, alert, oriented x 3. No apparent focal neurological  defect.         Assessment & Plan    1. Primary hypertension (Primary) Well controlled.  Continue current medications.    2. Atherosclerosis of native coronary artery of native heart without angina pectoris Asymptomatic. Compliant with medication.  Continue aggressive risk factor modification.  Continue routine follow up cardiology.   3. Pre-diabetes  - Hemoglobin A1c  4. Pure hypercholesterolemia He is tolerating atorvastatin  and ezetimibe  well with no adverse effects.   - CBC - Comprehensive metabolic panel with GFR - Lipid panel  5. Healed myocardial infarct   6. Prostate cancer screening  - PSA Total (Reflex To Free)  Return in about 1 year (around 02/02/2025) for Hypertension.     Nancyann Perry, MD  [facility] [phone] (phone) [phone] (fax)  [facility]

## 2024-02-04 ENCOUNTER — Ambulatory Visit: Payer: Self-pay | Admitting: Family Medicine

## 2024-02-04 LAB — COMPREHENSIVE METABOLIC PANEL WITH GFR
ALT: 23 IU/L (ref 0–44)
AST: 29 IU/L (ref 0–40)
Albumin: 4.6 g/dL (ref 3.9–4.9)
Alkaline Phosphatase: 48 IU/L (ref 44–121)
BUN/Creatinine Ratio: 14 (ref 10–24)
BUN: 17 mg/dL (ref 8–27)
Bilirubin Total: 0.4 mg/dL (ref 0.0–1.2)
CO2: 21 mmol/L (ref 20–29)
Calcium: 9.4 mg/dL (ref 8.6–10.2)
Chloride: 102 mmol/L (ref 96–106)
Creatinine, Ser: 1.23 mg/dL (ref 0.76–1.27)
Globulin, Total: 2.5 g/dL (ref 1.5–4.5)
Glucose: 95 mg/dL (ref 70–99)
Potassium: 4.7 mmol/L (ref 3.5–5.2)
Sodium: 138 mmol/L (ref 134–144)
Total Protein: 7.1 g/dL (ref 6.0–8.5)
eGFR: 63 mL/min/1.73 (ref >59)

## 2024-02-04 LAB — LIPID PANEL
Chol/HDL Ratio: 2.5 ratio (ref 0.0–5.0)
Cholesterol, Total: 122 mg/dL (ref 100–199)
HDL: 48 mg/dL (ref >39)
LDL Chol Calc (NIH): 58 mg/dL (ref 0–99)
Triglycerides: 80 mg/dL (ref 0–149)
VLDL Cholesterol Cal: 16 mg/dL (ref 5–40)

## 2024-02-04 LAB — HEMOGLOBIN A1C
Est. average glucose Bld gHb Est-mCnc: 126 mg/dL
Hgb A1c MFr Bld: 6 % — ABNORMAL HIGH (ref 4.8–5.6)

## 2024-02-04 LAB — CBC
Hematocrit: 44 % (ref 37.5–51.0)
Hemoglobin: 14.4 g/dL (ref 13.0–17.7)
MCH: 30.5 pg (ref 26.6–33.0)
MCHC: 32.7 g/dL (ref 31.5–35.7)
MCV: 93 fL (ref 79–97)
Platelets: 119 x10E3/uL — ABNORMAL LOW (ref 150–450)
RBC: 4.72 x10E6/uL (ref 4.14–5.80)
RDW: 12.5 % (ref 11.6–15.4)
WBC: 9.8 x10E3/uL (ref 3.4–10.8)

## 2024-02-04 LAB — PSA TOTAL (REFLEX TO FREE): Prostate Specific Ag, Serum: 0.8 ng/mL (ref 0.0–4.0)

## 2024-02-17 DIAGNOSIS — F418 Other specified anxiety disorders: Secondary | ICD-10-CM | POA: Diagnosis not present

## 2024-02-17 DIAGNOSIS — R5383 Other fatigue: Secondary | ICD-10-CM | POA: Diagnosis not present

## 2024-02-17 DIAGNOSIS — I214 Non-ST elevation (NSTEMI) myocardial infarction: Secondary | ICD-10-CM | POA: Diagnosis not present

## 2024-02-17 DIAGNOSIS — Z955 Presence of coronary angioplasty implant and graft: Secondary | ICD-10-CM | POA: Diagnosis not present

## 2024-02-17 DIAGNOSIS — E782 Mixed hyperlipidemia: Secondary | ICD-10-CM | POA: Diagnosis not present

## 2024-02-17 DIAGNOSIS — I10 Essential (primary) hypertension: Secondary | ICD-10-CM | POA: Diagnosis not present

## 2024-02-17 DIAGNOSIS — R7303 Prediabetes: Secondary | ICD-10-CM | POA: Diagnosis not present

## 2024-02-17 DIAGNOSIS — K219 Gastro-esophageal reflux disease without esophagitis: Secondary | ICD-10-CM | POA: Diagnosis not present

## 2024-02-17 DIAGNOSIS — I2510 Atherosclerotic heart disease of native coronary artery without angina pectoris: Secondary | ICD-10-CM | POA: Diagnosis not present

## 2024-04-01 DIAGNOSIS — H25813 Combined forms of age-related cataract, bilateral: Secondary | ICD-10-CM | POA: Diagnosis not present

## 2024-05-05 ENCOUNTER — Ambulatory Visit (INDEPENDENT_AMBULATORY_CARE_PROVIDER_SITE_OTHER): Admitting: Podiatry

## 2024-05-05 DIAGNOSIS — M205X2 Other deformities of toe(s) (acquired), left foot: Secondary | ICD-10-CM | POA: Diagnosis not present

## 2024-05-05 NOTE — Progress Notes (Signed)
  Subjective:  Patient ID: DYER KLUG, male    DOB: 02/01/1951,  MRN: [id number]  Chief Complaint  Patient presents with   Foot Pain    Left foot has a large greenish bruise on the top. He states the tendons in toes 3 and 4 have been cut. He thinks toe 5 is curved under and he can feel it when he walks   73 y.o. male returns today for planned flexor tenotomy of the left fifth digit.  Objective:  There were no vitals filed for this visit.  General AA&O x3. Normal mood and affect.  Vascular Pedal pulses palpable.  Neurologic Epicritic sensation grossly intact.  Dermatologic Pre-ulcerative callus at the tip of the left, 5th toe  Orthopedic: Semi-reducible hammertoe deformity left, 5th toe    Assessment & Plan:  Patient was evaluated and treated and all questions answered.  Hammertoe left fifth with pre-ulcerative callus -Flexor tenotomy as below. -Advised to remove the dressing in 24 hours and apply a band-aid and triple abx ointment every day thereafter.  Procedure: Flexor Tenotomy Indication for Procedure: toe with semi-reducible hammertoe with distal tip ulceration. Flexor tenotomy indicated to alleviate contracture, reduce pressure, and enhance healing of the ulceration. Location: left, 5th toe Anesthesia: Lidocaine  1% plain; 1.5 mL and Marcaine 0.5% plain; 1.5 mL digital block Instrumentation: 18 g needle  Technique: The toe was anesthetized as above and prepped in the usual fashion. The toe was exsanquinated and a tourniquet was secured at the base of the toe. An 18g needle was then used to percutaneously release the flexor tendon at the plantar surface of the toe with noted release of the hammertoe deformity. The incision was then dressed with antibiotic ointment and band-aid. Compression splint dressing applied. Patient tolerated the procedure well. Dressing: Dry, sterile, compression dressing. Disposition: Patient tolerated procedure well. Patient to return in 1 week for  follow-up.      No follow-ups on file.

## 2024-06-04 ENCOUNTER — Ambulatory Visit: Payer: Medicare Other | Admitting: Dermatology

## 2024-06-04 DIAGNOSIS — L2089 Other atopic dermatitis: Secondary | ICD-10-CM

## 2024-06-04 DIAGNOSIS — L570 Actinic keratosis: Secondary | ICD-10-CM | POA: Diagnosis not present

## 2024-06-04 DIAGNOSIS — Z86018 Personal history of other benign neoplasm: Secondary | ICD-10-CM

## 2024-06-04 DIAGNOSIS — D229 Melanocytic nevi, unspecified: Secondary | ICD-10-CM

## 2024-06-04 DIAGNOSIS — Z1283 Encounter for screening for malignant neoplasm of skin: Secondary | ICD-10-CM

## 2024-06-04 DIAGNOSIS — Z79899 Other long term (current) drug therapy: Secondary | ICD-10-CM

## 2024-06-04 DIAGNOSIS — D1801 Hemangioma of skin and subcutaneous tissue: Secondary | ICD-10-CM | POA: Diagnosis not present

## 2024-06-04 DIAGNOSIS — W908XXA Exposure to other nonionizing radiation, initial encounter: Secondary | ICD-10-CM | POA: Diagnosis not present

## 2024-06-04 DIAGNOSIS — L814 Other melanin hyperpigmentation: Secondary | ICD-10-CM | POA: Diagnosis not present

## 2024-06-04 DIAGNOSIS — Z7189 Other specified counseling: Secondary | ICD-10-CM

## 2024-06-04 DIAGNOSIS — L821 Other seborrheic keratosis: Secondary | ICD-10-CM | POA: Diagnosis not present

## 2024-06-04 DIAGNOSIS — L578 Other skin changes due to chronic exposure to nonionizing radiation: Secondary | ICD-10-CM | POA: Diagnosis not present

## 2024-06-04 MED ORDER — MOMETASONE FUROATE 0.1 % EX CREA: TOPICAL_CREAM | CUTANEOUS | 6 refills | Status: AC

## 2024-06-04 NOTE — Patient Instructions (Addendum)

## 2024-06-04 NOTE — Progress Notes (Signed)
 Follow-Up Visit   Subjective  Derrick Stone is a 73 y.o. male who presents for the following: Skin Cancer Screening and Full Body Skin Exam, hx of Dysplastic nevus   The patient presents for Total-Body Skin Exam (TBSE) for skin cancer screening and mole check. The patient has spots, moles and lesions to be evaluated, some may be new or changing and the patient may have concern these could be cancer.  The following portions of the chart were reviewed this encounter and updated as appropriate: medications, allergies, medical history  Review of Systems:  No other skin or systemic complaints except as noted in HPI or Assessment and Plan.  Objective  Well appearing patient in no apparent distress; mood and affect are within normal limits.  A full examination was performed including scalp, head, eyes, ears, nose, lips, neck, chest, axillae, abdomen, back, buttocks, bilateral upper extremities, bilateral lower extremities, hands, feet, fingers, toes, fingernails, and toenails. All findings within normal limits unless otherwise noted below.   Relevant physical exam findings are noted in the Assessment and Plan.  face,scalp x 9 (9) Erythematous thin papules/macules with gritty scale.   Assessment & Plan   SKIN CANCER SCREENING PERFORMED TODAY.  ACTINIC DAMAGE - Chronic condition, secondary to cumulative UV/sun exposure - diffuse scaly erythematous macules with underlying dyspigmentation - Recommend daily broad spectrum sunscreen SPF 30+ to sun-exposed areas, reapply every 2 hours as needed.  - Staying in the shade or wearing long sleeves, sun glasses (UVA+UVB protection) and wide brim hats (4-inch brim around the entire circumference of the hat) are also recommended for sun protection.  - Call for new or changing lesions.  LENTIGINES, SEBORRHEIC KERATOSES, HEMANGIOMAS - Benign normal skin lesions - Benign-appearing - Call for any changes  MELANOCYTIC NEVI - Tan-brown and/or  pink-flesh-colored symmetric macules and papules - Benign appearing on exam today - Observation - Call clinic for new or changing moles - Recommend daily use of broad spectrum spf 30+ sunscreen to sun-exposed areas.   ATOPIC DERMATITIS Exam: Low back sacral area  rough patch with inflammation  Chronic and persistent condition with duration or expected duration over one year. Condition is symptomatic/ bothersome to patient. Not currently at goal.  Atopic dermatitis (eczema) is a chronic, relapsing, pruritic condition that can significantly affect quality of life. It is often associated with allergic rhinitis and/or asthma and can require treatment with topical medications, phototherapy, or in severe cases biologic injectable medication (Dupixent; Adbry) or Oral JAK inhibitors. Treatment Plan: Start Mometasone  cream apply to affected skin qd-bid prn   Recommend gentle skin care.    HISTORY OF DYSPLASTIC NEVUS No evidence of recurrence today Recommend regular full body skin exams Recommend daily broad spectrum sunscreen SPF 30+ to sun-exposed areas, reapply every 2 hours as needed.  Call if any new or changing lesions are noted between office visits    AK (ACTINIC KERATOSIS) (9) face,scalp x 9 (9) ACTINIC DAMAGE - chronic, secondary to cumulative UV radiation exposure/sun exposure over time - diffuse scaly erythematous macules with underlying dyspigmentation - Recommend daily broad spectrum sunscreen SPF 30+ to sun-exposed areas, reapply every 2 hours as needed.  - Recommend staying in the shade or wearing long sleeves, sun glasses (UVA+UVB protection) and wide brim hats (4-inch brim around the entire circumference of the hat). - Call for new or changing lesions.  Destruction of lesion - face,scalp x 9 (9) Complexity: simple   Destruction method: cryotherapy   Informed consent: discussed and consent obtained  Timeout:  patient name, date of birth, surgical site, and procedure  verified Lesion destroyed using liquid nitrogen: Yes   Region frozen until ice ball extended beyond lesion: Yes   Outcome: patient tolerated procedure well with no complications   Post-procedure details: wound care instructions given    SKIN CANCER SCREENING   ACTINIC SKIN DAMAGE   LENTIGO   MELANOCYTIC NEVUS, UNSPECIFIED LOCATION   HISTORY OF DYSPLASTIC NEVUS   OTHER ATOPIC DERMATITIS   COUNSELING AND COORDINATION OF CARE   MEDICATION MANAGEMENT     Return in about 1 year (around 06/04/2025) for TBSE, hx of Dysplastic nevus .  IFay Stone, CMA, am acting as scribe for Alm Rhyme, MD .   Documentation: I have reviewed the above documentation for accuracy and completeness, and I agree with the above.  Alm Rhyme, MD

## 2024-06-08 ENCOUNTER — Encounter: Payer: Self-pay | Admitting: Dermatology

## 2024-06-29 ENCOUNTER — Ambulatory Visit

## 2024-06-29 DIAGNOSIS — L2084 Intrinsic (allergic) eczema: Secondary | ICD-10-CM

## 2024-06-29 DIAGNOSIS — L209 Atopic dermatitis, unspecified: Secondary | ICD-10-CM

## 2024-06-29 MED ORDER — TRIAMCINOLONE ACETONIDE 0.1 % EX OINT: TOPICAL_OINTMENT | CUTANEOUS | 2 refills | Status: AC

## 2024-06-29 NOTE — Patient Instructions (Signed)

## 2024-06-29 NOTE — Progress Notes (Signed)
°  °  Subjective   Derrick Stone is a 73 y.o. male who presents for the following: Rash. Patient is established patient   Today patient reports: Rash on the lower back, upper and lower extremities. Has been using compounded cream x2 weeks w/o improvement.   Review of Systems:    No other skin or systemic complaints except as noted in HPI or Assessment and Plan.  The following portions of the chart were reviewed this encounter and updated as appropriate: medications, allergies, medical history  Relevant Medical History:  n/a   Objective  (SKPE) Well appearing patient in no apparent distress; mood and affect are within normal limits. Examination was performed of the: Focused Exam of: Upper extremities, back   Examination notable for: Atopic dermatitis: Diffuse xerosis with erythematous plaques on the on the trunk and extremities, most prominently in the flexural areas with moderate lichenification, erythema, and pigment alteration focally - arms and low back  Examination limited by: Clothing and Patient deferred removal       Assessment & Plan  (SKAP)   Atopic dermatitis - mild, BSA <5%  Chronic and persistent condition with duration or expected duration over one year. Condition is symptomatic and bothersome to patient. Patient is flaring and not currently at treatment goal.   - Diagnosis, treatment options, prognosis, risk/ benefit, and side effects of treatment were discussed with the patient.  - Reviewed benign but chronic nature of disease. - Discussed dry skin care at length, recommended avoidance of fragrances, short showers with luke- warm water, no scrubbing, an unscented moisturizing soap (e.g. Dove sensitive skin) limited to the groin and axillae, and frequent emollient use (Eucerin, Aquaphor, Cerave, Vanicream, Vaseline). - Discussed treatment with topical steroids, non steroidal topicals, systemics (dupixent, tralokinumab, nemolizumab, rinvoq)  - Reviewed proper use of topical  steroids to minimize the risk of steroid-induced skin changes.  - Also discussed appropriate dry skin care including daily warm baths with gentle soap, followed by liberal bland moisturizer application.  - A patient education handout reiterating this information was provided. - has been using metronidazole cream - advised this is not a cream used for eczema.   - For mild areas: start triamcinolone  0.1% ointment twice daily  Procedures, orders, diagnosis for this visit:    There are no diagnoses linked to this encounter.  Return to clinic: Return if symptoms worsen or fail to improve.  I, Emerick Ege, CMA am acting as scribe for Lauraine JAYSON Kanaris, MD.   Documentation: I have reviewed the above documentation for accuracy and completeness, and I agree with the above.  Lauraine JAYSON Kanaris, MD

## 2024-07-14 ENCOUNTER — Ambulatory Visit: Payer: Self-pay

## 2024-07-14 ENCOUNTER — Telehealth: Admitting: Family Medicine

## 2024-07-14 DIAGNOSIS — J069 Acute upper respiratory infection, unspecified: Secondary | ICD-10-CM

## 2024-07-14 MED ORDER — PROMETHAZINE-DM 6.25-15 MG/5ML PO SYRP: 5.0000 mL | ORAL_SOLUTION | Freq: Four times a day (QID) | ORAL | 0 refills | Status: AC | PRN

## 2024-07-14 MED ORDER — AMOXICILLIN 875 MG PO TABS: 875.0000 mg | ORAL_TABLET | Freq: Two times a day (BID) | ORAL | 0 refills | Status: AC

## 2024-07-14 NOTE — Patient Instructions (Signed)

## 2024-07-14 NOTE — Progress Notes (Signed)
 " Virtual Visit Consent   Derrick Stone, you are scheduled for a virtual visit with a [facility] provider today. Just as with appointments in the office, your consent must be obtained to participate. Your consent will be active for this visit and any virtual visit you may have with one of our providers in the next 365 days. If you have a MyChart account, a copy of this consent can be sent to you electronically.  As this is a virtual visit, video technology does not allow for your provider to perform a traditional examination. This may limit your provider's ability to fully assess your condition. If your provider identifies any concerns that need to be evaluated in person or the need to arrange testing (such as labs, EKG, etc.), we will make arrangements to do so. Although advances in technology are sophisticated, we cannot ensure that it will always work on either your end or our end. If the connection with a video visit is poor, the visit may have to be switched to a telephone visit. With either a video or telephone visit, we are not always able to ensure that we have a secure connection.  By engaging in this virtual visit, you consent to the provision of healthcare and authorize for your insurance to be billed (if applicable) for the services provided during this visit. Depending on your insurance coverage, you may receive a charge related to this service.  I need to obtain your verbal consent now. Are you willing to proceed with your visit today? Derrick Stone has provided verbal consent on 07/14/2024 for a virtual visit (video or telephone). Loa Lamp, FNP  Date: 07/14/2024 1:16 PM   Virtual Visit via Video Note   I, Loa Lamp, connected with  Derrick Stone  ([id number], [birth year]) on 07/14/2024 at 12:30 PM EST by a video-enabled telemedicine application and verified that I am speaking with the correct person using two identifiers.  Location: Patient: Virtual Visit Location Patient:  Home Provider: Virtual Visit Location Provider: Home Office   I discussed the limitations of evaluation and management by telemedicine and the availability of in person appointments. The patient expressed understanding and agreed to proceed.    History of Present Illness: Derrick Stone is a 73 y.o. who identifies as a male who was assigned male at birth, and is being seen today for cold sx for cough, no fever, yellow mucus, headaches, head congestion, aching, on mucinex and advil, no wheezing or sob. Sx for 3-4 days.   HPI: HPI  Problems:  Patient Active Problem List   Diagnosis Date Noted   Anxiousness 12/06/2021   Leukocytosis 09/20/2020   NSTEMI (non-ST elevated myocardial infarction) (HCC) 09/19/2020   Pre-diabetes 04/13/2017   Stricture and stenosis of esophagus 04/10/2017   Primary hypertension 10/09/2013   Pure hypercholesterolemia 11/27/2006   Healed myocardial infarct 11/27/2006   Coronary atherosclerosis of native coronary artery 11/11/2006   Acid reflux 07/17/2003    Allergies: Allergies[1] Medications: Current Medications[2]  Observations/Objective: Patient is well-developed, well-nourished in no acute distress.  Resting comfortably  at home.  Head is normocephalic, atraumatic.  No labored breathing.  Speech is clear and coherent with logical content.  Patient is alert and oriented at baseline.    Assessment and Plan: 1. Viral upper respiratory tract infection (Primary)  Increase fluids, continue mucinex, UC as needed. Discussed this as viral illness and to hold atb for 2-3 days and start if sx persist or worsen.   Follow Up  Instructions: I discussed the assessment and treatment plan with the patient. The patient was provided an opportunity to ask questions and all were answered. The patient agreed with the plan and demonstrated an understanding of the instructions.  A copy of instructions were sent to the patient via MyChart unless otherwise noted below.      The patient was advised to call back or seek an in-person evaluation if the symptoms worsen or if the condition fails to improve as anticipated.    Faria Casella, FNP     [1]  Allergies Allergen Reactions   Gold-Containing Drug Products    Nitroglycerin  Other (See Comments)    Passed out when given to him once  [2]  Current Outpatient Medications:    promethazine-dextromethorphan (PROMETHAZINE-DM) 6.25-15 MG/5ML syrup, Take 5 mLs by mouth 4 (four) times daily as needed for up to 10 days for cough., Disp: 118 mL, Rfl: 0   aspirin  EC 81 MG tablet, Take 81 mg by mouth daily., Disp: , Rfl:    atorvastatin  (LIPITOR ) 80 MG tablet, Take 1 tablet (80 mg total) by mouth daily., Disp: 90 tablet, Rfl: 1   buPROPion (WELLBUTRIN XL) 150 MG 24 hr tablet, Take 75 mg by mouth daily. Patient can't tolerate 150mg  daily., Disp: , Rfl:    clopidogrel  (PLAVIX ) 75 MG tablet, Take 1 tablet (75 mg total) by mouth daily., Disp: 30 tablet, Rfl: 0   escitalopram  (LEXAPRO ) 10 MG tablet, TAKE 1 TABLET (10 MG TOTAL) BY MOUTH ONCE DAILY., Disp: , Rfl: 1   ezetimibe  (ZETIA ) 10 MG tablet, TAKE 1 TABLET BY MOUTH EVERY DAY, Disp: 90 tablet, Rfl: 3   fluticasone (FLONASE) 50 MCG/ACT nasal spray, Place into both nostrils., Disp: , Rfl:    losartan  (COZAAR ) 25 MG tablet, Take 1 tablet (25 mg total) by mouth daily., Disp: 30 tablet, Rfl: 1   metoprolol  succinate (TOPROL -XL) 25 MG 24 hr tablet, Take 1 tablet by mouth daily., Disp: , Rfl:    mometasone  (ELOCON ) 0.1 % cream, Apply to affected rash on body qd-bid prn, Disp: 45 g, Rfl: 6   Omega-3 Fatty Acids (FISH OIL PO), Take 1 capsule by mouth daily., Disp: , Rfl:    omeprazole  (PRILOSEC ) 20 MG capsule, Take 1 tablet by mouth daily., Disp: , Rfl:    triamcinolone  ointment (KENALOG ) 0.1 %, Apply 7 grams twice daily to affected areas of skin. Stop once resolved and restart as needed for flares. Avoid use on face, armpits, groin unless otherwise indicated., Disp: 454 g, Rfl:  2  "

## 2024-07-14 NOTE — Telephone Encounter (Signed)
 FYI Only or Action Required?: Action required by provider: request for appointment.  Patient was last seen in primary care on 02/03/2024 by Derrick Nancyann BRAVO, MD.  Called Nurse Triage reporting Cough.  Symptoms began several days ago.  Interventions attempted: OTC medications: Mucinex.  Symptoms are: gradually worsening.  Triage Disposition: See Physician Within 24 Hours  Cough, body aches, runny nose.  Patient/caregiver understaTriage Disposition: See Physician Within 24 Hours  Patient/caregiver understands and will follow disposition?:        Copied from CRM #[id number]. Topic: Clinical - Red Word Triage >> Jul 14, 2024 12:11 PM Antwanette L wrote: Red Word that prompted transfer to Nurse Triage: The patient has been experiencing a cough with yellow mucus, nasal congestion, and body aches for the past three day Answer Assessment - Initial Assessment Questions 1. ONSET: When did the cough begin?      3 days 2. SEVERITY: How bad is the cough today?      severe 3. SPUTUM: Describe the color of your sputum (e.g., none, dry cough; clear, white, yellow, green)     yellow 4. HEMOPTYSIS: Are you coughing up any blood? If Yes, ask: How much? (e.g., flecks, streaks, tablespoons, etc.)     no 5. DIFFICULTY BREATHING: Are you having difficulty breathing? If Yes, ask: How bad is it? (e.g., mild, moderate, severe)      no 6. FEVER: Do you have a fever? If Yes, ask: What is your temperature, how was it measured, and when did it start?     yes 7. CARDIAC HISTORY: Do you have any history of heart disease? (e.g., heart attack, congestive heart failure)      yes 8. LUNG HISTORY: Do you have any history of lung disease?  (e.g., pulmonary embolus, asthma, emphysema)     no 9. PE RISK FACTORS: Do you have a history of blood clots? (or: recent major surgery, recent prolonged travel, bedridden)     no 10. OTHER SYMPTOMS: Do you have any other symptoms? (e.g., runny nose,  wheezing, chest pain)       Body aches 11. PREGNANCY: Is there any chance you are pregnant? When was your last menstrual period?       N/a 12. TRAVEL: Have you traveled out of the country in the last month? (e.g., travel history, exposures)       no  Protocols used: Cough - Acute Productive-A-AH

## 2025-01-06 ENCOUNTER — Ambulatory Visit

## 2025-02-03 ENCOUNTER — Encounter: Admitting: Family Medicine

## 2025-06-16 ENCOUNTER — Ambulatory Visit: Admitting: Dermatology
